# Patient Record
Sex: Male | Born: 1955
Health system: Southern US, Community
[De-identification: ages and names within clinical notes are randomized; demographics above are authoritative.]

## PROBLEM LIST (undated history)

## (undated) ENCOUNTER — Emergency Department (HOSPITAL_BASED_OUTPATIENT_CLINIC_OR_DEPARTMENT_OTHER): Payer: Medicare Other

## (undated) DIAGNOSIS — R55 Syncope and collapse: Secondary | ICD-10-CM

## (undated) DIAGNOSIS — F32A Depression, unspecified: Secondary | ICD-10-CM

## (undated) DIAGNOSIS — N399 Disorder of urinary system, unspecified: Secondary | ICD-10-CM

## (undated) DIAGNOSIS — K219 Gastro-esophageal reflux disease without esophagitis: Secondary | ICD-10-CM

## (undated) DIAGNOSIS — J189 Pneumonia, unspecified organism: Secondary | ICD-10-CM

## (undated) DIAGNOSIS — R51 Headache: Secondary | ICD-10-CM

## (undated) DIAGNOSIS — R519 Headache, unspecified: Secondary | ICD-10-CM

## (undated) DIAGNOSIS — E7800 Pure hypercholesterolemia, unspecified: Secondary | ICD-10-CM

## (undated) DIAGNOSIS — C801 Malignant (primary) neoplasm, unspecified: Secondary | ICD-10-CM

## (undated) DIAGNOSIS — IMO0002 Reserved for concepts with insufficient information to code with codable children: Secondary | ICD-10-CM

## (undated) DIAGNOSIS — I219 Acute myocardial infarction, unspecified: Secondary | ICD-10-CM

## (undated) DIAGNOSIS — N529 Male erectile dysfunction, unspecified: Secondary | ICD-10-CM

## (undated) DIAGNOSIS — E559 Vitamin D deficiency, unspecified: Secondary | ICD-10-CM

## (undated) DIAGNOSIS — I10 Essential (primary) hypertension: Secondary | ICD-10-CM

## (undated) DIAGNOSIS — IMO0001 Reserved for inherently not codable concepts without codable children: Secondary | ICD-10-CM

## (undated) DIAGNOSIS — I2510 Atherosclerotic heart disease of native coronary artery without angina pectoris: Secondary | ICD-10-CM

## (undated) HISTORY — DX: Male erectile dysfunction, unspecified: N52.9

## (undated) HISTORY — DX: Syncope and collapse: R55

## (undated) HISTORY — DX: Depression, unspecified: F32.A

## (undated) HISTORY — DX: Essential (primary) hypertension: I10

## (undated) HISTORY — PX: HERNIA REPAIR: SHX51

## (undated) HISTORY — DX: Malignant (primary) neoplasm, unspecified: C80.1

## (undated) HISTORY — DX: Disorder of urinary system, unspecified: N39.9

## (undated) HISTORY — DX: Pneumonia, unspecified organism: J18.9

## (undated) HISTORY — DX: Vitamin D deficiency, unspecified: E55.9

## (undated) HISTORY — PX: TOTAL HIP ARTHROPLASTY: SHX124

## (undated) HISTORY — DX: Reserved for concepts with insufficient information to code with codable children: IMO0002

## (undated) HISTORY — DX: Gastro-esophageal reflux disease without esophagitis: K21.9

## (undated) HISTORY — PX: APPENDECTOMY: SHX54

---

## 2000-06-22 ENCOUNTER — Encounter: Admission: RE | Admit: 2000-06-22 | Discharge: 2000-07-03 | Payer: Self-pay | Admitting: Family Medicine

## 2005-10-10 ENCOUNTER — Ambulatory Visit: Payer: Self-pay | Admitting: Cardiology

## 2005-11-09 ENCOUNTER — Ambulatory Visit (HOSPITAL_COMMUNITY): Admission: RE | Admit: 2005-11-09 | Discharge: 2005-11-09 | Payer: Self-pay | Admitting: Family Medicine

## 2007-07-06 ENCOUNTER — Ambulatory Visit (HOSPITAL_BASED_OUTPATIENT_CLINIC_OR_DEPARTMENT_OTHER): Admission: RE | Admit: 2007-07-06 | Discharge: 2007-07-06 | Payer: Self-pay | Admitting: *Deleted

## 2009-05-11 ENCOUNTER — Ambulatory Visit: Payer: Self-pay | Admitting: Gastroenterology

## 2009-05-19 ENCOUNTER — Ambulatory Visit: Payer: Self-pay | Admitting: Gastroenterology

## 2010-12-14 DIAGNOSIS — R55 Syncope and collapse: Secondary | ICD-10-CM | POA: Insufficient documentation

## 2010-12-14 DIAGNOSIS — R079 Chest pain, unspecified: Secondary | ICD-10-CM | POA: Insufficient documentation

## 2010-12-15 ENCOUNTER — Institutional Professional Consult (permissible substitution): Payer: Self-pay | Admitting: Cardiology

## 2010-12-15 DIAGNOSIS — R0989 Other specified symptoms and signs involving the circulatory and respiratory systems: Secondary | ICD-10-CM

## 2011-01-13 ENCOUNTER — Encounter: Payer: Self-pay | Admitting: Nurse Practitioner

## 2011-01-13 DIAGNOSIS — E559 Vitamin D deficiency, unspecified: Secondary | ICD-10-CM | POA: Insufficient documentation

## 2011-01-13 DIAGNOSIS — E7800 Pure hypercholesterolemia, unspecified: Secondary | ICD-10-CM | POA: Insufficient documentation

## 2011-01-13 DIAGNOSIS — N529 Male erectile dysfunction, unspecified: Secondary | ICD-10-CM

## 2011-01-13 DIAGNOSIS — F17200 Nicotine dependence, unspecified, uncomplicated: Secondary | ICD-10-CM

## 2011-01-13 DIAGNOSIS — E785 Hyperlipidemia, unspecified: Secondary | ICD-10-CM | POA: Insufficient documentation

## 2011-01-13 DIAGNOSIS — Z87891 Personal history of nicotine dependence: Secondary | ICD-10-CM | POA: Insufficient documentation

## 2011-01-13 DIAGNOSIS — I10 Essential (primary) hypertension: Secondary | ICD-10-CM

## 2011-03-08 NOTE — Op Note (Signed)
NAMECLAUDIS, Paul Murillo                ACCOUNT NO.:  [id number]   MEDICAL RECORD NO.:  [id number]          PATIENT TYPE:  AMB   LOCATION:  [facility]                         FACILITY:  [facility]   PHYSICIAN:  Alfonse Ras, MD   DATE OF BIRTH:  Feb 22, 1956   DATE OF PROCEDURE:  07/06/2007  DATE OF DISCHARGE:                               OPERATIVE REPORT   PREOPERATIVE DIAGNOSIS:  Left inguinal hernia.   POSTOPERATIVE DIAGNOSIS:  Left inguinal hernia, indirect hernia.   PROCEDURES:  Left inguinal hernia repair with mesh, Atrium mesh.   ANESTHESIA:  General.   DESCRIPTION OF PROCEDURE:  The patient was taken to the operating room  and placed in supine position.  After adequate general anesthesia was  induced using endotracheal tube, the bilateral groins were prepped and  draped in the normal sterile fashion.  Using an oblique incision over  the inguinal canal, I dissected down through the subcutaneous fat onto  the external oblique fascia.  This was opened along its fibers down to  the external ring.  The ilioinguinal nerve was identified and retracted  superiorly.  Blunt dissection identified the transversalis fascia as  well as the Cooper ligament and shelving edge of the inguinal ligament.  A large amount of preperitoneal fat along the cord was identified and  resected.  An indirect hernia sac was identified and opened.  There were  no internal contents.  It was ligated high up at the internal ring with  a pursestring 0 Surgilon suture.  The floor of Hesselbach triangle was  somewhat weak, but was reinforced with interrupted #1 Surgilon from the  transversalis fascia to [employer] ligament into the inguinal ligament.  A  piece of onlay Atrium 1 x 4 mesh was then placed over the repair, split,  and brought out lateral to the internal ring.  This was tacked in place  with a running 2-0 Prolene suture from the pubic tubercle along the  inguinal ligament lateral to the internal ring and superiorly  to the  transversalis fascia.  Ilioinguinal nerve was replaced in its normal  anatomic position.  Tissues were injected with 0.5 Marcaine.  The  external oblique fascia was closed with a running 3-0 Vicryl suture.  Skin incision was closed with staples.  Sterile dressing was applied.  The patient tolerated the procedure well and went to PACU in good  condition.      Alfonse Ras, MD  Electronically Signed     KRE/MEDQ  D:  07/06/2007  T:  07/07/2007  Job:  [id number]

## 2011-04-04 ENCOUNTER — Encounter: Payer: Self-pay | Admitting: Cardiology

## 2011-08-05 LAB — POCT I-STAT 4, (NA,K, GLUC, HGB,HCT)
Glucose, Bld: 91
HCT: 47
Hemoglobin: 16
Operator id: 114531
Potassium: 4.4
Sodium: 149 — ABNORMAL HIGH

## 2012-10-24 ENCOUNTER — Emergency Department (HOSPITAL_BASED_OUTPATIENT_CLINIC_OR_DEPARTMENT_OTHER): Payer: 59

## 2012-10-24 ENCOUNTER — Encounter (HOSPITAL_BASED_OUTPATIENT_CLINIC_OR_DEPARTMENT_OTHER): Payer: Self-pay | Admitting: *Deleted

## 2012-10-24 ENCOUNTER — Emergency Department (HOSPITAL_BASED_OUTPATIENT_CLINIC_OR_DEPARTMENT_OTHER)
Admission: EM | Admit: 2012-10-24 | Discharge: 2012-10-24 | Disposition: A | Discharge status: Home / Self Care | Payer: 59 | Attending: Emergency Medicine | Admitting: Emergency Medicine

## 2012-10-24 DIAGNOSIS — S39012A Strain of muscle, fascia and tendon of lower back, initial encounter: Secondary | ICD-10-CM

## 2012-10-24 DIAGNOSIS — Z79899 Other long term (current) drug therapy: Secondary | ICD-10-CM | POA: Insufficient documentation

## 2012-10-24 DIAGNOSIS — Z7982 Long term (current) use of aspirin: Secondary | ICD-10-CM | POA: Insufficient documentation

## 2012-10-24 DIAGNOSIS — Z87891 Personal history of nicotine dependence: Secondary | ICD-10-CM | POA: Insufficient documentation

## 2012-10-24 DIAGNOSIS — I252 Old myocardial infarction: Secondary | ICD-10-CM | POA: Insufficient documentation

## 2012-10-24 DIAGNOSIS — I2510 Atherosclerotic heart disease of native coronary artery without angina pectoris: Secondary | ICD-10-CM | POA: Insufficient documentation

## 2012-10-24 DIAGNOSIS — S335XXA Sprain of ligaments of lumbar spine, initial encounter: Secondary | ICD-10-CM | POA: Insufficient documentation

## 2012-10-24 DIAGNOSIS — E7800 Pure hypercholesterolemia, unspecified: Secondary | ICD-10-CM | POA: Insufficient documentation

## 2012-10-24 DIAGNOSIS — Y92410 Unspecified street and highway as the place of occurrence of the external cause: Secondary | ICD-10-CM | POA: Insufficient documentation

## 2012-10-24 DIAGNOSIS — Y9389 Activity, other specified: Secondary | ICD-10-CM | POA: Insufficient documentation

## 2012-10-24 DIAGNOSIS — Z9889 Other specified postprocedural states: Secondary | ICD-10-CM | POA: Insufficient documentation

## 2012-10-24 DIAGNOSIS — K219 Gastro-esophageal reflux disease without esophagitis: Secondary | ICD-10-CM | POA: Insufficient documentation

## 2012-10-24 HISTORY — DX: Pure hypercholesterolemia, unspecified: E78.00

## 2012-10-24 HISTORY — DX: Reserved for inherently not codable concepts without codable children: IMO0001

## 2012-10-24 HISTORY — DX: Acute myocardial infarction, unspecified: I21.9

## 2012-10-24 HISTORY — DX: Gastro-esophageal reflux disease without esophagitis: K21.9

## 2012-10-24 HISTORY — DX: Atherosclerotic heart disease of native coronary artery without angina pectoris: I25.10

## 2012-10-24 MED ORDER — HYDROCODONE-ACETAMINOPHEN 5-325 MG PO TABS
ORAL_TABLET | ORAL | Status: AC
  Administered 2012-10-24: 1 via ORAL
  Filled 2012-10-24: qty 1

## 2012-10-24 MED ORDER — HYDROCODONE-ACETAMINOPHEN 5-325 MG PO TABS
1.0000 | ORAL_TABLET | Freq: Once | ORAL | Status: AC
  Administered 2012-10-24: 1 via ORAL

## 2012-10-24 MED ORDER — HYDROCODONE-ACETAMINOPHEN 5-325 MG PO TABS: 2.0000 | ORAL_TABLET | ORAL | Status: DC | PRN

## 2012-10-24 NOTE — ED Provider Notes (Signed)
Medical screening examination/treatment/procedure(s) were performed by non-physician practitioner and as supervising physician I was immediately available for consultation/collaboration.   Khristian Y. Krrish Freund, MD 10/24/12 2338 
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
## 2013-05-09 ENCOUNTER — Other Ambulatory Visit: Payer: Self-pay | Admitting: Nurse Practitioner

## 2013-07-25 ENCOUNTER — Other Ambulatory Visit: Payer: Self-pay | Admitting: Family Medicine

## 2013-07-29 NOTE — Telephone Encounter (Signed)
You may refill this prescription for one time only, patient will need to make an appointment to be seen

## 2013-07-29 NOTE — Telephone Encounter (Signed)
Last seen 12/18/12  ACM

## 2013-09-15 ENCOUNTER — Other Ambulatory Visit: Payer: Self-pay | Admitting: Family Medicine

## 2013-10-11 ENCOUNTER — Other Ambulatory Visit: Payer: Self-pay | Admitting: Family Medicine

## 2013-11-19 ENCOUNTER — Ambulatory Visit (INDEPENDENT_AMBULATORY_CARE_PROVIDER_SITE_OTHER): Payer: 59 | Admitting: Family Medicine

## 2013-11-19 ENCOUNTER — Encounter: Payer: Self-pay | Admitting: Family Medicine

## 2013-11-19 VITALS — BP 133/88 | HR 73 | Temp 97.9°F | Ht 73.0 in | Wt 194.0 lb

## 2013-11-19 DIAGNOSIS — R05 Cough: Secondary | ICD-10-CM

## 2013-11-19 DIAGNOSIS — R059 Cough, unspecified: Secondary | ICD-10-CM

## 2013-11-19 DIAGNOSIS — J209 Acute bronchitis, unspecified: Secondary | ICD-10-CM

## 2013-11-19 LAB — POCT INFLUENZA A/B
Influenza A, POC: NEGATIVE
Influenza B, POC: NEGATIVE

## 2013-11-19 MED ORDER — LOSARTAN POTASSIUM-HCTZ 100-12.5 MG PO TABS: 1.0000 | ORAL_TABLET | Freq: Every day | ORAL | Status: DC

## 2013-11-19 MED ORDER — AZITHROMYCIN 250 MG PO TABS: ORAL_TABLET | ORAL | Status: DC

## 2013-11-19 MED ORDER — BENZONATATE 100 MG PO CAPS: 100.0000 mg | ORAL_CAPSULE | Freq: Three times a day (TID) | ORAL | Status: DC | PRN

## 2013-11-19 NOTE — Patient Instructions (Signed)

## 2013-11-19 NOTE — Progress Notes (Signed)
   Subjective:    Patient ID: Haynes Giannotti, male    DOB: 04-Dec-1955, 58 y.o.   MRN: [id number]  HPI  This 58 y.o. male presents for evaluation of cough and congestion and URI sx's.  Review of Systems C/o Uri sx's No chest pain, SOB, HA, dizziness, vision change, N/V, diarrhea, constipation, dysuria, urinary urgency or frequency, myalgias, arthralgias or rash.     Objective:   Physical Exam Vital signs noted  Well developed well nourished male.  HEENT - Head atraumatic Normocephalic                Eyes - PERRLA, Conjuctiva - clear Sclera- Clear EOMI                Ears - EAC's Wnl TM's Wnl Gross Hearing WNL                Nose - Nares patent                 Throat - oropharanx wnl Respiratory - Lungs CTA bilateral Cardiac - RRR S1 and S2 without murmur        Assessment & Plan:  Cough - Plan: POCT Influenza A/B, azithromycin (ZITHROMAX) 250 MG tablet, benzonatate (TESSALON PERLES) 100 MG capsule  Acute bronchitis - Plan: azithromycin (ZITHROMAX) 250 MG tablet, benzonatate (TESSALON PERLES) 100 MG . Push po fluids, rest, tylenol and motrin otc prn as directed for fever, arthralgias, and myalgias.  Follow up prn if sx's continue or persist.  Lysbeth Penner FNP

## 2013-12-20 ENCOUNTER — Encounter: Payer: 59 | Admitting: Family Medicine

## 2014-01-01 ENCOUNTER — Ambulatory Visit (INDEPENDENT_AMBULATORY_CARE_PROVIDER_SITE_OTHER): Payer: 59 | Admitting: Family Medicine

## 2014-01-01 ENCOUNTER — Ambulatory Visit (INDEPENDENT_AMBULATORY_CARE_PROVIDER_SITE_OTHER): Payer: 59

## 2014-01-01 ENCOUNTER — Encounter: Payer: Self-pay | Admitting: Family Medicine

## 2014-01-01 ENCOUNTER — Encounter (INDEPENDENT_AMBULATORY_CARE_PROVIDER_SITE_OTHER): Payer: Self-pay

## 2014-01-01 VITALS — BP 158/99 | HR 88 | Temp 98.8°F | Ht 73.0 in | Wt 199.0 lb

## 2014-01-01 DIAGNOSIS — R5383 Other fatigue: Secondary | ICD-10-CM

## 2014-01-01 DIAGNOSIS — N529 Male erectile dysfunction, unspecified: Secondary | ICD-10-CM

## 2014-01-01 DIAGNOSIS — R079 Chest pain, unspecified: Secondary | ICD-10-CM

## 2014-01-01 DIAGNOSIS — R32 Unspecified urinary incontinence: Secondary | ICD-10-CM

## 2014-01-01 DIAGNOSIS — E785 Hyperlipidemia, unspecified: Secondary | ICD-10-CM

## 2014-01-01 DIAGNOSIS — R61 Generalized hyperhidrosis: Secondary | ICD-10-CM

## 2014-01-01 DIAGNOSIS — K625 Hemorrhage of anus and rectum: Secondary | ICD-10-CM

## 2014-01-01 DIAGNOSIS — R5381 Other malaise: Secondary | ICD-10-CM

## 2014-01-01 DIAGNOSIS — Z0000 Encounter for general adult medical examination without abnormal findings: Secondary | ICD-10-CM

## 2014-01-01 DIAGNOSIS — I2510 Atherosclerotic heart disease of native coronary artery without angina pectoris: Secondary | ICD-10-CM

## 2014-01-01 LAB — POCT CBC
Granulocyte percent: 69.3 %G (ref 37–80)
HCT, POC: 41.3 % — AB (ref 43.5–53.7)
Hemoglobin: 13.5 g/dL — AB (ref 14.1–18.1)
Lymph, poc: 2.2 (ref 0.6–3.4)
MCH, POC: 31.2 pg (ref 27–31.2)
MCHC: 32.8 g/dL (ref 31.8–35.4)
MCV: 95.3 fL (ref 80–97)
MPV: 7.3 fL (ref 0–99.8)
POC Granulocyte: 6 (ref 2–6.9)
POC LYMPH PERCENT: 25 %L (ref 10–50)
Platelet Count, POC: 206 10*3/uL (ref 142–424)
RBC: 4.3 M/uL — AB (ref 4.69–6.13)
RDW, POC: 14.4 %
WBC: 8.7 10*3/uL (ref 4.6–10.2)

## 2014-01-01 LAB — POCT URINALYSIS DIPSTICK
Bilirubin, UA: NEGATIVE
Blood, UA: NEGATIVE
Glucose, UA: NEGATIVE
Ketones, UA: NEGATIVE
Leukocytes, UA: NEGATIVE
Nitrite, UA: NEGATIVE
Protein, UA: NEGATIVE
Spec Grav, UA: 1.01
Urobilinogen, UA: NEGATIVE
pH, UA: 6

## 2014-01-01 LAB — POCT UA - MICROSCOPIC ONLY
Bacteria, U Microscopic: NEGATIVE
Casts, Ur, LPF, POC: NEGATIVE
Crystals, Ur, HPF, POC: NEGATIVE
Mucus, UA: NEGATIVE
RBC, urine, microscopic: NEGATIVE
WBC, Ur, HPF, POC: NEGATIVE
Yeast, UA: NEGATIVE

## 2014-01-01 MED ORDER — TAMSULOSIN HCL 0.4 MG PO CAPS: 0.4000 mg | ORAL_CAPSULE | Freq: Every day | ORAL | Status: DC

## 2014-01-01 NOTE — Progress Notes (Signed)
Subjective:    Patient ID: Paul Murillo, male    DOB: [birth year], 58 y.o.   MRN: [id number]  HPI This 58 y.o. male presents for evaluation of CPE.  He has hx of hypertension and ED.  He  Has hx of tobacco abuse and vitamin D deficiency.  He c/o mild chest discomfort in his right chest For 2 weeks and does increase a little with activity.  He is tired all the time.  He has hx of coronary artery disease and hx of 3 coronary artery stents.  He has night sweats.  He wants to sleep all the time. He denies fever or mucopurulent productive cough.  He has ED and was taking levitra. He has some Urinary incontinence.  He gets urinary urgency and then voids a little and then urinates little afterwards. He notices some rectal bleeding.   He has BM's that are with mucous and have blood in them and These stools or rectal bleeding have been happening every 2 weeks.  He feels like he has diarrhea when he has these BM's and all that comes out is blood.   Review of Systems C/o fatigue, ED, chest pain, fatigue, night sweats, urinary urgency and incontinence.   No SOB, HA, dizziness, vision change, N/V, diarrhea, constipation,  myalgias, arthralgias or rash.  Objective:   Physical Exam  Vital signs noted  Well developed well nourished male.  HEENT - Head atraumatic Normocephalic                Eyes - PERRLA, Conjuctiva - clear Sclera- Clear EOMI                Ears - EAC's Wnl TM's Wnl Gross Hearing WNL                Nose - Nares patent                 Throat - oropharanx wnl Respiratory - Lungs CTA bilateral Cardiac - RRR S1 and S2 without murmur GI - Abdomen soft Nontender and bowel sounds active x 4 Extremities - No edema. Neuro - Grossly intact.  EKG - No acute ST-T changes  Results for orders placed in visit on 01/01/14  POCT CBC      Result Value Ref Range   WBC 8.7  4.6 - 10.2 K/uL   Lymph, poc 2.2  0.6 - 3.4   POC LYMPH PERCENT 25.0  10 - 50 %L   POC Granulocyte 6.0  2 - 6.9   Granulocyte percent 69.3  37 - 80 %G   RBC 4.3 (*) 4.69 - 6.13 M/uL   Hemoglobin 13.5 (*) 14.1 - 18.1 g/dL   HCT, POC 41.3 (*) 43.5 - 53.7 %   MCV 95.3  80 - 97 fL   MCH, POC 31.2  27 - 31.2 pg   MCHC 32.8  31.8 - 35.4 g/dL   RDW, POC 14.4     Platelet Count, POC 206.0  142 - 424 K/uL   MPV 7.3  0 - 99.8 fL  POCT UA - MICROSCOPIC ONLY      Result Value Ref Range   WBC, Ur, HPF, POC neg     RBC, urine, microscopic neg     Bacteria, U Microscopic neg     Mucus, UA neg     Epithelial cells, urine per micros occ     Crystals, Ur, HPF, POC neg     Casts, Ur, LPF, POC neg  Yeast, UA neg    POCT URINALYSIS DIPSTICK      Result Value Ref Range   Color, UA yellow     Clarity, UA clear     Glucose, UA neg     Bilirubin, UA neg     Ketones, UA neg     Spec Grav, UA 1.010     Blood, UA neg     pH, UA 6.0     Protein, UA neg     Urobilinogen, UA negative     Nitrite, UA neg     Leukocytes, UA Negative        Assessment & Plan:  Routine general medical examination at a health care facility - Plan: POCT CBC, POCT UA - Microscopic Only, POCT urinalysis dipstick, Lipid panel, Vit D  25 hydroxy (rtn osteoporosis monitoring), Vitamin B12, Thyroid Panel With TSH, PSA, total and free, Sedimentation rate, CANCELED: POCT SEDIMENTATION RATE  CAD (coronary artery disease) - Plan: EKG 12-Lead, Ambulatory referral to Cardiology  Chest pain - Plan: DG Chest 2 View, EKG 12-Lead, Ambulatory referral to Cardiology  Other malaise and fatigue - Plan: Vitamin B12, Thyroid Panel With TSH, Sedimentation rate, Testosterone,Free and Total, CANCELED: POCT SEDIMENTATION RATE  Urinary incontinence - Plan: POCT UA - Microscopic Only, POCT urinalysis dipstick, Sedimentation rate, tamsulosin (FLOMAX) 0.4 MG CAPS capsule  Night sweats - Plan: POCT CBC, DG Chest 2 View, Sedimentation rate  Other and unspecified hyperlipidemia - Plan: Lipid panel, Sedimentation rate  Rectal bleeding - Plan: POCT CBC,  Ambulatory referral to Gastroenterology  ED (erectile dysfunction) - Plan: Testosterone,Free and Total Hold on ED meds.  He takes NTG on occasion for chest pain and doesn't need To be taking ED meds at this time. Explained this to patient.  Follow up in 2 weeks to follow up  [place]

## 2014-01-02 ENCOUNTER — Ambulatory Visit (INDEPENDENT_AMBULATORY_CARE_PROVIDER_SITE_OTHER): Payer: 59 | Admitting: Cardiology

## 2014-01-02 ENCOUNTER — Encounter: Payer: Self-pay | Admitting: Cardiology

## 2014-01-02 VITALS — BP 144/90 | HR 72 | Ht 73.0 in | Wt 197.0 lb

## 2014-01-02 DIAGNOSIS — R079 Chest pain, unspecified: Secondary | ICD-10-CM

## 2014-01-02 DIAGNOSIS — I2510 Atherosclerotic heart disease of native coronary artery without angina pectoris: Secondary | ICD-10-CM

## 2014-01-02 LAB — SEDIMENTATION RATE: Sed Rate: 28 mm/hr (ref 0–30)

## 2014-01-02 NOTE — Progress Notes (Signed)
Patient ID: Paul Murillo, male   DOB: [birth year], 58 y.o.   MRN: [id number]     Patient Name: Paul Murillo Date of Encounter: 01/02/2014  Primary Care Provider:  Redge Gainer, MD Primary Cardiologist:  Dorothy Spark  Problem List   Past Medical History  Diagnosis Date  . Coronary artery disease   . MI (myocardial infarction)   . Hypercholesteremia   . Reflux   . Hypertension   . Syncope   . Genitourinary disorder   . ED (erectile dysfunction)   . Vitamin D deficiency    Past Surgical History  Procedure Laterality Date  . Total hip arthroplasty    . Appendectomy    . Hernia repair      Allergies  No Known Allergies  HPI  A pleasant 58 year old gentleman with prior medical history of hypertension, hyperlipidemia and known coronary artery disease with myocardial infarction approximately 2 years ago. The patient was hospitalized at that time at [facility] and based on his report he received 3 stents, but he has no further information. He has been very compliant to his medication and to this date he is on dual antiplatelet therapy with aspirin and ticagrelor. He states that he has been asymptomatic since then until about 2 weeks ago when he started to experience profound fatigue and right-sided chest pain that can be exertional or nonexertional. He also admits to occasional palpitations that last about 10 minutes and can be associated with mild dizziness but no shortness of breath. He otherwise denies shortness of breath, orthopnea, paroxysmal nocturnal dyspnea or lower extremity edema. Since his stents placement he has been followed by his family physician we will also monitors his lipids he is currently on 40 mg of atorvastatin.  Home Medications  Prior to Admission medications   Medication Sig Start Date End Date Taking? Authorizing Provider  aspirin 81 MG EC tablet Take 81 mg by mouth daily.     Yes Historical Provider, MD  atorvastatin (LIPITOR) 40 MG  tablet Take 80 mg by mouth daily.    Yes Historical Provider, MD  losartan-hydrochlorothiazide (HYZAAR) 100-12.5 MG per tablet Take 1 tablet by mouth daily. 11/19/13  Yes Lysbeth Penner, FNP  NITROGLYCERIN SL Place under the tongue.   Yes Historical Provider, MD  pantoprazole (PROTONIX) 40 MG tablet Take 40 mg by mouth daily.   Yes Historical Provider, MD  prasugrel (EFFIENT) 10 MG TABS Take by mouth.   Yes Historical Provider, MD  vardenafil (LEVITRA) 20 MG tablet Take 20 mg by mouth daily as needed.     Yes Historical Provider, MD  tamsulosin (FLOMAX) 0.4 MG CAPS capsule Take 1 capsule (0.4 mg total) by mouth daily. 01/01/14   Lysbeth Penner, FNP    Family History  Family History  Problem Relation Age of Onset  . Heart disease Father   . Hypertension Father   . Diabetes Brother     Social History  History   Social History  . Marital Status: Married    Spouse Name: N/A    Number of Children: N/A  . Years of Education: N/A   Occupational History  . Not on file.   Social History Main Topics  . Smoking status: Former Research scientist (life sciences)  . Smokeless tobacco: Not on file  . Alcohol Use: Yes  . Drug Use: No  . Sexual Activity: Not on file   Other Topics Concern  . Not on file   Social History Narrative  . No narrative  on file     Review of Systems, as per HPI, otherwise negative General:  No chills, fever, night sweats or weight changes.  Cardiovascular:  No chest pain, dyspnea on exertion, edema, orthopnea, palpitations, paroxysmal nocturnal dyspnea. Dermatological: No rash, lesions/masses Respiratory: No cough, dyspnea Urologic: No hematuria, dysuria Abdominal:   No nausea, vomiting, diarrhea, bright red blood per rectum, melena, or hematemesis Neurologic:  No visual changes, wkns, changes in mental status. All other systems reviewed and are otherwise negative except as noted above.  Physical Exam  Blood pressure 144/90, pulse 72, height 6\' 1"  (1.854 m), weight 197 lb  (89.359 kg).  General: Pleasant, NAD Psych: Normal affect. Neuro: Alert and oriented X 3. Moves all extremities spontaneously. HEENT: Normal  Neck: Supple without bruits or JVD. Lungs:  Resp regular and unlabored, CTA. Heart: RRR no s3, s4, or murmurs. Abdomen: Soft, non-tender, non-distended, BS + x 4.  Extremities: No clubbing, cyanosis or edema. DP/PT/Radials 2+ and equal bilaterally.  Labs:  No results found for this basename: CKTOTAL, CKMB, TROPONINI,  in the last 72 hours Lab Results  Component Value Date   WBC 8.7 01/01/2014   HGB 13.5* 01/01/2014   HCT 41.3* 01/01/2014   MCV 95.3 01/01/2014   No results found for this basename: NA, K, CL, CO2, BUN, CREATININE, CALCIUM, LABALBU, PROT, BILITOT, ALKPHOS, ALT, AST, GLUCOSE,  in the last 168 hours No results found for this basename: CHOL, HDL, LDLCALC, TRIG   No results found for this basename: DDIMER   No components found with this basename: POCBNP,   Accessory Clinical Findings  Echocardiogram - none  ECG - sinus rhythm, nonspecific T wave abnormalities  CXR 01/01/14 COMPARISON DG CHEST 2V dated 12/04/2012  FINDINGS The heart size and mediastinal contours are within normal limits. Both lungs are clear. The visualized skeletal structures are unremarkable.  IMPRESSION No active cardiopulmonary disease.   Assessment & Plan  58 year old gentleman with known CAD, hypertension hyperlipidemia  1. fatigue and chest pain with some typical and some atypical features - considering his prior intervention, risk factors and not completely normal EKG we will proceed with exercise nuclear stress test.  2. Hypertension - borderline elevated systolic and diastolic pressures today. Since this is the first visit we won't change his medications. We will assess he's blood pressure during stress test at baseline and during exertion and if additional medication is needed inclined to start Bystolic as he has COPD.  3. Hyperlipidemia -  followed by his family physician checked yesterday, we'll try to obtain results  4. Erectal dysfunction - on Vardenafil  5. COPD - currently 3 cigarettes/day, working on quitting  Follow up in 4 weeks.   Dorothy Spark, MD, [facility] 01/02/2014, 9:01 AM

## 2014-01-02 NOTE — Patient Instructions (Signed)
Your physician has requested that you have en exercise stress myoview. For further information please visit [email or web address]. Please follow instruction sheet, as given.  Your physician recommends that you schedule a follow-up appointment after stress test with Dr. Meda Coffee.

## 2014-01-03 ENCOUNTER — Encounter: Payer: Self-pay | Admitting: Gastroenterology

## 2014-01-03 LAB — LIPID PANEL
Chol/HDL Ratio: 3.7 ratio units (ref 0.0–5.0)
Cholesterol, Total: 217 mg/dL — ABNORMAL HIGH (ref 100–199)
HDL: 58 mg/dL (ref >39)
LDL Calculated: 139 mg/dL — ABNORMAL HIGH (ref 0–99)
Triglycerides: 98 mg/dL (ref 0–149)
VLDL Cholesterol Cal: 20 mg/dL (ref 5–40)

## 2014-01-03 LAB — THYROID PANEL WITH TSH
Free Thyroxine Index: 2 (ref 1.2–4.9)
T3 Uptake Ratio: 27 % (ref 24–39)
T4, Total: 7.4 ug/dL (ref 4.5–12.0)
TSH: 3.26 u[IU]/mL (ref 0.450–4.500)

## 2014-01-03 LAB — PSA, TOTAL AND FREE
PSA, Free Pct: 17.2 %
PSA, Free: 0.74 ng/mL
PSA: 4.3 ng/mL — ABNORMAL HIGH (ref 0.0–4.0)

## 2014-01-03 LAB — VITAMIN D 25 HYDROXY (VIT D DEFICIENCY, FRACTURES): Vit D, 25-Hydroxy: 8.2 ng/mL — ABNORMAL LOW (ref 30.0–100.0)

## 2014-01-03 LAB — TESTOSTERONE,FREE AND TOTAL
Testosterone, Free: 9.1 pg/mL (ref 7.2–24.0)
Testosterone: 381 ng/dL (ref 348–1197)

## 2014-01-03 LAB — VITAMIN B12: Vitamin B-12: 1741 pg/mL — ABNORMAL HIGH (ref 211–946)

## 2014-01-06 ENCOUNTER — Other Ambulatory Visit: Payer: Self-pay | Admitting: Family Medicine

## 2014-01-06 DIAGNOSIS — E559 Vitamin D deficiency, unspecified: Secondary | ICD-10-CM

## 2014-01-06 DIAGNOSIS — R972 Elevated prostate specific antigen [PSA]: Secondary | ICD-10-CM

## 2014-01-06 MED ORDER — VITAMIN D (ERGOCALCIFEROL) 1.25 MG (50000 UNIT) PO CAPS: 50000.0000 [IU] | ORAL_CAPSULE | ORAL | Status: DC

## 2014-01-17 ENCOUNTER — Ambulatory Visit: Payer: 59 | Admitting: Family Medicine

## 2014-01-21 ENCOUNTER — Ambulatory Visit (HOSPITAL_COMMUNITY): Payer: 59 | Attending: Cardiology | Admitting: Radiology

## 2014-01-21 VITALS — BP 136/93 | HR 53 | Ht 72.0 in | Wt 195.0 lb

## 2014-01-21 DIAGNOSIS — R0789 Other chest pain: Secondary | ICD-10-CM | POA: Insufficient documentation

## 2014-01-21 DIAGNOSIS — R079 Chest pain, unspecified: Secondary | ICD-10-CM

## 2014-01-21 DIAGNOSIS — R002 Palpitations: Secondary | ICD-10-CM | POA: Insufficient documentation

## 2014-01-21 DIAGNOSIS — R42 Dizziness and giddiness: Secondary | ICD-10-CM | POA: Insufficient documentation

## 2014-01-21 DIAGNOSIS — Z87891 Personal history of nicotine dependence: Secondary | ICD-10-CM | POA: Insufficient documentation

## 2014-01-21 DIAGNOSIS — Z9861 Coronary angioplasty status: Secondary | ICD-10-CM | POA: Insufficient documentation

## 2014-01-21 DIAGNOSIS — Z8249 Family history of ischemic heart disease and other diseases of the circulatory system: Secondary | ICD-10-CM | POA: Insufficient documentation

## 2014-01-21 DIAGNOSIS — R5383 Other fatigue: Secondary | ICD-10-CM

## 2014-01-21 DIAGNOSIS — R5381 Other malaise: Secondary | ICD-10-CM | POA: Insufficient documentation

## 2014-01-21 DIAGNOSIS — I10 Essential (primary) hypertension: Secondary | ICD-10-CM | POA: Insufficient documentation

## 2014-01-21 DIAGNOSIS — J4489 Other specified chronic obstructive pulmonary disease: Secondary | ICD-10-CM | POA: Insufficient documentation

## 2014-01-21 DIAGNOSIS — J449 Chronic obstructive pulmonary disease, unspecified: Secondary | ICD-10-CM | POA: Insufficient documentation

## 2014-01-21 DIAGNOSIS — I2510 Atherosclerotic heart disease of native coronary artery without angina pectoris: Secondary | ICD-10-CM | POA: Insufficient documentation

## 2014-01-21 MED ORDER — TECHNETIUM TC 99M SESTAMIBI GENERIC - CARDIOLITE
30.0000 | Freq: Once | INTRAVENOUS | Status: AC | PRN
  Administered 2014-01-21: 30 via INTRAVENOUS

## 2014-01-21 MED ORDER — TECHNETIUM TC 99M SESTAMIBI GENERIC - CARDIOLITE
10.0000 | Freq: Once | INTRAVENOUS | Status: AC | PRN
  Administered 2014-01-21: 10 via INTRAVENOUS

## 2014-01-21 NOTE — Progress Notes (Signed)
[address] [place], [place] [address] [phone]    Cardiology Nuclear Med Study  Paul Murillo is a 58 y.o. male     MRN : [id number]     DOB: 13-May-1956  Procedure Date: 01/21/2014  Nuclear Med Background Indication for Stress Test:  Evaluation for Ischemia and Abnormal EKG History:  CAD; Multiple stents; Asthma; COPD Cardiac Risk Factors: Family History - CAD, History of Smoking, Hypertension and Lipids  Symptoms:  Chest Pain (last date of chest discomfort was yesterday), Dizziness, Fatigue and Palpitations   Nuclear Pre-Procedure Caffeine/Decaff Intake:  None NPO After: 2:00pm   Lungs:  clear O2 Sat: 98% on room air. IV 0.9% NS with Angio Cath:  20g  IV Site: R Hand  IV Started by:  Matilde Haymaker, RN  Chest Size (in):  44 Cup Size: n/a  Height: 6' (1.829 m)  Weight:  195 lb (88.451 kg)  BMI:  Body mass index is 26.44 kg/(m^2). Tech Comments:  n/a    Nuclear Med Study 1 or 2 day study: 1 day  Stress Test Type:  Stress  Reading MD: K. Mal[place] Lonni Dirden, MD  Order Authorizing Provider:  Liane Comber, MD  Resting Radionuclide: Technetium 79m Sestamibi  Resting Radionuclide Dose: 11.0 mCi   Stress Radionuclide:  Technetium 42m Sestamibi  Stress Radionuclide Dose: 33.0 mCi           Stress Protocol Rest HR: 53 Stress HR: 148  Rest BP: 136/93 Stress BP: 185/75  Exercise Time (min): 7:32 METS: 9.3           Dose of Adenosine (mg):  n/a Dose of Lexiscan: n/a mg  Dose of Atropine (mg): n/a Dose of Dobutamine: n/a mcg/kg/min (at max HR)  Stress Test Technologist: Glade Lloyd, BS-ES  Nuclear Technologist:  Charlton Amor, CNMT     Rest Procedure:  Myocardial perfusion imaging was performed at rest 45 minutes following the intravenous administration of Technetium 35m Sestamibi. Rest ECG: NSR - Normal EKG  Stress Procedure:  The patient exercised on the treadmill utilizing the Bruce Protocol for 7:32 minutes. The patient stopped  due to leg fatigue and denied any chest pain.  Technetium 54m Sestamibi was injected at peak exercise and myocardial perfusion imaging was performed after a brief delay. Stress ECG: No significant ST segment change suggestive of ischemia.  QPS Raw Data Images:  Normal; no motion artifact; normal heart/lung ratio. Stress Images:  There is decreased uptake in the anterior wall. Rest Images:  There is decreased uptake in the apex. Subtraction (SDS):  These findings are consistent with ischemia. Transient Ischemic Dilatation (Normal <1.22):  0.98 Lung/Heart Ratio (Normal <0.45):  0.41  Quantitative Gated Spect Images QGS EDV:  103 ml QGS ESV:  46 ml  Impression Exercise Capacity:  Fair exercise capacity. BP Response:  Normal blood pressure response. Clinical Symptoms:  Leg fatigue ECG Impression:  No significant ST segment change suggestive of ischemia. Comparison with Prior Nuclear Study: No previous nuclear study performed  Overall Impression:  Intermediate risk stress nuclear study with moderate-sized (Extent 9%, SDS 5), reversible mid to distal anteroapical defect.   LV Ejection Fraction: 56%.  LV Wall Motion:  Normal Wall Motion  Pixie Casino, MD, [facility] Board Certified in Nuclear Cardiology Attending Cardiologist [place]

## 2014-01-23 ENCOUNTER — Other Ambulatory Visit: Payer: Self-pay

## 2014-01-23 ENCOUNTER — Telehealth: Payer: Self-pay | Admitting: Cardiology

## 2014-01-23 DIAGNOSIS — Z01812 Encounter for preprocedural laboratory examination: Secondary | ICD-10-CM

## 2014-01-23 DIAGNOSIS — R9439 Abnormal result of other cardiovascular function study: Secondary | ICD-10-CM

## 2014-01-23 NOTE — Addendum Note (Signed)
Addended by: Dorothy Spark on: 01/23/2014 03:30 PM   Modules accepted: Orders

## 2014-01-23 NOTE — Telephone Encounter (Signed)
New message ° ° ° ° °Returned a nurses call °

## 2014-01-27 ENCOUNTER — Encounter (HOSPITAL_COMMUNITY): Payer: Self-pay | Admitting: Pharmacy Technician

## 2014-01-27 ENCOUNTER — Other Ambulatory Visit (INDEPENDENT_AMBULATORY_CARE_PROVIDER_SITE_OTHER): Payer: 59

## 2014-01-27 DIAGNOSIS — Z01812 Encounter for preprocedural laboratory examination: Secondary | ICD-10-CM

## 2014-01-27 DIAGNOSIS — R9439 Abnormal result of other cardiovascular function study: Secondary | ICD-10-CM

## 2014-01-27 LAB — BASIC METABOLIC PANEL
BUN: 14 mg/dL (ref 6–23)
CO2: 27 mEq/L (ref 19–32)
Calcium: 9.5 mg/dL (ref 8.4–10.5)
Chloride: 104 mEq/L (ref 96–112)
Creatinine, Ser: 1.3 mg/dL (ref 0.4–1.5)
GFR: 74.46 mL/min (ref >60.00)
Glucose, Bld: 115 mg/dL — ABNORMAL HIGH (ref 70–99)
Potassium: 4.3 mEq/L (ref 3.5–5.1)
Sodium: 138 mEq/L (ref 135–145)

## 2014-01-27 LAB — CBC WITH DIFFERENTIAL/PLATELET
Basophils Absolute: 0 10*3/uL (ref 0.0–0.1)
Basophils Relative: 0.5 % (ref 0.0–3.0)
Eosinophils Absolute: 0.5 10*3/uL (ref 0.0–0.7)
Eosinophils Relative: 5.9 % — ABNORMAL HIGH (ref 0.0–5.0)
HCT: 40.6 % (ref 39.0–52.0)
Hemoglobin: 13.5 g/dL (ref 13.0–17.0)
Lymphocytes Relative: 35.7 % (ref 12.0–46.0)
Lymphs Abs: 2.8 10*3/uL (ref 0.7–4.0)
MCHC: 33.2 g/dL (ref 30.0–36.0)
MCV: 95.7 fl (ref 78.0–100.0)
Monocytes Absolute: 0.6 10*3/uL (ref 0.1–1.0)
Monocytes Relative: 7.4 % (ref 3.0–12.0)
Neutro Abs: 4 10*3/uL (ref 1.4–7.7)
Neutrophils Relative %: 50.5 % (ref 43.0–77.0)
Platelets: 216 10*3/uL (ref 150.0–400.0)
RBC: 4.24 Mil/uL (ref 4.22–5.81)
RDW: 14.8 % — ABNORMAL HIGH (ref 11.5–14.6)
WBC: 8 10*3/uL (ref 4.5–10.5)

## 2014-01-27 LAB — PROTIME-INR
INR: 1 ratio (ref 0.8–1.0)
Prothrombin Time: 10.1 s — ABNORMAL LOW (ref 10.2–12.4)

## 2014-01-29 ENCOUNTER — Ambulatory Visit (HOSPITAL_COMMUNITY)
Admission: RE | Admit: 2014-01-29 | Discharge: 2014-01-29 | Disposition: A | Discharge status: Home / Self Care | Payer: 59 | Source: Ambulatory Visit | Attending: Cardiology | Admitting: Cardiology

## 2014-01-29 ENCOUNTER — Encounter (HOSPITAL_COMMUNITY)
Admission: RE | Disposition: A | Discharge status: Home / Self Care | Payer: Self-pay | Source: Ambulatory Visit | Attending: Cardiology

## 2014-01-29 DIAGNOSIS — E559 Vitamin D deficiency, unspecified: Secondary | ICD-10-CM | POA: Insufficient documentation

## 2014-01-29 DIAGNOSIS — Z96649 Presence of unspecified artificial hip joint: Secondary | ICD-10-CM | POA: Insufficient documentation

## 2014-01-29 DIAGNOSIS — F17200 Nicotine dependence, unspecified, uncomplicated: Secondary | ICD-10-CM | POA: Insufficient documentation

## 2014-01-29 DIAGNOSIS — E785 Hyperlipidemia, unspecified: Secondary | ICD-10-CM | POA: Insufficient documentation

## 2014-01-29 DIAGNOSIS — K219 Gastro-esophageal reflux disease without esophagitis: Secondary | ICD-10-CM | POA: Insufficient documentation

## 2014-01-29 DIAGNOSIS — N529 Male erectile dysfunction, unspecified: Secondary | ICD-10-CM | POA: Insufficient documentation

## 2014-01-29 DIAGNOSIS — I10 Essential (primary) hypertension: Secondary | ICD-10-CM | POA: Insufficient documentation

## 2014-01-29 DIAGNOSIS — J449 Chronic obstructive pulmonary disease, unspecified: Secondary | ICD-10-CM | POA: Insufficient documentation

## 2014-01-29 DIAGNOSIS — E7800 Pure hypercholesterolemia, unspecified: Secondary | ICD-10-CM | POA: Insufficient documentation

## 2014-01-29 DIAGNOSIS — J4489 Other specified chronic obstructive pulmonary disease: Secondary | ICD-10-CM | POA: Insufficient documentation

## 2014-01-29 DIAGNOSIS — I252 Old myocardial infarction: Secondary | ICD-10-CM | POA: Insufficient documentation

## 2014-01-29 DIAGNOSIS — Z7982 Long term (current) use of aspirin: Secondary | ICD-10-CM | POA: Insufficient documentation

## 2014-01-29 DIAGNOSIS — Z9861 Coronary angioplasty status: Secondary | ICD-10-CM | POA: Insufficient documentation

## 2014-01-29 DIAGNOSIS — I2510 Atherosclerotic heart disease of native coronary artery without angina pectoris: Secondary | ICD-10-CM

## 2014-01-29 DIAGNOSIS — R079 Chest pain, unspecified: Secondary | ICD-10-CM

## 2014-01-29 HISTORY — PX: LEFT HEART CATHETERIZATION WITH CORONARY ANGIOGRAM: SHX5451

## 2014-01-29 SURGERY — LEFT HEART CATHETERIZATION WITH CORONARY ANGIOGRAM
Anesthesia: LOCAL

## 2014-01-29 MED ORDER — SODIUM CHLORIDE 0.9 % IV SOLN
INTRAVENOUS | Status: DC
  Administered 2014-01-29: 250 mL via INTRAVENOUS

## 2014-01-29 MED ORDER — HEPARIN (PORCINE) IN NACL 2-0.9 UNIT/ML-% IJ SOLN
INTRAMUSCULAR | Status: AC
  Filled 2014-01-29: qty 1000

## 2014-01-29 MED ORDER — FENTANYL CITRATE 0.05 MG/ML IJ SOLN
INTRAMUSCULAR | Status: AC
  Filled 2014-01-29: qty 2

## 2014-01-29 MED ORDER — SODIUM CHLORIDE 0.9 % IV SOLN: 250.0000 mL | INTRAVENOUS | Status: DC | PRN

## 2014-01-29 MED ORDER — SODIUM CHLORIDE 0.9 % IJ SOLN: 3.0000 mL | Freq: Two times a day (BID) | INTRAMUSCULAR | Status: DC

## 2014-01-29 MED ORDER — SODIUM CHLORIDE 0.9 % IJ SOLN
3.0000 mL | INTRAMUSCULAR | Status: DC | PRN
Start: 2014-01-29 — End: 2014-01-29

## 2014-01-29 MED ORDER — NITROGLYCERIN 0.2 MG/ML ON CALL CATH LAB
INTRAVENOUS | Status: AC
  Filled 2014-01-29: qty 1

## 2014-01-29 MED ORDER — MIDAZOLAM HCL 2 MG/2ML IJ SOLN
INTRAMUSCULAR | Status: AC
  Filled 2014-01-29: qty 2

## 2014-01-29 MED ORDER — ASPIRIN 81 MG PO CHEW
81.0000 mg | CHEWABLE_TABLET | ORAL | Status: AC
  Administered 2014-01-29: 81 mg via ORAL
  Filled 2014-01-29: qty 1

## 2014-01-29 MED ORDER — VERAPAMIL HCL 2.5 MG/ML IV SOLN
INTRAVENOUS | Status: AC
  Filled 2014-01-29: qty 2

## 2014-01-29 MED ORDER — LIDOCAINE HCL (PF) 1 % IJ SOLN
INTRAMUSCULAR | Status: AC
  Filled 2014-01-29: qty 30

## 2014-01-29 MED ORDER — SODIUM CHLORIDE 0.9 % IV SOLN: 1.0000 mL/kg/h | INTRAVENOUS | Status: DC

## 2014-01-29 NOTE — H&P (View-Only) (Signed)
Patient ID: Obbie Lewallen, male   DOB: [birth year], 58 y.o.   MRN: [id number]     Patient Name: Paul Murillo Date of Encounter: 01/02/2014  Primary Care Provider:  Redge Gainer, MD Primary Cardiologist:  Dorothy Spark  Problem List   Past Medical History  Diagnosis Date  . Coronary artery disease   . MI (myocardial infarction)   . Hypercholesteremia   . Reflux   . Hypertension   . Syncope   . Genitourinary disorder   . ED (erectile dysfunction)   . Vitamin D deficiency    Past Surgical History  Procedure Laterality Date  . Total hip arthroplasty    . Appendectomy    . Hernia repair      Allergies  No Known Allergies  HPI  A pleasant 58 year old gentleman with prior medical history of hypertension, hyperlipidemia and known coronary artery disease with myocardial infarction approximately 2 years ago. The patient was hospitalized at that time at [facility] and based on his report he received 3 stents, but he has no further information. He has been very compliant to his medication and to this date he is on dual antiplatelet therapy with aspirin and ticagrelor. He states that he has been asymptomatic since then until about 2 weeks ago when he started to experience profound fatigue and right-sided chest pain that can be exertional or nonexertional. He also admits to occasional palpitations that last about 10 minutes and can be associated with mild dizziness but no shortness of breath. He otherwise denies shortness of breath, orthopnea, paroxysmal nocturnal dyspnea or lower extremity edema. Since his stents placement he has been followed by his family physician we will also monitors his lipids he is currently on 40 mg of atorvastatin.  Home Medications  Prior to Admission medications   Medication Sig Start Date End Date Taking? Authorizing Provider  aspirin 81 MG EC tablet Take 81 mg by mouth daily.     Yes Historical Provider, MD  atorvastatin (LIPITOR) 40 MG  tablet Take 80 mg by mouth daily.    Yes Historical Provider, MD  losartan-hydrochlorothiazide (HYZAAR) 100-12.5 MG per tablet Take 1 tablet by mouth daily. 11/19/13  Yes Lysbeth Penner, FNP  NITROGLYCERIN SL Place under the tongue.   Yes Historical Provider, MD  pantoprazole (PROTONIX) 40 MG tablet Take 40 mg by mouth daily.   Yes Historical Provider, MD  prasugrel (EFFIENT) 10 MG TABS Take by mouth.   Yes Historical Provider, MD  vardenafil (LEVITRA) 20 MG tablet Take 20 mg by mouth daily as needed.     Yes Historical Provider, MD  tamsulosin (FLOMAX) 0.4 MG CAPS capsule Take 1 capsule (0.4 mg total) by mouth daily. 01/01/14   Lysbeth Penner, FNP    Family History  Family History  Problem Relation Age of Onset  . Heart disease Father   . Hypertension Father   . Diabetes Brother     Social History  History   Social History  . Marital Status: Married    Spouse Name: N/A    Number of Children: N/A  . Years of Education: N/A   Occupational History  . Not on file.   Social History Main Topics  . Smoking status: Former Research scientist (life sciences)  . Smokeless tobacco: Not on file  . Alcohol Use: Yes  . Drug Use: No  . Sexual Activity: Not on file   Other Topics Concern  . Not on file   Social History Narrative  . No narrative  on file     Review of Systems, as per HPI, otherwise negative General:  No chills, fever, night sweats or weight changes.  Cardiovascular:  No chest pain, dyspnea on exertion, edema, orthopnea, palpitations, paroxysmal nocturnal dyspnea. Dermatological: No rash, lesions/masses Respiratory: No cough, dyspnea Urologic: No hematuria, dysuria Abdominal:   No nausea, vomiting, diarrhea, bright red blood per rectum, melena, or hematemesis Neurologic:  No visual changes, wkns, changes in mental status. All other systems reviewed and are otherwise negative except as noted above.  Physical Exam  Blood pressure 144/90, pulse 72, height 6\' 1"  (1.854 m), weight 197 lb  (89.359 kg).  General: Pleasant, NAD Psych: Normal affect. Neuro: Alert and oriented X 3. Moves all extremities spontaneously. HEENT: Normal  Neck: Supple without bruits or JVD. Lungs:  Resp regular and unlabored, CTA. Heart: RRR no s3, s4, or murmurs. Abdomen: Soft, non-tender, non-distended, BS + x 4.  Extremities: No clubbing, cyanosis or edema. DP/PT/Radials 2+ and equal bilaterally.  Labs:  No results found for this basename: CKTOTAL, CKMB, TROPONINI,  in the last 72 hours Lab Results  Component Value Date   WBC 8.7 01/01/2014   HGB 13.5* 01/01/2014   HCT 41.3* 01/01/2014   MCV 95.3 01/01/2014   No results found for this basename: NA, K, CL, CO2, BUN, CREATININE, CALCIUM, LABALBU, PROT, BILITOT, ALKPHOS, ALT, AST, GLUCOSE,  in the last 168 hours No results found for this basename: CHOL, HDL, LDLCALC, TRIG   No results found for this basename: DDIMER   No components found with this basename: POCBNP,   Accessory Clinical Findings  Echocardiogram - none  ECG - sinus rhythm, nonspecific T wave abnormalities  CXR 01/01/14 COMPARISON DG CHEST 2V dated 12/04/2012  FINDINGS The heart size and mediastinal contours are within normal limits. Both lungs are clear. The visualized skeletal structures are unremarkable.  IMPRESSION No active cardiopulmonary disease.   Assessment & Plan  58 year old gentleman with known CAD, hypertension hyperlipidemia  1. fatigue and chest pain with some typical and some atypical features - considering his prior intervention, risk factors and not completely normal EKG we will proceed with exercise nuclear stress test.  2. Hypertension - borderline elevated systolic and diastolic pressures today. Since this is the first visit we won't change his medications. We will assess he's blood pressure during stress test at baseline and during exertion and if additional medication is needed inclined to start Bystolic as he has COPD.  3. Hyperlipidemia -  followed by his family physician checked yesterday, we'll try to obtain results  4. Erectal dysfunction - on Vardenafil  5. COPD - currently 3 cigarettes/day, working on quitting  Follow up in 4 weeks.   Dorothy Spark, MD, [facility] 01/02/2014, 9:01 AM

## 2014-01-29 NOTE — Interval H&P Note (Signed)
History and Physical Interval Note:  01/29/2014 8:43 AM  Paul Murillo  has presented today for surgery, with the diagnosis of adnormal stress test  The various methods of treatment have been discussed with the patient and family. After consideration of risks, benefits and other options for treatment, the patient has consented to  Procedure(s): LEFT HEART CATHETERIZATION WITH CORONARY ANGIOGRAM (N/A) as a surgical intervention .  The patient's history has been reviewed, patient examined, no change in status, stable for surgery.  I have reviewed the patient's chart and labs.  Questions were answered to the patient's satisfaction.   Cath Lab Visit (complete for each Cath Lab visit)  Clinical Evaluation Leading to the Procedure:   ACS: no  Non-ACS:    Anginal Classification: CCS IV  Anti-ischemic medical therapy: No Therapy  Non-Invasive Test Results: Intermediate-risk stress test findings: cardiac mortality 1-3%/year  Prior CABG: No previous CABG        [name] [facility] 01/29/2014 8:44 AM

## 2014-01-29 NOTE — Discharge Instructions (Signed)

## 2014-01-29 NOTE — Progress Notes (Signed)
D/C home wit wife; right radial site unremarkable; no c/o

## 2014-01-29 NOTE — CV Procedure (Signed)
    Cardiac Catheterization Procedure Note  Name: Paul Murillo MRN: [id number] DOB: 1956/05/17  Procedure: Left Heart Cath, Selective Coronary Angiography, LV angiography  Indication: 58 yo BM with history of prior MI with DES of the proximal to mid LAD presents now with chest pain. Myoview study is intermediate risk with anteroapical ischemia.   Procedural Details: The right wrist was prepped, draped, and anesthetized with 1% lidocaine. Using the modified Seldinger technique, a 5 French sheath was introduced into the right radial artery. 3 mg of verapamil was administered through the sheath, weight-based unfractionated heparin was administered intravenously. Standard Judkins catheters were used for selective coronary angiography and left ventriculography. Catheter exchanges were performed over an exchange length guidewire. There were no immediate procedural complications. A TR band was used for radial hemostasis at the completion of the procedure.  The patient was transferred to the post catheterization recovery area for further monitoring.  Procedural Findings: Hemodynamics: AO 101/61 mean 78 mm Hg LV 102/5 mm Hg  Coronary angiography: Coronary dominance: right  Left mainstem: Normal  Left anterior descending (LAD): The proximal to mid LAD is stented. The stents are widely patent. No other disease in the LAD  Left circumflex (LCx): There is 30% stenosis in the proximal vessel and 40% eccentric disease in the mid vessel.  Right coronary artery (RCA): 40% distal RCA. The PDA is small with 50-60% mid vessel disease.  Left ventriculography: Left ventricular systolic function is abnormal, there is focal apical akinesis. LVEF is estimated at 50%, there is no significant mitral regurgitation   Final Conclusions:   1. Nonobstructive CAD, the LAD stents are widely patent.   Recommendations: medical management.  [name] [name] 01/29/2014, 9:27 AM

## 2014-02-07 ENCOUNTER — Encounter: Payer: Self-pay | Admitting: Cardiology

## 2014-02-07 ENCOUNTER — Ambulatory Visit (INDEPENDENT_AMBULATORY_CARE_PROVIDER_SITE_OTHER): Payer: 59 | Admitting: Cardiology

## 2014-02-07 VITALS — BP 128/76 | HR 84 | Ht 73.0 in | Wt 198.4 lb

## 2014-02-07 DIAGNOSIS — I2510 Atherosclerotic heart disease of native coronary artery without angina pectoris: Secondary | ICD-10-CM | POA: Insufficient documentation

## 2014-02-07 DIAGNOSIS — Z9861 Coronary angioplasty status: Secondary | ICD-10-CM

## 2014-02-07 MED ORDER — RANOLAZINE ER 500 MG PO TB12: 500.0000 mg | ORAL_TABLET | Freq: Two times a day (BID) | ORAL | Status: DC

## 2014-02-07 NOTE — Progress Notes (Signed)
Patient ID: Paul Murillo, male   DOB: 11-Nov-1955, 58 y.o.   MRN: [id number]     Patient Name: Paul Murillo Date of Encounter: 02/07/2014  Primary Care Provider:  Redge Gainer, MD Primary Cardiologist:  Dorothy Spark  Problem List   Past Medical History  Diagnosis Date  . Coronary artery disease   . MI (myocardial infarction)   . Hypercholesteremia   . Reflux   . Hypertension   . Syncope   . Genitourinary disorder   . ED (erectile dysfunction)   . Vitamin D deficiency    Past Surgical History  Procedure Laterality Date  . Total hip arthroplasty    . Appendectomy    . Hernia repair      Allergies  No Known Allergies  HPI  A pleasant 58 year old gentleman with prior medical history of hypertension, hyperlipidemia and known coronary artery disease with myocardial infarction approximately 2 years ago. The patient was hospitalized at that time at [facility] and based on his report he received 3 stents, but he has no further information. He has been very compliant to his medication and to this date he is on dual antiplatelet therapy with aspirin and ticagrelor. He states that he has been asymptomatic since then until about 2 weeks ago when he started to experience profound fatigue and right-sided chest pain that can be exertional or nonexertional. He also admits to occasional palpitations that last about 10 minutes and can be associated with mild dizziness but no shortness of breath. He otherwise denies shortness of breath, orthopnea, paroxysmal nocturnal dyspnea or lower extremity edema. Since his stents placement he has been followed by his family physician we will also monitors his lipids he is currently on 40 mg of atorvastatin.  The patient had a positive stress test in mid and distal inferolateral territory, cardiac cath showed non-obstructive CAD. He continues having CP, however hasn't used NTG as they "were not so bad".  Home Medications  Prior to  Admission medications   Medication Sig Start Date End Date Taking? Authorizing Provider  aspirin 81 MG EC tablet Take 81 mg by mouth daily.     Yes Historical Provider, MD  atorvastatin (LIPITOR) 40 MG tablet Take 80 mg by mouth daily.    Yes Historical Provider, MD  losartan-hydrochlorothiazide (HYZAAR) 100-12.5 MG per tablet Take 1 tablet by mouth daily. 11/19/13  Yes Lysbeth Penner, FNP  NITROGLYCERIN SL Place under the tongue.   Yes Historical Provider, MD  pantoprazole (PROTONIX) 40 MG tablet Take 40 mg by mouth daily.   Yes Historical Provider, MD  prasugrel (EFFIENT) 10 MG TABS Take by mouth.   Yes Historical Provider, MD  vardenafil (LEVITRA) 20 MG tablet Take 20 mg by mouth daily as needed.     Yes Historical Provider, MD  tamsulosin (FLOMAX) 0.4 MG CAPS capsule Take 1 capsule (0.4 mg total) by mouth daily. 01/01/14   Lysbeth Penner, FNP    Family History  Family History  Problem Relation Age of Onset  . Heart disease Father   . Hypertension Father   . Diabetes Brother     Social History  History   Social History  . Marital Status: Married    Spouse Name: N/A    Number of Children: N/A  . Years of Education: N/A   Occupational History  . Not on file.   Social History Main Topics  . Smoking status: Former Research scientist (life sciences)  . Smokeless tobacco: Not on file  . Alcohol  Use: Yes  . Drug Use: No  . Sexual Activity: Not on file   Other Topics Concern  . Not on file   Social History Narrative  . No narrative on file     Review of Systems, as per HPI, otherwise negative General:  No chills, fever, night sweats or weight changes.  Cardiovascular:  No chest pain, dyspnea on exertion, edema, orthopnea, palpitations, paroxysmal nocturnal dyspnea. Dermatological: No rash, lesions/masses Respiratory: No cough, dyspnea Urologic: No hematuria, dysuria Abdominal:   No nausea, vomiting, diarrhea, bright red blood per rectum, melena, or hematemesis Neurologic:  No visual changes,  wkns, changes in mental status. All other systems reviewed and are otherwise negative except as noted above.  Physical Exam  Blood pressure 128/76, pulse 84, height 6\' 1"  (1.854 m), weight 198 lb 6.4 oz (89.994 kg).  General: Pleasant, NAD Psych: Normal affect. Neuro: Alert and oriented X 3. Moves all extremities spontaneously. HEENT: Normal  Neck: Supple without bruits or JVD. Lungs:  Resp regular and unlabored, CTA. Heart: RRR no s3, s4, or murmurs. Abdomen: Soft, non-tender, non-distended, BS + x 4.  Extremities: No clubbing, cyanosis or edema. DP/PT/Radials 2+ and equal bilaterally.  Labs:  Lab Results  Component Value Date   WBC 8.0 01/27/2014   HGB 13.5 01/27/2014   HCT 40.6 01/27/2014   MCV 95.7 01/27/2014   PLT 216.0 01/27/2014   Accessory Clinical Findings  Exercise nuclear stress test - 01/22/2014 Impression  Exercise Capacity: Fair exercise capacity.  BP Response: Normal blood pressure response.  Clinical Symptoms: Leg fatigue  ECG Impression: No significant ST segment change suggestive of ischemia.  Comparison with Prior Nuclear Study: No previous nuclear study performed  Overall Impression: Intermediate risk stress nuclear study with moderate-sized (Extent 9%, SDS 5), reversible mid to distal anteroapical defect.  LV Ejection Fraction: 56%. LV Wall Motion: Normal Wall Motion  Left cardiac cath 01/29/2014 Left mainstem: Normal  Left anterior descending (LAD): The proximal to mid LAD is stented. The stents are widely patent. No other disease in the LAD  Left circumflex (LCx): There is 30% stenosis in the proximal vessel and 40% eccentric disease in the mid vessel.  Right coronary artery (RCA): 40% distal RCA. The PDA is small with 50-60% mid vessel disease.  Left ventriculography: Left ventricular systolic function is abnormal, there is focal apical akinesis. LVEF is estimated at 50%, there is no significant mitral regurgitation  Final Conclusions:  1. Nonobstructive CAD,  the LAD stents are widely patent.  Recommendations: medical management.  ECG - sinus rhythm, nonspecific T wave abnormalities  CXR 01/01/14 DG CHEST The heart size and mediastinal contours are within normal limits. Both lungs are clear. The visualized skeletal structures are unremarkable. IMPRESSION No active cardiopulmonary disease.   Assessment & Plan  58 year old gentleman with known CAD, hypertension hyperlipidemia  1. Fatigue and chest pain - abnormal nuclear stress test, but non-obstructive CAD on cath on 01/29/14. Continues having CP< we will add Ranolazine 500 mg po BID. Follow up in 6 months.   2. Hypertension - controlled.  3. Hyperlipidemia - followed by his family physician checked yesterday, we'll try to obtain results  4. Erectal dysfunction - on Vardenafil  5. COPD - currently 3 cigarettes/day, working on quitting  Follow up in 6 months.   Dorothy Spark, MD, [facility] 02/07/2014, 4:17 PM

## 2014-02-07 NOTE — Patient Instructions (Signed)
Your physician has recommended you make the following change in your medication: start taking Ranexa 500 mg twice daily  Your physician wants you to follow-up in: 6 months. You will receive a reminder letter in the mail two months in advance. If you don't receive a letter, please call our office to schedule the follow-up appointment.

## 2014-02-14 ENCOUNTER — Ambulatory Visit: Payer: 59 | Admitting: Gastroenterology

## 2014-04-09 ENCOUNTER — Emergency Department (HOSPITAL_COMMUNITY)
Admission: EM | Admit: 2014-04-09 | Discharge: 2014-04-09 | Disposition: A | Discharge status: Home / Self Care | Payer: 59 | Attending: Emergency Medicine | Admitting: Emergency Medicine

## 2014-04-09 ENCOUNTER — Telehealth: Payer: Self-pay | Admitting: Cardiology

## 2014-04-09 ENCOUNTER — Encounter (HOSPITAL_COMMUNITY): Payer: Self-pay | Admitting: Emergency Medicine

## 2014-04-09 DIAGNOSIS — Z87891 Personal history of nicotine dependence: Secondary | ICD-10-CM | POA: Insufficient documentation

## 2014-04-09 DIAGNOSIS — Z87448 Personal history of other diseases of urinary system: Secondary | ICD-10-CM | POA: Insufficient documentation

## 2014-04-09 DIAGNOSIS — I2510 Atherosclerotic heart disease of native coronary artery without angina pectoris: Secondary | ICD-10-CM | POA: Insufficient documentation

## 2014-04-09 DIAGNOSIS — Z79899 Other long term (current) drug therapy: Secondary | ICD-10-CM | POA: Insufficient documentation

## 2014-04-09 DIAGNOSIS — I10 Essential (primary) hypertension: Secondary | ICD-10-CM | POA: Insufficient documentation

## 2014-04-09 DIAGNOSIS — Z7982 Long term (current) use of aspirin: Secondary | ICD-10-CM | POA: Insufficient documentation

## 2014-04-09 DIAGNOSIS — I252 Old myocardial infarction: Secondary | ICD-10-CM | POA: Insufficient documentation

## 2014-04-09 DIAGNOSIS — R079 Chest pain, unspecified: Secondary | ICD-10-CM | POA: Insufficient documentation

## 2014-04-09 DIAGNOSIS — R55 Syncope and collapse: Secondary | ICD-10-CM

## 2014-04-09 DIAGNOSIS — K219 Gastro-esophageal reflux disease without esophagitis: Secondary | ICD-10-CM | POA: Insufficient documentation

## 2014-04-09 DIAGNOSIS — E7800 Pure hypercholesterolemia, unspecified: Secondary | ICD-10-CM | POA: Insufficient documentation

## 2014-04-09 LAB — CBC WITH DIFFERENTIAL/PLATELET
Basophils Absolute: 0 10*3/uL (ref 0.0–0.1)
Basophils Relative: 0 % (ref 0–1)
Eosinophils Absolute: 0.3 10*3/uL (ref 0.0–0.7)
Eosinophils Relative: 3 % (ref 0–5)
HCT: 39.1 % (ref 39.0–52.0)
Hemoglobin: 13.6 g/dL (ref 13.0–17.0)
Lymphocytes Relative: 24 % (ref 12–46)
Lymphs Abs: 2.1 10*3/uL (ref 0.7–4.0)
MCH: 32 pg (ref 26.0–34.0)
MCHC: 34.8 g/dL (ref 30.0–36.0)
MCV: 92 fL (ref 78.0–100.0)
Monocytes Absolute: 0.5 10*3/uL (ref 0.1–1.0)
Monocytes Relative: 6 % (ref 3–12)
Neutro Abs: 5.6 10*3/uL (ref 1.7–7.7)
Neutrophils Relative %: 67 % (ref 43–77)
Platelets: 224 10*3/uL (ref 150–400)
RBC: 4.25 MIL/uL (ref 4.22–5.81)
RDW: 13.6 % (ref 11.5–15.5)
WBC: 8.5 10*3/uL (ref 4.0–10.5)

## 2014-04-09 LAB — URINALYSIS, ROUTINE W REFLEX MICROSCOPIC
BILIRUBIN URINE: NEGATIVE
GLUCOSE, UA: NEGATIVE mg/dL
Hgb urine dipstick: NEGATIVE
KETONES UR: NEGATIVE mg/dL
LEUKOCYTES UA: NEGATIVE
Nitrite: NEGATIVE
PH: 5.5 (ref 5.0–8.0)
Protein, ur: NEGATIVE mg/dL
SPECIFIC GRAVITY, URINE: 1.009 (ref 1.005–1.030)
Urobilinogen, UA: 0.2 mg/dL (ref 0.0–1.0)

## 2014-04-09 LAB — BASIC METABOLIC PANEL
BUN: 12 mg/dL (ref 6–23)
CO2: 26 mEq/L (ref 19–32)
Calcium: 10.1 mg/dL (ref 8.4–10.5)
Chloride: 98 mEq/L (ref 96–112)
Creatinine, Ser: 1.23 mg/dL (ref 0.50–1.35)
GFR calc Af Amer: 73 mL/min — ABNORMAL LOW (ref >90)
GFR calc non Af Amer: 63 mL/min — ABNORMAL LOW (ref >90)
Glucose, Bld: 106 mg/dL — ABNORMAL HIGH (ref 70–99)
Potassium: 3.8 mEq/L (ref 3.7–5.3)
Sodium: 139 mEq/L (ref 137–147)

## 2014-04-09 LAB — TROPONIN I: Troponin I: <0.3 ng/mL (ref <0.30)

## 2014-04-09 NOTE — Telephone Encounter (Signed)
Please call him and ask him how is he doing. I would advice to stay hydrated and increase oral intake. If the symptoms persist he should call us back. Thank you, K

## 2014-04-09 NOTE — Telephone Encounter (Signed)
New message      Pt passed out this am.  Called the EMS--everything check out OK---He did not go to the hospital---job will not let him work until he calls the doctor.  Please call

## 2014-04-09 NOTE — Discharge Instructions (Signed)
Syncope °Syncope is a medical term for fainting or passing out. This means you lose consciousness and drop to the ground. People are generally unconscious for less than 5 minutes. You may have some muscle twitches for up to 15 seconds before waking up and returning to normal. Syncope occurs more often in older adults, but it can happen to anyone. While most causes of syncope are not dangerous, syncope can be a sign of a serious medical problem. It is important to seek medical care.  °CAUSES  °Syncope is caused by a sudden drop in blood flow to the brain. The specific cause is often not determined. Factors that can bring on syncope include: °· Taking medicines that lower blood pressure. °· Sudden changes in posture, such as standing up quickly. °· Taking more medicine than prescribed. °· Standing in one place for too long. °· Seizure disorders. °· Dehydration and excessive exposure to heat. °· Low blood sugar (hypoglycemia). °· Straining to have a bowel movement. °· Heart disease, irregular heartbeat, or other circulatory problems. °· Fear, emotional distress, seeing blood, or severe pain. °SYMPTOMS  °Right before fainting, you may: °· Feel dizzy or light-headed. °· Feel nauseous. °· See all white or all black in your field of vision. °· Have cold, clammy skin. °DIAGNOSIS  °Your health care provider will ask about your symptoms, perform a physical exam, and perform an electrocardiogram (ECG) to record the electrical activity of your heart. Your health care provider may also perform other heart or blood tests to determine the cause of your syncope which may include: °· Transthoracic echocardiogram (TTE). During echocardiography, sound waves are used to evaluate how blood flows through your heart. °· Transesophageal echocardiogram (TEE). °· Cardiac monitoring. This allows your health care provider to monitor your heart rate and rhythm in real time. °· Holter monitor. This is a portable device that records your  heartbeat and can help diagnose heart arrhythmias. It allows your health care provider to track your heart activity for several days, if needed. °· Stress tests by exercise or by giving medicine that makes the heart beat faster. °TREATMENT  °In most cases, no treatment is needed. Depending on the cause of your syncope, your health care provider may recommend changing or stopping some of your medicines. °HOME CARE INSTRUCTIONS °· Have someone stay with you until you feel stable. °· Do not drive, use machinery, or play sports until your health care provider says it is okay. °· Keep all follow-up appointments as directed by your health care provider. °· Lie down right away if you start feeling like you might faint. Breathe deeply and steadily. Wait until all the symptoms have passed. °· Drink enough fluids to keep your urine clear or pale yellow. °· If you are taking blood pressure or heart medicine, get up slowly and take several minutes to sit and then stand. This can reduce dizziness. °SEEK IMMEDIATE MEDICAL CARE IF:  °· You have a severe headache. °· You have unusual pain in the chest, abdomen, or back. °· You are bleeding from your mouth or rectum, or you have black or tarry stool. °· You have an irregular or very fast heartbeat. °· You have pain with breathing. °· You have repeated fainting or seizure-like jerking during an episode. °· You faint when sitting or lying down. °· You have confusion. °· You have trouble walking. °· You have severe weakness. °· You have vision problems. °If you fainted, call your local emergency services (911 in [place] Do not drive   yourself to the hospital.  °MAKE SURE YOU: °· Understand these instructions. °· Will watch your condition. °· Will get help right away if you are not doing well or get worse. °Document Released: 10/10/2005 Document Revised: 10/15/2013 Document Reviewed: 12/09/2011 °[employer]® Patient Information ©2015 [employer]. This information is not intended to replace  advice given to you by your health care provider. Make sure you discuss any questions you have with your health care provider. ° °

## 2014-04-09 NOTE — ED Notes (Signed)
Pt states he passed out at work this morning. States he became dizzy and weak, then woke up on the floor surrounded by co-workers. States he passed out when he had MI several years ago, denies chest pain upon arrival. Respirations unlabored. Skin warm and dry. He is alert and oriented x4.

## 2014-04-09 NOTE — Telephone Encounter (Signed)
Called pt to check on him and he stated he is feeling much better.  Pt did go to ER today and diagnosed syncopal episodes.  Went over discharge instructions with pt and told him per Dr Meda Coffee he needs to stay plenty hydrated and increase his oral intake.  Pt verbalized understanding and pleased with the check-up.

## 2014-04-09 NOTE — Telephone Encounter (Signed)
Pt states that when he was at work this morning he started walking down the hallway to go to a meeting and started feeling very dizzy and having tunnel vision, then he passed out.  Pt states he woke up to coworkers surrounding him.  EMS was called and responded to pt.  According to the pt EMS said his BP -158/80 HR-unknown.  Asked pt if EMS did a 12 lead EKG and he stated yes and they said he wasn't actively having a heart attack, but advised him to go to the ER for syncopal episodes.  Pt states his BS was 157.  Pt states he did not hit his head, but does have a knot on is elbow.  Pt thinks he passed out because of not eating and getting too hot. Pt does not work outdoors.  Pt did refuse to go to the ER until Dr Meda Coffee advised him too. Informed pt that Dr Meda Coffee is out of the office today.  Given pts history, and the Paramedics advise, I told this pt to go to [facility] ER for further work-up from syncopal episodes.  Told pt that I will notify our card master Trish, to make her aware of pt coming to the ER.  Advised pt to go EMS if he doesn't have transportation to get there.  Pt verbalized understanding and is calling EMS now to be transported to [facility] ER.  I will send this message to Dr Meda Coffee for further review.

## 2014-04-09 NOTE — ED Notes (Signed)
Pt presents to department via [facility] for evaluation of syncope. Pt reports x1 episode of syncope at work this morning, episode lasted approximately 1 minute. He is conscious alert and oriented x4. 18g LAC. CBG 157. Denies pain upon arrival to ED. History of MI x2 years ago with stent placement.

## 2014-04-11 NOTE — ED Provider Notes (Signed)
CSN: [id number]     Arrival date & time 04/09/14  1043 History   First MD Initiated Contact with Patient 04/09/14 1056     Chief Complaint  Patient presents with  . Loss of Consciousness     (Consider location/radiation/quality/duration/timing/severity/associated sxs/prior Treatment) HPI  58 year old male with syncope. Happened at work shortly before arrival. 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 He apparently did have a brief loss of consciousness. Quick return to baseline. Has increasing chest pain. Currently no complaints. No history of recurrent syncope. Did not eat breakfast this morning. No fever or chills. No n/v. Reports compliance with his medications.   Past Medical History  Diagnosis Date  . Coronary artery disease   . MI (myocardial infarction)   . Hypercholesteremia   . Reflux   . Hypertension   . Syncope   . Genitourinary disorder   . ED (erectile dysfunction)   . Vitamin D deficiency    Past Surgical History  Procedure Laterality Date  . Total hip arthroplasty    . Appendectomy    . Hernia repair     Family History  Problem Relation Age of Onset  . Heart disease Father   . Hypertension Father   . Diabetes Brother    History  Substance Use Topics  . Smoking status: Former Research scientist (life sciences)  . Smokeless tobacco: Not on file  . Alcohol Use: Yes    Review of Systems  All systems reviewed and negative, other than as noted in HPI.   Allergies  Review of patient's allergies indicates no known allergies.  Home Medications   Prior to Admission medications   Medication Sig Start Date End Date Taking? Authorizing Provider  aspirin 81 MG EC tablet Take 81 mg by mouth daily.     Yes Historical Provider, MD  atorvastatin (LIPITOR) 80 MG tablet Take 80 mg by mouth daily.   Yes Historical Provider, MD  losartan-hydrochlorothiazide (HYZAAR) 100-12.5 MG per tablet Take 1 tablet by mouth  daily. 11/19/13  Yes Lysbeth Penner, FNP  nitroGLYCERIN (NITROSTAT) 0.4 MG SL tablet Place 0.4 mg under the tongue every 5 (five) minutes as needed for chest pain.   Yes Historical Provider, MD  pantoprazole (PROTONIX) 40 MG tablet Take 40 mg by mouth daily.   Yes Historical Provider, MD  prasugrel (EFFIENT) 10 MG TABS Take 10 mg by mouth daily.    Yes Historical Provider, MD  ranolazine (RANEXA) 500 MG 12 hr tablet Take 1 tablet (500 mg total) by mouth 2 (two) times daily. 02/07/14  Yes Dorothy Spark, MD  silodosin (RAPAFLO) 8 MG CAPS capsule Take 8 mg by mouth daily with breakfast.   Yes Historical Provider, MD   BP 126/76  Pulse 69  Temp(Src) 97.5 F (36.4 C) (Oral)  Resp 15  SpO2 100% Physical Exam  Nursing note and vitals reviewed. Constitutional: He appears well-developed and well-nourished. No distress.  HENT:  Head: Normocephalic and atraumatic.  Eyes: Conjunctivae are normal. Right eye exhibits no discharge. Left eye exhibits no discharge.  Neck: Neck supple.  Cardiovascular: Normal rate, regular rhythm and normal heart sounds.  Exam reveals no gallop and no friction rub.   No murmur heard. Pulmonary/Chest: Effort normal and breath sounds normal. No respiratory distress.  Abdominal: Soft. He exhibits no distension. There is no tenderness.  Musculoskeletal: He exhibits no edema and no tenderness.  Neurological: He is alert.  Skin: Skin is warm and dry.  Psychiatric: He has a normal mood and affect. His behavior is normal. Thought content normal.    ED Course  Procedures (including critical care time) Labs Review Labs Reviewed  BASIC METABOLIC PANEL - Abnormal; Notable for the following:    Glucose, Bld 106 (*)    GFR calc non Af Amer 63 (*)    GFR calc Af Amer 73 (*)    All other components within normal limits  URINALYSIS, ROUTINE W REFLEX MICROSCOPIC  CBC WITH DIFFERENTIAL  TROPONIN I    Imaging Review No results found.   EKG Interpretation   Date/Time:   Wednesday April 09 2014 10:48:13 EDT Ventricular Rate:  76 PR Interval:  179 QRS Duration: 79 QT Interval:  408 QTC Calculation: 459 R Axis:   23 Text Interpretation:  Sinus rhythm Borderline T abnormalities, inferior  leads Confirmed by Wilson Singer  MD, STEPHEN ([id number]) on 04/09/2014 11:27:14 AM      MDM   Final diagnoses:  Syncope    57yM with syncope. No further complaints. Normotensive. Afebrile. Normal H/H. Low suspicion for emergent process. Return precautions discussed.     Virgel Manifold, MD 04/11/14 [phone]

## 2014-10-02 ENCOUNTER — Encounter (HOSPITAL_COMMUNITY): Payer: Self-pay | Admitting: Cardiology

## 2014-10-21 ENCOUNTER — Ambulatory Visit (INDEPENDENT_AMBULATORY_CARE_PROVIDER_SITE_OTHER): Payer: 59 | Admitting: Family Medicine

## 2014-10-21 ENCOUNTER — Ambulatory Visit (HOSPITAL_COMMUNITY)
Admission: RE | Admit: 2014-10-21 | Discharge: 2014-10-21 | Disposition: A | Discharge status: Home / Self Care | Payer: 59 | Source: Ambulatory Visit | Attending: Family Medicine | Admitting: Family Medicine

## 2014-10-21 ENCOUNTER — Other Ambulatory Visit: Payer: Self-pay

## 2014-10-21 ENCOUNTER — Encounter: Payer: Self-pay | Admitting: Family Medicine

## 2014-10-21 ENCOUNTER — Other Ambulatory Visit: Payer: Self-pay | Admitting: Family Medicine

## 2014-10-21 ENCOUNTER — Encounter (HOSPITAL_COMMUNITY): Payer: Self-pay

## 2014-10-21 VITALS — BP 148/99 | HR 92 | Temp 98.3°F | Ht 73.0 in | Wt 190.0 lb

## 2014-10-21 DIAGNOSIS — R51 Headache: Principal | ICD-10-CM

## 2014-10-21 DIAGNOSIS — R519 Headache, unspecified: Secondary | ICD-10-CM

## 2014-10-21 DIAGNOSIS — Z7901 Long term (current) use of anticoagulants: Secondary | ICD-10-CM | POA: Diagnosis not present

## 2014-10-21 DIAGNOSIS — G44021 Chronic cluster headache, intractable: Secondary | ICD-10-CM

## 2014-10-21 MED ORDER — HYDROCODONE-ACETAMINOPHEN 5-325 MG PO TABS: 1.0000 | ORAL_TABLET | Freq: Four times a day (QID) | ORAL | Status: DC | PRN

## 2014-10-21 MED ORDER — METHYLPREDNISOLONE (PAK) 4 MG PO TABS: ORAL_TABLET | ORAL | Status: DC

## 2014-10-21 NOTE — Addendum Note (Signed)
Addended by: Lysbeth Penner on: 10/21/2014 03:42 PM   Modules accepted: Orders

## 2014-10-21 NOTE — Progress Notes (Signed)
   Subjective:    Patient ID: Paul Murillo, male    DOB: 02-26-1956, 59 y.o.   MRN: [id number]  HPI Patient is here for headache that is new and is the worse headache of his life.  He states he has been having some dizziness and feels weak.  Review of Systems  Constitutional: Negative for fever.  HENT: Negative for ear pain.   Eyes: Negative for discharge.  Respiratory: Negative for cough.   Cardiovascular: Negative for chest pain.  Gastrointestinal: Negative for abdominal distention.  Endocrine: Negative for polyuria.  Genitourinary: Negative for difficulty urinating.  Musculoskeletal: Negative for gait problem and neck pain.  Skin: Negative for color change and rash.  Neurological: Negative for speech difficulty and headaches.  Psychiatric/Behavioral: Negative for agitation.       Objective:    BP 148/99 mmHg  Pulse 92  Temp(Src) 98.3 F (36.8 C) (Oral)  Ht 6\' 1"  (1.854 m)  Wt 190 lb (86.183 kg)  BMI 25.07 kg/m2 Physical Exam  Constitutional: He is oriented to person, place, and time. He appears well-developed and well-nourished.  HENT:  Head: Normocephalic and atraumatic.  Mouth/Throat: Oropharynx is clear and moist.  Eyes: Pupils are equal, round, and reactive to light.  Neck: Normal range of motion. Neck supple.  Cardiovascular: Normal rate and regular rhythm.   No murmur heard. Pulmonary/Chest: Effort normal and breath sounds normal.  Abdominal: Soft. Bowel sounds are normal. There is no tenderness.  Neurological: He is alert and oriented to person, place, and time.  Skin: Skin is warm and dry.  Psychiatric: He has a normal mood and affect.          Assessment & Plan:     ICD-9-CM ICD-10-CM   1. Intractable chronic cluster headache 339.02 G44.021 CT Head W Contrast     methylPREDNIsolone (MEDROL DOSPACK) 4 MG tablet     HYDROcodone-acetaminophen (NORCO) 5-325 MG per tablet     No Follow-up on file.  Lysbeth Penner FNP

## 2014-10-21 NOTE — Addendum Note (Signed)
Addended by: Marin Olp on: 10/21/2014 05:49 PM   Modules accepted: Orders

## 2014-11-03 ENCOUNTER — Ambulatory Visit (INDEPENDENT_AMBULATORY_CARE_PROVIDER_SITE_OTHER): Payer: 59 | Admitting: Family Medicine

## 2014-11-03 ENCOUNTER — Encounter: Payer: Self-pay | Admitting: Family Medicine

## 2014-11-03 VITALS — BP 131/82 | HR 76 | Temp 98.4°F | Ht 73.0 in | Wt 184.2 lb

## 2014-11-03 DIAGNOSIS — G44021 Chronic cluster headache, intractable: Secondary | ICD-10-CM

## 2014-11-03 DIAGNOSIS — J0100 Acute maxillary sinusitis, unspecified: Secondary | ICD-10-CM

## 2014-11-03 MED ORDER — METHYLPREDNISOLONE ACETATE 80 MG/ML IJ SUSP
80.0000 mg | Freq: Once | INTRAMUSCULAR | Status: AC
  Administered 2014-11-03: 80 mg via INTRAMUSCULAR

## 2014-11-03 MED ORDER — PREDNISONE 10 MG PO TABS: ORAL_TABLET | ORAL | Status: DC

## 2014-11-03 MED ORDER — HYDROCODONE-ACETAMINOPHEN 5-325 MG PO TABS: 1.0000 | ORAL_TABLET | Freq: Four times a day (QID) | ORAL | Status: DC | PRN

## 2014-11-03 MED ORDER — LEVOFLOXACIN 500 MG PO TABS: 500.0000 mg | ORAL_TABLET | Freq: Every day | ORAL | Status: DC

## 2014-11-03 NOTE — Progress Notes (Signed)
   Subjective:    Patient ID: Paul Murillo, male    DOB: [birth year], 59 y.o.   MRN: [id number]  HPI Patient is here c/o Headache and URI sx's.  Review of Systems  Constitutional: Negative for fever.  HENT: Negative for ear pain.   Eyes: Negative for discharge.  Respiratory: Negative for cough.   Cardiovascular: Negative for chest pain.  Gastrointestinal: Negative for abdominal distention.  Endocrine: Negative for polyuria.  Genitourinary: Negative for difficulty urinating.  Musculoskeletal: Negative for gait problem and neck pain.  Skin: Negative for color change and rash.  Neurological: Negative for speech difficulty and headaches.  Psychiatric/Behavioral: Negative for agitation.       Objective:    BP 131/82 mmHg  Pulse 76  Temp(Src) 98.4 F (36.9 C) (Oral)  Ht 6\' 1"  (1.854 m)  Wt 184 lb 3.2 oz (83.553 kg)  BMI 24.31 kg/m2   Physical Exam  Constitutional: He is oriented to person, place, and time. He appears well-developed and well-nourished.  HENT:  Head: Normocephalic and atraumatic.  Mouth/Throat: Oropharynx is clear and moist.  Eyes: Pupils are equal, round, and reactive to light.  Neck: Normal range of motion. Neck supple.  Cardiovascular: Normal rate and regular rhythm.   No murmur heard. Pulmonary/Chest: Effort normal and breath sounds normal.  Abdominal: Soft. Bowel sounds are normal. There is no tenderness.  Neurological: He is alert and oriented to person, place, and time.  Skin: Skin is warm and dry.  Psychiatric: He has a normal mood and affect.          Assessment & Plan:     ICD-9-CM ICD-10-CM   1. Intractable chronic cluster headache 339.02 G44.021 predniSONE (DELTASONE) 10 MG tablet     HYDROcodone-acetaminophen (NORCO) 5-325 MG per tablet     levofloxacin (LEVAQUIN) 500 MG tablet     methylPREDNISolone acetate (DEPO-MEDROL) injection 80 mg  2. Subacute maxillary sinusitis 461.0 J01.00 predniSONE (DELTASONE) 10 MG tablet   HYDROcodone-acetaminophen (NORCO) 5-325 MG per tablet     levofloxacin (LEVAQUIN) 500 MG tablet     methylPREDNISolone acetate (DEPO-MEDROL) injection 80 mg     No Follow-up on file.  Lysbeth Penner FNP

## 2014-11-14 ENCOUNTER — Other Ambulatory Visit: Payer: Self-pay | Admitting: Family Medicine

## 2015-07-30 ENCOUNTER — Ambulatory Visit (INDEPENDENT_AMBULATORY_CARE_PROVIDER_SITE_OTHER): Payer: Commercial Managed Care - HMO | Admitting: *Deleted

## 2015-07-30 DIAGNOSIS — Z23 Encounter for immunization: Secondary | ICD-10-CM | POA: Diagnosis not present

## 2015-08-11 ENCOUNTER — Ambulatory Visit (INDEPENDENT_AMBULATORY_CARE_PROVIDER_SITE_OTHER): Payer: Commercial Managed Care - HMO | Admitting: Family

## 2015-08-11 ENCOUNTER — Encounter: Payer: Self-pay | Admitting: Family

## 2015-08-11 VITALS — BP 161/106 | HR 82 | Temp 97.3°F | Ht 73.0 in | Wt 188.8 lb

## 2015-08-11 DIAGNOSIS — R197 Diarrhea, unspecified: Secondary | ICD-10-CM | POA: Diagnosis not present

## 2015-08-11 DIAGNOSIS — R112 Nausea with vomiting, unspecified: Secondary | ICD-10-CM | POA: Diagnosis not present

## 2015-08-11 DIAGNOSIS — A084 Viral intestinal infection, unspecified: Secondary | ICD-10-CM | POA: Diagnosis not present

## 2015-08-11 DIAGNOSIS — I10 Essential (primary) hypertension: Secondary | ICD-10-CM | POA: Diagnosis not present

## 2015-08-11 LAB — POCT INFLUENZA A/B
Influenza A, POC: NEGATIVE
Influenza B, POC: NEGATIVE

## 2015-08-11 MED ORDER — LOSARTAN POTASSIUM-HCTZ 100-12.5 MG PO TABS: 1.0000 | ORAL_TABLET | Freq: Every day | ORAL | Status: DC

## 2015-08-11 NOTE — Progress Notes (Signed)
Subjective:    Patient ID: Paul Murillo, male    DOB: 1956/05/15, 59 y.o.   MRN: [id number]  Pt presents to the office today for N&V, diarrhea, and myalgia. Pt states he has not taken his blood pressure medication in "awhile" because he ran out. Pt's BP is elevated today.  Diarrhea  This is a new problem. The current episode started yesterday. The problem occurs 2 to 4 times per day. The problem has been unchanged. The stool consistency is described as watery. Associated symptoms include chills, myalgias and vomiting. Pertinent negatives include no abdominal pain, bloating, fever or headaches. He has tried increased fluids for the symptoms. The treatment provided no relief.  Emesis  This is a new problem. The current episode started yesterday. The problem occurs 2 to 4 times per day. The problem has been unchanged. The emesis has an appearance of stomach contents. Associated symptoms include chills, diarrhea and myalgias. Pertinent negatives include no abdominal pain, fever or headaches. He has tried acetaminophen for the symptoms. The treatment provided mild relief.  Hypertension This is a chronic problem. The current episode started more than 1 year ago. The problem has been waxing and waning since onset. The problem is uncontrolled. Pertinent negatives include no anxiety, headaches, palpitations, peripheral edema or shortness of breath. Risk factors for coronary artery disease include sedentary lifestyle, male gender, dyslipidemia and family history. Past treatments include nothing (Pt has been out of his medication ). The current treatment provides no improvement. Hypertensive end-organ damage includes CAD/MI. There is no history of CVA, heart failure or a thyroid problem. There is no history of sleep apnea.      Review of Systems  Constitutional: Positive for chills. Negative for fever.  HENT: Negative.   Respiratory: Negative.  Negative for shortness of breath.   Cardiovascular: Negative.   Negative for palpitations.  Gastrointestinal: Positive for vomiting and diarrhea. Negative for abdominal pain and bloating.  Endocrine: Negative.   Genitourinary: Negative.   Musculoskeletal: Positive for myalgias.  Neurological: Negative.  Negative for headaches.  Hematological: Negative.   Psychiatric/Behavioral: Negative.   All other systems reviewed and are negative.      Objective:   Physical Exam  Constitutional: He is oriented to person, place, and time. He appears well-developed and well-nourished. No distress.  HENT:  Head: Normocephalic.  Right Ear: External ear normal.  Left Ear: External ear normal.  Mouth/Throat: Oropharynx is clear and moist.  Eyes: Pupils are equal, round, and reactive to light. Right eye exhibits no discharge. Left eye exhibits no discharge.  Neck: Normal range of motion. Neck supple. No thyromegaly present.  Cardiovascular: Normal rate, regular rhythm, normal heart sounds and intact distal pulses.   No murmur heard. Pulmonary/Chest: Effort normal and breath sounds normal. No respiratory distress. He has no wheezes.  Abdominal: Soft. Bowel sounds are normal. He exhibits no distension. There is no tenderness.  Musculoskeletal: Normal range of motion. He exhibits no edema or tenderness.  Neurological: He is alert and oriented to person, place, and time. He has normal reflexes. No cranial nerve deficit.  Skin: Skin is warm and dry. No rash noted. No erythema.  Psychiatric: He has a normal mood and affect. His behavior is normal. Judgment and thought content normal.  Vitals reviewed.     BP 161/106 mmHg  Pulse 82  Temp(Src) 97.3 F (36.3 C) (Oral)  Ht 6' 1" (1.854 m)  Wt 188 lb 12.8 oz (85.639 kg)  BMI 24.91 kg/m2  Assessment & Plan:  1. Essential hypertension ---Pt's Hyzaar reordered today -Dash diet information given -Exercise encouraged - Stress Management  -Continue current meds -RTO in 2 weeks  - losartan-hydrochlorothiazide  (HYZAAR) 100-12.5 MG tablet; Take 1 tablet by mouth daily.  Dispense: 90 tablet; Refill: 1 - CMP14+EGFR  2. Nausea and vomiting, intractability of vomiting not specified, unspecified vomiting type - POCT Influenza A/B - CMP14+EGFR  3. Diarrhea, unspecified type - POCT Influenza A/B - CMP14+EGFR  4. Viral gastroenteritis -Bland diet -Force fluids Rest Tylenol prn for pain and fever - CMP14+EGFR  Christy Hawks, FNP  

## 2015-08-11 NOTE — Patient Instructions (Signed)

## 2015-08-12 LAB — CMP14+EGFR
ALBUMIN: 4.4 g/dL (ref 3.6–4.8)
ALT: 25 IU/L (ref 0–44)
AST: 32 IU/L (ref 0–40)
Albumin/Globulin Ratio: 1.8 (ref 1.1–2.5)
Alkaline Phosphatase: 70 IU/L (ref 39–117)
BILIRUBIN TOTAL: 0.5 mg/dL (ref 0.0–1.2)
BUN / CREAT RATIO: 6 — AB (ref 10–22)
BUN: 6 mg/dL — ABNORMAL LOW (ref 8–27)
CHLORIDE: 99 mmol/L (ref 97–106)
CO2: 21 mmol/L (ref 18–29)
Calcium: 9.3 mg/dL (ref 8.6–10.2)
Creatinine, Ser: 1.01 mg/dL (ref 0.76–1.27)
GFR calc non Af Amer: 80 mL/min/{1.73_m2} (ref >59)
GFR, EST AFRICAN AMERICAN: 92 mL/min/{1.73_m2} (ref >59)
Globulin, Total: 2.4 g/dL (ref 1.5–4.5)
Glucose: 93 mg/dL (ref 65–99)
POTASSIUM: 3.9 mmol/L (ref 3.5–5.2)
SODIUM: 138 mmol/L (ref 136–144)
Total Protein: 6.8 g/dL (ref 6.0–8.5)

## 2015-08-25 ENCOUNTER — Encounter: Payer: Self-pay | Admitting: Family

## 2015-08-25 ENCOUNTER — Ambulatory Visit (INDEPENDENT_AMBULATORY_CARE_PROVIDER_SITE_OTHER): Payer: Commercial Managed Care - HMO | Admitting: Family

## 2015-08-25 VITALS — BP 129/88 | HR 96 | Temp 97.6°F | Ht 73.0 in | Wt 186.0 lb

## 2015-08-25 DIAGNOSIS — K219 Gastro-esophageal reflux disease without esophagitis: Secondary | ICD-10-CM | POA: Diagnosis not present

## 2015-08-25 DIAGNOSIS — I2510 Atherosclerotic heart disease of native coronary artery without angina pectoris: Secondary | ICD-10-CM | POA: Diagnosis not present

## 2015-08-25 DIAGNOSIS — Z1159 Encounter for screening for other viral diseases: Secondary | ICD-10-CM | POA: Diagnosis not present

## 2015-08-25 DIAGNOSIS — Z720 Tobacco use: Secondary | ICD-10-CM | POA: Diagnosis not present

## 2015-08-25 DIAGNOSIS — E785 Hyperlipidemia, unspecified: Secondary | ICD-10-CM | POA: Diagnosis not present

## 2015-08-25 DIAGNOSIS — I10 Essential (primary) hypertension: Secondary | ICD-10-CM | POA: Diagnosis not present

## 2015-08-25 DIAGNOSIS — Z125 Encounter for screening for malignant neoplasm of prostate: Secondary | ICD-10-CM | POA: Diagnosis not present

## 2015-08-25 DIAGNOSIS — F17200 Nicotine dependence, unspecified, uncomplicated: Secondary | ICD-10-CM

## 2015-08-25 DIAGNOSIS — E559 Vitamin D deficiency, unspecified: Secondary | ICD-10-CM | POA: Diagnosis not present

## 2015-08-25 DIAGNOSIS — Z9861 Coronary angioplasty status: Secondary | ICD-10-CM | POA: Diagnosis not present

## 2015-08-25 MED ORDER — ATORVASTATIN CALCIUM 80 MG PO TABS: 80.0000 mg | ORAL_TABLET | Freq: Every day | ORAL | Status: DC

## 2015-08-25 MED ORDER — PANTOPRAZOLE SODIUM 40 MG PO TBEC: 40.0000 mg | DELAYED_RELEASE_TABLET | Freq: Every day | ORAL | Status: DC

## 2015-08-25 NOTE — Progress Notes (Signed)
 Subjective:    Patient ID: Paul Murillo, male    DOB: 08/05/1956, 59 y.o.   MRN: [id number]  Pt presents to the office today to recheck HTN and chronic follow up. Pt 's BP is at goal today!1 Hypertension This is a chronic problem. The current episode started more than 1 year ago. The problem has been resolved since onset. The problem is controlled. Pertinent negatives include no anxiety, blurred vision, headaches, palpitations, peripheral edema or shortness of breath. Risk factors for coronary artery disease include dyslipidemia, male gender, sedentary lifestyle, smoking/tobacco exposure, stress and family history. Past treatments include alpha 1 blockers and diuretics. The current treatment provides significant improvement. Hypertensive end-organ damage includes CAD/MI. There is no history of kidney disease, CVA, heart failure or a thyroid problem. There is no history of sleep apnea.  Hyperlipidemia This is a chronic problem. The current episode started more than 1 year ago. The problem is uncontrolled. Recent lipid tests were reviewed and are high. He has no history of diabetes. Factors aggravating his hyperlipidemia include smoking. Pertinent negatives include no shortness of breath. Current antihyperlipidemic treatment includes statins (Pt states he has been out of his medications for a few months). Compliance problems include adherence to exercise and medication side effects.  Risk factors for coronary artery disease include dyslipidemia, family history, hypertension, male sex, post-menopausal and stress.  Gastroesophageal Reflux He reports no belching, no coughing or no heartburn. This is a chronic problem. The current episode started more than 1 year ago. The problem occurs rarely. The problem has been resolved. The symptoms are aggravated by certain foods. He has tried a PPI for the symptoms. The treatment provided significant relief.      Review of Systems  Constitutional: Negative.     HENT: Negative.   Eyes: Negative for blurred vision.  Respiratory: Negative.  Negative for cough and shortness of breath.   Cardiovascular: Negative.  Negative for palpitations.  Gastrointestinal: Negative.  Negative for heartburn.  Endocrine: Negative.   Genitourinary: Negative.   Musculoskeletal: Negative.   Neurological: Negative.  Negative for headaches.  Hematological: Negative.   Psychiatric/Behavioral: Negative.   All other systems reviewed and are negative.      Objective:   Physical Exam  Constitutional: He is oriented to person, place, and time. He appears well-developed and well-nourished. No distress.  HENT:  Head: Normocephalic.  Right Ear: External ear normal.  Left Ear: External ear normal.  Nose: Nose normal.  Mouth/Throat: Oropharynx is clear and moist.  Eyes: Pupils are equal, round, and reactive to light. Right eye exhibits no discharge. Left eye exhibits no discharge.  Neck: Normal range of motion. Neck supple. No thyromegaly present.  Cardiovascular: Normal rate, regular rhythm, normal heart sounds and intact distal pulses.   No murmur heard. Pulmonary/Chest: Effort normal and breath sounds normal. No respiratory distress. He has no wheezes.  Abdominal: Soft. Bowel sounds are normal. He exhibits no distension. There is no tenderness.  Musculoskeletal: Normal range of motion. He exhibits no edema or tenderness.  Neurological: He is alert and oriented to person, place, and time. He has normal reflexes. No cranial nerve deficit.  Skin: Skin is warm and dry. No rash noted. No erythema.  Psychiatric: He has a normal mood and affect. His behavior is normal. Judgment and thought content normal.  Vitals reviewed.    BP 129/88 mmHg  Pulse 96  Temp(Src) 97.6 F (36.4 C) (Oral)  Ht 6' 1" (1.854 m)  Wt 186 lb (84.369 kg)    BMI 24.55 kg/m2      Assessment & Plan:  1. Essential hypertension - CMP14+EGFR  2. CAD S/P percutaneous coronary angioplasty -  CMP14+EGFR  3. Hyperlipemia -I reordered pt's Lipitor 80 mg today- Pt states he has not taken in several months because he was "out" - CMP14+EGFR - Lipid panel - atorvastatin (LIPITOR) 80 MG tablet; Take 1 tablet (80 mg total) by mouth daily.  Dispense: 90 tablet; Refill: 1  4. Vitamin D deficiency - CMP14+EGFR  5. Smoker - CMP14+EGFR  6. Prostate cancer screening - CMP14+EGFR - PSA, total and free  7. Gastroesophageal reflux disease, esophagitis presence not specified - CMP14+EGFR - pantoprazole (PROTONIX) 40 MG tablet; Take 1 tablet (40 mg total) by mouth daily.  Dispense: 90 tablet; Refill: 2  8. Need for hepatitis C screening test - CMP14+EGFR - Hepatitis C antibody   Continue all meds Labs pending Health Maintenance reviewed Diet and exercise encouraged RTO 6 months  [name], FNP

## 2015-08-25 NOTE — Patient Instructions (Signed)

## 2015-08-26 LAB — CMP14+EGFR
ALK PHOS: 75 IU/L (ref 39–117)
ALT: 20 IU/L (ref 0–44)
AST: 23 IU/L (ref 0–40)
Albumin/Globulin Ratio: 1.5 (ref 1.1–2.5)
Albumin: 4.8 g/dL (ref 3.6–4.8)
BILIRUBIN TOTAL: 0.7 mg/dL (ref 0.0–1.2)
BUN / CREAT RATIO: 9 — AB (ref 10–22)
BUN: 11 mg/dL (ref 8–27)
CHLORIDE: 93 mmol/L — AB (ref 97–106)
CO2: 23 mmol/L (ref 18–29)
Calcium: 10.6 mg/dL — ABNORMAL HIGH (ref 8.6–10.2)
Creatinine, Ser: 1.16 mg/dL (ref 0.76–1.27)
GFR calc Af Amer: 78 mL/min/{1.73_m2} (ref >59)
GFR calc non Af Amer: 68 mL/min/{1.73_m2} (ref >59)
GLUCOSE: 115 mg/dL — AB (ref 65–99)
Globulin, Total: 3.2 g/dL (ref 1.5–4.5)
Potassium: 3.9 mmol/L (ref 3.5–5.2)
Sodium: 133 mmol/L — ABNORMAL LOW (ref 136–144)
Total Protein: 8 g/dL (ref 6.0–8.5)

## 2015-08-26 LAB — LIPID PANEL
CHOLESTEROL TOTAL: 250 mg/dL — AB (ref 100–199)
Chol/HDL Ratio: 4.5 ratio units (ref 0.0–5.0)
HDL: 55 mg/dL (ref >39)
LDL Calculated: 165 mg/dL — ABNORMAL HIGH (ref 0–99)
Triglycerides: 149 mg/dL (ref 0–149)
VLDL CHOLESTEROL CAL: 30 mg/dL (ref 5–40)

## 2015-08-26 LAB — PSA, TOTAL AND FREE
PSA FREE: 0.69 ng/mL
PSA, Free Pct: 13.3 %
Prostate Specific Ag, Serum: 5.2 ng/mL — ABNORMAL HIGH (ref 0.0–4.0)

## 2015-08-26 LAB — HEPATITIS C ANTIBODY: Hep C Virus Ab: <0.1 s/co ratio (ref 0.0–0.9)

## 2015-08-26 LAB — VITAMIN D 25 HYDROXY (VIT D DEFICIENCY, FRACTURES): VIT D 25 HYDROXY: 14.3 ng/mL — AB (ref 30.0–100.0)

## 2015-08-27 ENCOUNTER — Other Ambulatory Visit: Payer: Self-pay | Admitting: Family

## 2015-08-27 DIAGNOSIS — R972 Elevated prostate specific antigen [PSA]: Secondary | ICD-10-CM

## 2015-08-27 MED ORDER — VITAMIN D (ERGOCALCIFEROL) 1.25 MG (50000 UNIT) PO CAPS: 50000.0000 [IU] | ORAL_CAPSULE | ORAL | Status: DC

## 2016-01-12 NOTE — Progress Notes (Signed)
GU Location of Tumor / Histology: prostatic adenocarcinoma  If Prostate Cancer, Gleason Score is (3 + 4) and PSA is (4.58) on 12/02/15  [name] was referred by [name], FNP in November 2016 for evaluation of an elevated PSA.  Biopsies of prostate (if applicable) revealed:    Past/Anticipated interventions by urology, if any: prostate biopsy and discussion about tx options  Past/Anticipated interventions by medical oncology, if any: no  Weight changes, if any: no  Bowel/Bladder complaints, if any: Severe incomplete emptying, frequency, intermittency, urgency, nocturia x 2, weak urine stream. Denies dysuria. Reports seeing dark "old" blood when he voids but, no new bright red blood. Reports incontinence and leakage.  IPSS 23.   Nausea/Vomiting, if any: no  Pain issues, if any:  no  SAFETY ISSUES:  Prior radiation? no  Pacemaker/ICD? no  Possible current pregnancy? no  Is the patient on methotrexate? no  Current Complaints / other details:  60 year old male. NKDA. Married with one son. Everyday smoker. Prostate volume 31.89 cc.

## 2016-01-18 ENCOUNTER — Encounter: Payer: Self-pay | Admitting: Radiation Oncology

## 2016-01-18 ENCOUNTER — Ambulatory Visit
Admission: RE | Admit: 2016-01-18 | Discharge: 2016-01-18 | Disposition: A | Discharge status: Home / Self Care | Payer: Commercial Managed Care - HMO | Source: Ambulatory Visit | Attending: Radiation Oncology | Admitting: Radiation Oncology

## 2016-01-18 VITALS — BP 144/93 | HR 78 | Resp 16 | Ht 73.0 in | Wt 195.1 lb

## 2016-01-18 DIAGNOSIS — N529 Male erectile dysfunction, unspecified: Secondary | ICD-10-CM | POA: Insufficient documentation

## 2016-01-18 DIAGNOSIS — I2510 Atherosclerotic heart disease of native coronary artery without angina pectoris: Secondary | ICD-10-CM | POA: Diagnosis not present

## 2016-01-18 DIAGNOSIS — Z510 Encounter for antineoplastic radiation therapy: Secondary | ICD-10-CM | POA: Diagnosis present

## 2016-01-18 DIAGNOSIS — K219 Gastro-esophageal reflux disease without esophagitis: Secondary | ICD-10-CM | POA: Diagnosis not present

## 2016-01-18 DIAGNOSIS — F1010 Alcohol abuse, uncomplicated: Secondary | ICD-10-CM | POA: Diagnosis not present

## 2016-01-18 DIAGNOSIS — F17200 Nicotine dependence, unspecified, uncomplicated: Secondary | ICD-10-CM | POA: Diagnosis not present

## 2016-01-18 DIAGNOSIS — C61 Malignant neoplasm of prostate: Secondary | ICD-10-CM | POA: Diagnosis present

## 2016-01-18 DIAGNOSIS — I10 Essential (primary) hypertension: Secondary | ICD-10-CM | POA: Diagnosis not present

## 2016-01-18 DIAGNOSIS — I252 Old myocardial infarction: Secondary | ICD-10-CM | POA: Insufficient documentation

## 2016-01-18 DIAGNOSIS — E7800 Pure hypercholesterolemia, unspecified: Secondary | ICD-10-CM | POA: Insufficient documentation

## 2016-01-18 NOTE — Progress Notes (Signed)
See progress note under physician encounter. 

## 2016-01-18 NOTE — Progress Notes (Signed)
Radiation Oncology         [phone] ________________________________  Initial Outpatient Consultation  Name: Paul Murillo  MRN: [id number]  Date: 01/18/2016  DOB: January 04, 1956  [id number] Hawks, FNP  [name], MD   REFERRING PHYSICIAN: [name], MD  DIAGNOSIS: 60 y.o. gentleman with stage T1c adenocarcinoma of the prostate with a Gleason's score of 3+4 and a PSA of 4.58    ICD-9-CM ICD-10-CM   1. Malignant neoplasm of prostate ([place]) [facility] referral to [name] is a 60 y.o. gentleman who was noted to have an elevated PSA of 4.58 by his primary care provider, [name], NP .  Accordingly, he was referred for evaluation in urology by Dr. Gaynelle Arabian on 12/02/15,  digital rectal examination was performed at that time revealing no nodules.  The patient proceeded to transrectal ultrasound with 12 biopsies of the prostate on 12/23/15.  The prostate volume measured 31.89 cc.  Out of 12 core biopsies, 4 were positive.  The maximum Gleason score was 3+4, and this was seen in the right apex. The right base lateral, right mid lateral, and right apex lateral were 3+3=6.  The patient reviewed the biopsy results with his urologist and he has kindly been referred today for discussion of potential radiation treatment options.  PREVIOUS RADIATION THERAPY: No  PAST MEDICAL HISTORY:  has a past medical history of Coronary artery disease; MI (myocardial infarction) ([place]); Hypercholesteremia; Reflux; Hypertension; Syncope; Genitourinary disorder; ED (erectile dysfunction); and Vitamin D deficiency.    PAST SURGICAL HISTORY: Past Surgical History  Procedure Laterality Date  . Total hip arthroplasty    . Appendectomy    . Hernia repair    . Left heart catheterization with coronary angiogram N/A 01/29/2014    Procedure: LEFT HEART CATHETERIZATION WITH CORONARY ANGIOGRAM;  Surgeon: Peter M Martinique, MD;  Location: [facility] CATH LAB;   Service: Cardiovascular;  Laterality: N/A;    FAMILY HISTORY: family history includes Diabetes in his brother; Heart disease in his father; Hypertension in his father. There is no history of Cancer.  SOCIAL HISTORY:  reports that he has been smoking.  He does not have any smokeless tobacco history on file. He reports that he drinks alcohol. He reports that he does not use illicit drugs.  ALLERGIES: Review of patient's allergies indicates no known allergies.  MEDICATIONS:  Current Outpatient Prescriptions  Medication Sig Dispense Refill  . aspirin 81 MG EC tablet Take 81 mg by mouth daily.      Marland Kitchen atorvastatin (LIPITOR) 80 MG tablet Take 1 tablet (80 mg total) by mouth daily. 90 tablet 1  . losartan-hydrochlorothiazide (HYZAAR) 100-12.5 MG tablet Take 1 tablet by mouth daily. 90 tablet 1  . nitroGLYCERIN (NITROSTAT) 0.4 MG SL tablet Place 0.4 mg under the tongue every 5 (five) minutes as needed for chest pain.    . pantoprazole (PROTONIX) 40 MG tablet Take 1 tablet (40 mg total) by mouth daily. 90 tablet 2  . prasugrel (EFFIENT) 10 MG TABS Take 10 mg by mouth daily. Reported on 01/18/2016    . Vitamin D, Ergocalciferol, (DRISDOL) 50000 UNITS CAPS capsule Take 1 capsule (50,000 Units total) by mouth every 7 (seven) days. (Patient not taking: Reported on 01/18/2016) 12 capsule 3   No current facility-administered medications for this encounter.    REVIEW OF SYSTEMS:  A 15 point review of systems is documented in the electronic medical record. This was obtained by the nursing staff. However,  I reviewed this with the patient to discuss relevant findings and make appropriate changes.  Pertinent items are noted in HPI..  The patient completed an IPSS and IIEF questionnaire.  His IPSS score was 23 indicating severe urinary outflow obstructive symptoms.  He indicated that his erectile function is able to complete sexual activity about half the time.   PHYSICAL EXAM: This patient is in no acute distress.   He is alert and oriented.   height is 6\' 1"  (1.854 m) and weight is 195 lb 1.6 oz (88.497 kg). His blood pressure is 144/93 and his pulse is 78. His respiration is 16 and oxygen saturation is 100%.  He exhibits no respiratory distress or labored breathing.  He appears neurologically intact.  His mood is pleasant.  His affect is appropriate.  Please note the digital rectal exam findings described above.  KPS = 100  100 - Normal; no complaints; no evidence of disease. 90   - Able to carry on normal activity; minor signs or symptoms of disease. 80   - Normal activity with effort; some signs or symptoms of disease. 78   - Cares for self; unable to carry on normal activity or to do active work. 60   - Requires occasional assistance, but is able to care for most of his personal needs. 50   - Requires considerable assistance and frequent medical care. 2   - Disabled; requires special care and assistance. 18   - Severely disabled; hospital admission is indicated although death not imminent. 40   - Very sick; hospital admission necessary; active supportive treatment necessary. 10   - Moribund; fatal processes progressing rapidly. 0     - Dead  Karnofsky DA, [place], Craver LS and Burchenal [facility] [phone]) The use of the nitrogen mustards in the palliative treatment of carcinoma: with particular reference to bronchogenic carcinoma Cancer 1 [phone]   LABORATORY DATA:  Lab Results  Component Value Date   WBC 8.5 04/09/2014   HGB 13.6 04/09/2014   HCT 39.1 04/09/2014   MCV 92.0 04/09/2014   PLT 224 04/09/2014   Lab Results  Component Value Date   NA 133* 08/25/2015   K 3.9 08/25/2015   CL 93* 08/25/2015   CO2 23 08/25/2015   Lab Results  Component Value Date   ALT 20 08/25/2015   AST 23 08/25/2015   ALKPHOS 75 08/25/2015   BILITOT 0.7 08/25/2015     RADIOGRAPHY: No results found.    IMPRESSION: This gentleman is a 60 y.o. gentleman with stage T1c adenocarcinoma of the prostate with a  Gleason's score of 3+4 and a PSA of 4.58.  His T-Stage, Gleason's Score, and PSA put him into the intermediate risk group.  Accordingly he is eligible for a variety of potential treatment options including prostatectomy or external beam radiation.  He is not an ideal seed candidate given high IPSS urinary symptoms.  PLAN: Today I reviewed the findings and workup thus far.  We discussed the natural history of prostate cancer.  We reviewed the the implications of T-stage, Gleason's Score, and PSA on decision-making and outcomes in prostate cancer.  We discussed radiation treatment in the management of prostate cancer with regard to the logistics and delivery of external beam radiation treatment as well as the logistics and delivery of prostate brachytherapy.  We compared and contrasted each of these approaches and also compared these against prostatectomy.  The patient has not decided if he wants to move ahead with either prostatectomy  or external beam radiation. The patient is scheduled to meet Dr. Alinda Money tomorrow regarding prostatectomy.  I spent 40 minutes face to face with the patient and more than 50% of that time was spent in counseling and/or coordination of care.   ------------------------------------------------  Sheral Apley. Tammi Klippel, M.D.  This document serves as a record of services personally performed by Tyler Pita, MD. It was created on his behalf by Darcus Austin, a trained medical scribe. The creation of this record is based on the scribe's personal observations and the provider's statements to them. This document has been checked and approved by the attending provider.

## 2016-01-26 ENCOUNTER — Telehealth: Payer: Self-pay | Admitting: Radiation Oncology

## 2016-01-26 NOTE — Telephone Encounter (Signed)
Paul Murillo for the pt to call me back re: his discussion of surgery with Dr. Alinda Money versus if he was ready to proceed with radiation therapy instead.

## 2016-01-27 ENCOUNTER — Telehealth: Payer: Self-pay | Admitting: Radiation Oncology

## 2016-01-27 NOTE — Telephone Encounter (Signed)
LM for pt to call me back re: plans for surgery versus radiation.

## 2016-02-01 ENCOUNTER — Encounter: Payer: Self-pay | Admitting: *Deleted

## 2016-02-01 NOTE — Progress Notes (Signed)
[place] Psychosocial Distress Screening Clinical Social Work  Clinical Social Work was referred by distress screening protocol.  The patient scored a 5 on the Psychosocial Distress Thermometer which indicates moderate distress. Clinical Social Worker phoned pt to assess for distress and other psychosocial needs. CSW reviewed chart, no upcoming appointments at [facility] noted. CSW left supportive message and requested return call.   ONCBCN DISTRESS SCREENING 01/18/2016  Screening Type Initial Screening  Distress experienced in past week (1-10) 5  Physical Problem type Changes in urination;Tingling hands/feet;Sexual problems  Physician notified of physical symptoms Yes  Referral to clinical psychology No  Referral to clinical social work Yes  Referral to dietition No  Referral to financial advocate No  Referral to support programs No  Referral to palliative care No  Other requested to be contacted via cell phone at [phone]    Clinical Social Worker follow up needed: No.  If yes, follow up plan:  Loren Racer, [place]  [facility] Phone: [phone] Fax: [phone]

## 2016-02-03 ENCOUNTER — Encounter: Payer: Self-pay | Admitting: *Deleted

## 2016-02-03 NOTE — Progress Notes (Signed)
[place] Work  Clinical Social Work received return call from pt. He reports he had been trying to get in touch with the [facility] to schedule his next appointments. Pt denied psychosocial concerns, just eager to get tx started. 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 He would prefer we try his work extension [phone] and then his cell phone in order to communicate. CSW will inform team of his request.   Loren Racer, [place] Worker [place]  [facility] Phone: [phone] Fax: [phone]

## 2016-02-04 ENCOUNTER — Telehealth: Payer: Self-pay | Admitting: Radiation Oncology

## 2016-02-04 NOTE — Telephone Encounter (Signed)
error 

## 2016-02-10 ENCOUNTER — Other Ambulatory Visit: Payer: Self-pay | Admitting: Family

## 2016-02-11 DIAGNOSIS — I2510 Atherosclerotic heart disease of native coronary artery without angina pectoris: Secondary | ICD-10-CM | POA: Insufficient documentation

## 2016-02-11 DIAGNOSIS — Z955 Presence of coronary angioplasty implant and graft: Secondary | ICD-10-CM | POA: Insufficient documentation

## 2016-02-18 ENCOUNTER — Ambulatory Visit
Admission: RE | Admit: 2016-02-18 | Discharge: 2016-02-18 | Disposition: A | Discharge status: Home / Self Care | Payer: Commercial Managed Care - HMO | Source: Ambulatory Visit | Attending: Radiation Oncology | Admitting: Radiation Oncology

## 2016-02-18 DIAGNOSIS — C61 Malignant neoplasm of prostate: Secondary | ICD-10-CM

## 2016-02-18 NOTE — Progress Notes (Signed)
  Radiation Oncology         [phone]) [phone] ________________________________  Name: Paul Murillo  MRN: [id number]  Date: 02/18/2016  DOB: Jul 12, 1956  SIMULATION AND TREATMENT PLANNING NOTE    ICD-9-CM ICD-10-CM   1. Malignant neoplasm of prostate ([place]) 185 C61     DIAGNOSIS:  60 y.o. gentleman with stage T1c adenocarcinoma of the prostate with a Gleason's score of 3+4 and a PSA of 4.58  NARRATIVE:  The patient was brought to the [place].  Identity was confirmed.  All relevant records and images related to the planned course of therapy were reviewed.  The patient freely provided informed written consent to proceed with treatment after reviewing the details related to the planned course of therapy. The consent form was witnessed and verified by the simulation staff.  Then, on discussion, it was determined the patient has not had his gold fiducial markers placed for IMRT.  PLAN:  The patient will return to Dr. Gaynelle Arabian for gold fiducials and return later for IMRT simulation.  ________________________________  Sheral Apley Tammi Klippel, M.D.    This document serves as a record of services personally performed by Tyler Pita, MD. It was created on his behalf by Lendon Collar, a trained medical scribe. The creation of this record is based on the scribe's personal observations and the provider's statements to them. This document has been checked and approved by the attending provider.

## 2016-02-22 ENCOUNTER — Encounter: Payer: Self-pay | Admitting: Family

## 2016-02-22 ENCOUNTER — Ambulatory Visit (INDEPENDENT_AMBULATORY_CARE_PROVIDER_SITE_OTHER): Payer: Commercial Managed Care - HMO | Admitting: Family

## 2016-02-22 ENCOUNTER — Encounter: Payer: Self-pay | Admitting: *Deleted

## 2016-02-22 VITALS — BP 132/92 | HR 96 | Temp 97.5°F | Ht 73.0 in | Wt 195.0 lb

## 2016-02-22 DIAGNOSIS — N529 Male erectile dysfunction, unspecified: Secondary | ICD-10-CM

## 2016-02-22 DIAGNOSIS — C61 Malignant neoplasm of prostate: Secondary | ICD-10-CM | POA: Diagnosis not present

## 2016-02-22 DIAGNOSIS — E785 Hyperlipidemia, unspecified: Secondary | ICD-10-CM | POA: Diagnosis not present

## 2016-02-22 DIAGNOSIS — I10 Essential (primary) hypertension: Secondary | ICD-10-CM | POA: Diagnosis not present

## 2016-02-22 DIAGNOSIS — K219 Gastro-esophageal reflux disease without esophagitis: Secondary | ICD-10-CM

## 2016-02-22 DIAGNOSIS — E559 Vitamin D deficiency, unspecified: Secondary | ICD-10-CM | POA: Diagnosis not present

## 2016-02-22 DIAGNOSIS — Z720 Tobacco use: Secondary | ICD-10-CM | POA: Diagnosis not present

## 2016-02-22 DIAGNOSIS — Z23 Encounter for immunization: Secondary | ICD-10-CM

## 2016-02-22 DIAGNOSIS — R7309 Other abnormal glucose: Secondary | ICD-10-CM

## 2016-02-22 DIAGNOSIS — F17200 Nicotine dependence, unspecified, uncomplicated: Secondary | ICD-10-CM

## 2016-02-22 NOTE — Patient Instructions (Signed)

## 2016-02-22 NOTE — Addendum Note (Signed)
Addended by: Marylin Crosby on: 02/22/2016 09:13 AM   Modules accepted: Orders

## 2016-02-22 NOTE — Progress Notes (Signed)
**Note Paul-Identified via Obfuscation** Subjective:    Patient ID: Paul Murillo, male    DOB: 06-Dec-1955, 60 y.o.   MRN: [id number]  Pt presents to the office today for chronic follow up. Pt is currently being treated for prostate cancer and is followed by Urology. Pt states he is getting set for radiation.   Hypertension This is a chronic problem. The current episode started more than 1 year ago. The problem has been waxing and waning since onset. The problem is uncontrolled. Pertinent negatives include no anxiety, blurred vision, headaches, palpitations, peripheral edema or shortness of breath. Risk factors for coronary artery disease include dyslipidemia, male gender, sedentary lifestyle, smoking/tobacco exposure, stress and family history. Past treatments include diuretics and angiotensin blockers. The current treatment provides mild improvement. Hypertensive end-organ damage includes CAD/MI. There is no history of kidney disease, CVA, heart failure or a thyroid problem. There is no history of sleep apnea.  Hyperlipidemia This is a chronic problem. The current episode started more than 1 year ago. The problem is uncontrolled. Recent lipid tests were reviewed and are high. He has no history of diabetes. Factors aggravating his hyperlipidemia include smoking. Pertinent negatives include no shortness of breath. Current antihyperlipidemic treatment includes statins (Pt states he has been out of his medications for a few months). Compliance problems include adherence to exercise and medication side effects.  Risk factors for coronary artery disease include dyslipidemia, family history, hypertension, male sex, post-menopausal and stress.  Gastroesophageal Reflux He reports no belching, no coughing or no heartburn. This is a chronic problem. The current episode started more than 1 year ago. The problem occurs rarely. The problem has been resolved. The symptoms are aggravated by certain foods. He has tried a PPI for the symptoms. The treatment  provided significant relief.      Review of Systems  Constitutional: Negative.   HENT: Negative.   Eyes: Negative for blurred vision.  Respiratory: Negative.  Negative for cough and shortness of breath.   Cardiovascular: Negative.  Negative for palpitations.  Gastrointestinal: Negative.  Negative for heartburn.  Endocrine: Negative.   Genitourinary: Negative.   Musculoskeletal: Negative.   Neurological: Negative.  Negative for headaches.  Hematological: Negative.   Psychiatric/Behavioral: Negative.   All other systems reviewed and are negative.      Objective:   Physical Exam  Constitutional: He is oriented to person, place, and time. He appears well-developed and well-nourished. No distress.  HENT:  Head: Normocephalic.  Right Ear: External ear normal.  Left Ear: External ear normal.  Nose: Nose normal.  Mouth/Throat: Oropharynx is clear and moist.  Eyes: Pupils are equal, round, and reactive to light. Right eye exhibits no discharge. Left eye exhibits no discharge.  Neck: Normal range of motion. Neck supple. No thyromegaly present.  Cardiovascular: Normal rate, regular rhythm, normal heart sounds and intact distal pulses.   No murmur heard. Pulmonary/Chest: Effort normal and breath sounds normal. No respiratory distress. He has no wheezes.  Abdominal: Soft. Bowel sounds are normal. He exhibits no distension. There is no tenderness.  Musculoskeletal: Normal range of motion. He exhibits no edema or tenderness.  Neurological: He is alert and oriented to person, place, and time. He has normal reflexes. No cranial nerve deficit.  Skin: Skin is warm and dry. No rash noted. No erythema.  Psychiatric: He has a normal mood and affect. His behavior is normal. Judgment and thought content normal.  Vitals reviewed.    BP 154/92 mmHg  Pulse 101  Temp(Src) 97.5 F (36.4 C) (Oral)  Ht _0  (1.854 m)  Wt 195 lb (88.451 kg)  BMI 25.73 kg/m2      Assessment & Plan:  1.  Vitamin D deficiency - CMP14+EGFR - VITAMIN D 25 Hydroxy (Vit-D Deficiency, Fractures)  2. Smoker - CMP14+EGFR  3. Hyperlipemia - CMP14+EGFR - Lipid panel  4. Gastroesophageal reflux disease, esophagitis presence not specified - CMP14+EGFR  5. Essential hypertension - CMP14+EGFR  6. Erectile dysfunction, unspecified erectile dysfunction type - CMP14+EGFR  7. Malignant neoplasm of prostate ([place]) - CMP14+EGFR   Continue all meds Labs pending Health Maintenance reviewed-Zostavax given today Diet and exercise encouraged RTO 6 months  [name], FNP

## 2016-02-23 ENCOUNTER — Other Ambulatory Visit: Payer: Self-pay | Admitting: Family

## 2016-02-23 DIAGNOSIS — R7309 Other abnormal glucose: Secondary | ICD-10-CM

## 2016-02-23 LAB — CMP14+EGFR
ALK PHOS: 77 IU/L (ref 39–117)
ALT: 26 IU/L (ref 0–44)
AST: 29 IU/L (ref 0–40)
Albumin/Globulin Ratio: 1.5 (ref 1.2–2.2)
Albumin: 4.5 g/dL (ref 3.6–4.8)
BUN/Creatinine Ratio: 13 (ref 10–24)
BUN: 16 mg/dL (ref 8–27)
Bilirubin Total: 0.5 mg/dL (ref 0.0–1.2)
CALCIUM: 10 mg/dL (ref 8.6–10.2)
CO2: 21 mmol/L (ref 18–29)
CREATININE: 1.24 mg/dL (ref 0.76–1.27)
Chloride: 94 mmol/L — ABNORMAL LOW (ref 96–106)
GFR calc Af Amer: 72 mL/min/{1.73_m2} (ref >59)
GFR, EST NON AFRICAN AMERICAN: 62 mL/min/{1.73_m2} (ref >59)
GLOBULIN, TOTAL: 3.1 g/dL (ref 1.5–4.5)
GLUCOSE: 139 mg/dL — AB (ref 65–99)
Potassium: 3.7 mmol/L (ref 3.5–5.2)
Sodium: 136 mmol/L (ref 134–144)
Total Protein: 7.6 g/dL (ref 6.0–8.5)

## 2016-02-23 LAB — LIPID PANEL
CHOL/HDL RATIO: 3 ratio (ref 0.0–5.0)
CHOLESTEROL TOTAL: 162 mg/dL (ref 100–199)
HDL: 54 mg/dL (ref >39)
LDL CALC: 77 mg/dL (ref 0–99)
TRIGLYCERIDES: 155 mg/dL — AB (ref 0–149)
VLDL CHOLESTEROL CAL: 31 mg/dL (ref 5–40)

## 2016-02-23 LAB — VITAMIN D 25 HYDROXY (VIT D DEFICIENCY, FRACTURES): Vit D, 25-Hydroxy: 20.1 ng/mL — ABNORMAL LOW (ref 30.0–100.0)

## 2016-02-23 LAB — BAYER DCA HB A1C WAIVED: HB A1C: 6 % (ref <7.0)

## 2016-02-23 MED ORDER — VITAMIN D (ERGOCALCIFEROL) 1.25 MG (50000 UNIT) PO CAPS: 50000.0000 [IU] | ORAL_CAPSULE | ORAL | Status: DC

## 2016-02-23 NOTE — Addendum Note (Signed)
Addended by: Pollyann Kennedy F on: 02/23/2016 01:20 PM   Modules accepted: Orders

## 2016-02-24 ENCOUNTER — Telehealth: Payer: Self-pay | Admitting: *Deleted

## 2016-02-24 NOTE — Telephone Encounter (Signed)
Called patient to inform of gold seed placement on 03-08-16 - arrival time - 2:15 pm @ Dr. Arlyn Leak Office, spoke with patient and he is aware of this appt.

## 2016-02-25 ENCOUNTER — Ambulatory Visit (INDEPENDENT_AMBULATORY_CARE_PROVIDER_SITE_OTHER): Payer: Commercial Managed Care - HMO | Admitting: Family

## 2016-02-25 ENCOUNTER — Encounter: Payer: Self-pay | Admitting: Family

## 2016-02-25 VITALS — BP 139/94 | HR 100 | Temp 98.4°F | Ht 73.0 in | Wt 190.0 lb

## 2016-02-25 DIAGNOSIS — R404 Transient alteration of awareness: Secondary | ICD-10-CM

## 2016-02-25 DIAGNOSIS — R4020 Unspecified coma: Secondary | ICD-10-CM

## 2016-02-25 DIAGNOSIS — R55 Syncope and collapse: Secondary | ICD-10-CM | POA: Diagnosis not present

## 2016-02-25 DIAGNOSIS — I10 Essential (primary) hypertension: Secondary | ICD-10-CM | POA: Diagnosis not present

## 2016-02-25 NOTE — Patient Instructions (Signed)

## 2016-02-25 NOTE — Progress Notes (Signed)
   Subjective:    Patient ID: Paul Murillo, male    DOB: 04/19/1956, 60 y.o.   MRN: [id number]  Loss of Consciousness This is a recurrent problem. The current episode started yesterday. Pertinent negatives include no chest pain, clumsiness, headaches, malaise/fatigue, palpitations, slurred speech, vertigo or visual change.   Pt presents to the office today with complaints of getting hot and clammy and "passed out". Pt states this has happened three times and he has went to the ED and states he had a negative EKG and lab work. Pt states the last time this happened it was yesterday at 5 AM. Pt states he was sitting at a table looking a paperwork. Pt state he lost consciousness  For about 1-2 seconds. Pt states he has had a "runny nose", sneezing, and scratchy throat. Pt state he has been taking a decongestant. Pt states he had the Zostavax on Monday. Pt has a Cardiologists that he saw on 02/17/16 and had a negative EKG then and was told to follow up in 6 months.    Review of Systems  Constitutional: Negative for malaise/fatigue.  Cardiovascular: Positive for syncope. Negative for chest pain and palpitations.  Neurological: Negative for vertigo and headaches.  All other systems reviewed and are negative.      Objective:   Physical Exam  Constitutional: He is oriented to person, place, and time. He appears well-developed and well-nourished. No distress.  HENT:  Head: Normocephalic.  Right Ear: External ear normal.  Left Ear: External ear normal.  Mouth/Throat: Oropharynx is clear and moist.  Eyes: Pupils are equal, round, and reactive to light. Right eye exhibits no discharge. Left eye exhibits no discharge.  Neck: Normal range of motion. Neck supple. No thyromegaly present.  Cardiovascular: Normal rate, regular rhythm, normal heart sounds and intact distal pulses.   No murmur heard. Pulmonary/Chest: Effort normal and breath sounds normal. No respiratory distress. He has no wheezes.    Abdominal: Soft. Bowel sounds are normal. He exhibits no distension. There is no tenderness.  Musculoskeletal: Normal range of motion. He exhibits no edema or tenderness.  Neurological: He is alert and oriented to person, place, and time. He has normal reflexes. No cranial nerve deficit.  Skin: Skin is warm and dry. No rash noted. No erythema.  Psychiatric: He has a normal mood and affect. His behavior is normal. Judgment and thought content normal.  Vitals reviewed.  EKG Sinus Rhythm    BP 139/94 mmHg  Pulse 100  Temp(Src) 98.4 F (36.9 C) (Oral)  Ht 6' 1" (1.854 m)  Wt 190 lb (86.183 kg)  BMI 25.07 kg/m2     Assessment & Plan:  1. Loss of consciousness - Anemia Profile B - CMP14+EGFR - EKG 12-Lead  2. SYNCOPE - Anemia Profile B - CMP14+EGFR - EKG 12-Lead  3. Essential hypertension - Anemia Profile B - CMP14+EGFR - EKG 12-Lead  -Labs pending -Could be related to vagal?  -PT to keep appt with Cardiologists  -VS stable  -Keep chronic follow up appts and RTO prn  Christy Hawks, FNP  

## 2016-02-26 ENCOUNTER — Telehealth: Payer: Self-pay | Admitting: *Deleted

## 2016-02-26 LAB — CMP14+EGFR
A/G RATIO: 1.7 (ref 1.2–2.2)
ALK PHOS: 80 IU/L (ref 39–117)
ALT: 26 IU/L (ref 0–44)
AST: 33 IU/L (ref 0–40)
Albumin: 4.8 g/dL (ref 3.6–4.8)
BUN/Creatinine Ratio: 8 — ABNORMAL LOW (ref 10–24)
BUN: 12 mg/dL (ref 8–27)
Bilirubin Total: 0.4 mg/dL (ref 0.0–1.2)
CHLORIDE: 95 mmol/L — AB (ref 96–106)
CO2: 24 mmol/L (ref 18–29)
Calcium: 9.5 mg/dL (ref 8.6–10.2)
Creatinine, Ser: 1.42 mg/dL — ABNORMAL HIGH (ref 0.76–1.27)
GFR calc Af Amer: 61 mL/min/{1.73_m2} (ref >59)
GFR, EST NON AFRICAN AMERICAN: 53 mL/min/{1.73_m2} — AB (ref >59)
GLOBULIN, TOTAL: 2.8 g/dL (ref 1.5–4.5)
Glucose: 98 mg/dL (ref 65–99)
POTASSIUM: 3.9 mmol/L (ref 3.5–5.2)
Sodium: 139 mmol/L (ref 134–144)
Total Protein: 7.6 g/dL (ref 6.0–8.5)

## 2016-02-26 LAB — ANEMIA PROFILE B
BASOS: 1 %
Basophils Absolute: 0 10*3/uL (ref 0.0–0.2)
EOS (ABSOLUTE): 0.5 10*3/uL — ABNORMAL HIGH (ref 0.0–0.4)
EOS: 7 %
FERRITIN: 682 ng/mL — AB (ref 30–400)
FOLATE: 8 ng/mL (ref >3.0)
HEMATOCRIT: 39 % (ref 37.5–51.0)
HEMOGLOBIN: 14.1 g/dL (ref 12.6–17.7)
IRON SATURATION: 17 % (ref 15–55)
Immature Grans (Abs): 0 10*3/uL (ref 0.0–0.1)
Immature Granulocytes: 0 %
Iron: 48 ug/dL (ref 38–169)
LYMPHS ABS: 2.2 10*3/uL (ref 0.7–3.1)
Lymphs: 33 %
MCH: 31.8 pg (ref 26.6–33.0)
MCHC: 36.2 g/dL — AB (ref 31.5–35.7)
MCV: 88 fL (ref 79–97)
Monocytes Absolute: 0.9 10*3/uL (ref 0.1–0.9)
Monocytes: 14 %
NEUTROS ABS: 3 10*3/uL (ref 1.4–7.0)
Neutrophils: 45 %
Platelets: 221 10*3/uL (ref 150–379)
RBC: 4.44 x10E6/uL (ref 4.14–5.80)
RDW: 14 % (ref 12.3–15.4)
Retic Ct Pct: 0.8 % (ref 0.6–2.6)
Total Iron Binding Capacity: 282 ug/dL (ref 250–450)
UIBC: 234 ug/dL (ref 111–343)
Vitamin B-12: 587 pg/mL (ref 211–946)
WBC: 6.6 10*3/uL (ref 3.4–10.8)

## 2016-02-26 NOTE — Telephone Encounter (Signed)
CALLED PATIENT TO INFORM OF GOLD SEED PLACEMENT FOR 03-08-16 @ DR. TANNENBAUM'S OFFICE AND HIS SIM ON 03-11-16 @ 1 PM @ DR. MANNING'S OFFICE, SPOKE WITH PATIENT AND HE IS AWARE OF THESE APPTS.

## 2016-02-29 ENCOUNTER — Ambulatory Visit: Payer: Commercial Managed Care - HMO | Admitting: Radiation Oncology

## 2016-03-01 ENCOUNTER — Ambulatory Visit: Payer: Commercial Managed Care - HMO

## 2016-03-02 ENCOUNTER — Other Ambulatory Visit: Payer: Self-pay | Admitting: Family

## 2016-03-02 ENCOUNTER — Ambulatory Visit: Payer: Commercial Managed Care - HMO

## 2016-03-03 ENCOUNTER — Ambulatory Visit: Payer: Commercial Managed Care - HMO

## 2016-03-04 ENCOUNTER — Ambulatory Visit: Payer: Commercial Managed Care - HMO

## 2016-03-07 ENCOUNTER — Ambulatory Visit: Payer: Commercial Managed Care - HMO

## 2016-03-08 ENCOUNTER — Ambulatory Visit: Payer: Commercial Managed Care - HMO

## 2016-03-09 ENCOUNTER — Ambulatory Visit: Payer: Commercial Managed Care - HMO

## 2016-03-10 ENCOUNTER — Ambulatory Visit: Payer: Commercial Managed Care - HMO

## 2016-03-11 ENCOUNTER — Ambulatory Visit: Payer: Commercial Managed Care - HMO

## 2016-03-11 ENCOUNTER — Ambulatory Visit
Admission: RE | Admit: 2016-03-11 | Discharge: 2016-03-11 | Disposition: A | Discharge status: Home / Self Care | Payer: Commercial Managed Care - HMO | Source: Ambulatory Visit | Attending: Radiation Oncology | Admitting: Radiation Oncology

## 2016-03-11 DIAGNOSIS — Z510 Encounter for antineoplastic radiation therapy: Secondary | ICD-10-CM | POA: Diagnosis not present

## 2016-03-11 DIAGNOSIS — C61 Malignant neoplasm of prostate: Secondary | ICD-10-CM

## 2016-03-11 NOTE — Progress Notes (Signed)
  Radiation Oncology         [phone]) [phone] ________________________________  Name: Paul Murillo MRN: [id number]  Date: 03/11/2016  DOB: [birth year]  SIMULATION AND TREATMENT PLANNING NOTE    ICD-9-CM ICD-10-CM   1. Malignant neoplasm of prostate ([place]) 185 C61     DIAGNOSIS:   60 y.o. gentleman with stage T1c adenocarcinoma of the prostate with a Gleason's score of 3+4 and a PSA of 4.58  NARRATIVE:  The patient was brought to the [place].  Identity was confirmed.  All relevant records and images related to the planned course of therapy were reviewed.  The patient freely provided informed written consent to proceed with treatment after reviewing the details related to the planned course of therapy. The consent form was witnessed and verified by the simulation staff.  Then, the patient was set-up in a stable reproducible supine position for radiation therapy.  A vacuum lock pillow device was custom fabricated to position his legs in a reproducible immobilized position.  Then, I performed a urethrogram under sterile conditions to identify the prostatic apex.  CT images were obtained.  Surface markings were placed.  The CT images were loaded into the planning software.  Then the prostate target and avoidance structures including the rectum, bladder, bowel and hips were contoured.  Treatment planning then occurred.  The radiation prescription was entered and confirmed.  A total of one complex treatment device was fabricated. I have requested : Intensity Modulated Radiotherapy (IMRT) is medically necessary for this case for the following reason:  Rectal sparing.Marland Kitchen  PLAN:  The patient will receive 78 Gy in 40 fractions.  ________________________________  Sheral Apley Tammi Klippel, M.D.

## 2016-03-14 ENCOUNTER — Ambulatory Visit: Payer: Commercial Managed Care - HMO

## 2016-03-15 ENCOUNTER — Ambulatory Visit: Payer: Commercial Managed Care - HMO

## 2016-03-16 ENCOUNTER — Ambulatory Visit: Payer: Commercial Managed Care - HMO

## 2016-03-17 ENCOUNTER — Ambulatory Visit: Payer: Commercial Managed Care - HMO

## 2016-03-18 ENCOUNTER — Ambulatory Visit: Payer: Commercial Managed Care - HMO

## 2016-03-18 DIAGNOSIS — Z510 Encounter for antineoplastic radiation therapy: Secondary | ICD-10-CM | POA: Diagnosis not present

## 2016-03-22 ENCOUNTER — Ambulatory Visit: Payer: Commercial Managed Care - HMO

## 2016-03-22 DIAGNOSIS — Z510 Encounter for antineoplastic radiation therapy: Secondary | ICD-10-CM | POA: Diagnosis not present

## 2016-03-23 ENCOUNTER — Ambulatory Visit
Admission: RE | Admit: 2016-03-23 | Discharge: 2016-03-23 | Disposition: A | Discharge status: Home / Self Care | Payer: Commercial Managed Care - HMO | Source: Ambulatory Visit | Attending: Radiation Oncology | Admitting: Radiation Oncology

## 2016-03-23 ENCOUNTER — Ambulatory Visit: Payer: Commercial Managed Care - HMO

## 2016-03-23 DIAGNOSIS — Z510 Encounter for antineoplastic radiation therapy: Secondary | ICD-10-CM | POA: Diagnosis not present

## 2016-03-24 ENCOUNTER — Ambulatory Visit
Admission: RE | Admit: 2016-03-24 | Discharge: 2016-03-24 | Disposition: A | Discharge status: Home / Self Care | Payer: Commercial Managed Care - HMO | Source: Ambulatory Visit | Attending: Radiation Oncology | Admitting: Radiation Oncology

## 2016-03-24 ENCOUNTER — Ambulatory Visit: Payer: Commercial Managed Care - HMO

## 2016-03-24 DIAGNOSIS — Z510 Encounter for antineoplastic radiation therapy: Secondary | ICD-10-CM | POA: Diagnosis not present

## 2016-03-25 ENCOUNTER — Ambulatory Visit
Admission: RE | Admit: 2016-03-25 | Discharge: 2016-03-25 | Disposition: A | Discharge status: Home / Self Care | Payer: Commercial Managed Care - HMO | Source: Ambulatory Visit | Attending: Radiation Oncology | Admitting: Radiation Oncology

## 2016-03-25 ENCOUNTER — Encounter: Payer: Self-pay | Admitting: Radiation Oncology

## 2016-03-25 ENCOUNTER — Ambulatory Visit: Payer: Commercial Managed Care - HMO

## 2016-03-25 VITALS — BP 133/76 | HR 87 | Resp 16 | Wt 193.4 lb

## 2016-03-25 DIAGNOSIS — Z510 Encounter for antineoplastic radiation therapy: Secondary | ICD-10-CM | POA: Diagnosis not present

## 2016-03-25 DIAGNOSIS — C61 Malignant neoplasm of prostate: Secondary | ICD-10-CM

## 2016-03-25 NOTE — Progress Notes (Signed)
Weight and vitals stable. Denies pain. Reports nocturia x 2. Denies dysuria or hematuria. Reports urinary urgency. Reports occasional difficulty emptying his bladder. Reports daily urinary leakage. Denies diarrhea. Reports fatigue "associated with age."  BP 133/76 mmHg  Pulse 87  Resp 16  Wt 193 lb 6.4 oz (87.726 kg)  SpO2 100% Wt Readings from Last 3 Encounters:  03/25/16 193 lb 6.4 oz (87.726 kg)  02/25/16 190 lb (86.183 kg)  02/22/16 195 lb (88.451 kg)

## 2016-03-25 NOTE — Progress Notes (Signed)
  Radiation Oncology         [phone]   Name: Paul Murillo MRN: [id number]   Date: 03/25/2016  DOB: 1955/12/28     Weekly Radiation Therapy Management    ICD-9-CM ICD-10-CM   1. Malignant neoplasm of prostate ([place]) 185 C61     Current Dose: 5.85 Gy  Planned Dose:  78 Gy  Narrative The patient presents for routine under treatment assessment.  Weight and vitals stable. Denies pain. Reports nocturia x 2. Denies dysuria or hematuria. Reports urinary urgency. Reports occasional difficulty emptying his bladder. Reports daily urinary leakage. Denies diarrhea. Reports fatigue "associated with age."  The patient is without complaint. Set-up films were reviewed. The chart was checked.  Physical Findings  weight is 193 lb 6.4 oz (87.726 kg). His blood pressure is 133/76 and his pulse is 87. His respiration is 16 and oxygen saturation is 100%. . Weight essentially stable.  No significant changes.  Impression The patient is tolerating radiation.  Plan Continue treatment as planned.         Sheral Apley Tammi Klippel, M.D.    This document serves as a record of services personally performed by Tyler Pita, MD. It was created on his behalf by Lendon Collar, a trained medical scribe. The creation of this record is based on the scribe's personal observations and the provider's statements to them. This document has been checked and approved by the attending provider.

## 2016-03-28 ENCOUNTER — Ambulatory Visit
Admission: RE | Admit: 2016-03-28 | Discharge: 2016-03-28 | Disposition: A | Discharge status: Home / Self Care | Payer: Commercial Managed Care - HMO | Source: Ambulatory Visit | Attending: Radiation Oncology | Admitting: Radiation Oncology

## 2016-03-28 ENCOUNTER — Ambulatory Visit: Payer: Commercial Managed Care - HMO

## 2016-03-28 DIAGNOSIS — Z510 Encounter for antineoplastic radiation therapy: Secondary | ICD-10-CM | POA: Diagnosis not present

## 2016-03-29 ENCOUNTER — Ambulatory Visit: Payer: Commercial Managed Care - HMO

## 2016-03-29 ENCOUNTER — Ambulatory Visit
Admission: RE | Admit: 2016-03-29 | Discharge: 2016-03-29 | Disposition: A | Discharge status: Home / Self Care | Payer: Commercial Managed Care - HMO | Source: Ambulatory Visit | Attending: Radiation Oncology | Admitting: Radiation Oncology

## 2016-03-29 DIAGNOSIS — Z510 Encounter for antineoplastic radiation therapy: Secondary | ICD-10-CM | POA: Diagnosis not present

## 2016-03-30 ENCOUNTER — Ambulatory Visit: Payer: Commercial Managed Care - HMO

## 2016-03-30 ENCOUNTER — Ambulatory Visit
Admission: RE | Admit: 2016-03-30 | Discharge: 2016-03-30 | Disposition: A | Discharge status: Home / Self Care | Payer: Commercial Managed Care - HMO | Source: Ambulatory Visit | Attending: Radiation Oncology | Admitting: Radiation Oncology

## 2016-03-30 DIAGNOSIS — Z510 Encounter for antineoplastic radiation therapy: Secondary | ICD-10-CM | POA: Diagnosis not present

## 2016-03-31 ENCOUNTER — Ambulatory Visit
Admission: RE | Admit: 2016-03-31 | Discharge: 2016-03-31 | Disposition: A | Discharge status: Home / Self Care | Payer: Commercial Managed Care - HMO | Source: Ambulatory Visit | Attending: Radiation Oncology | Admitting: Radiation Oncology

## 2016-03-31 ENCOUNTER — Ambulatory Visit: Payer: Commercial Managed Care - HMO

## 2016-03-31 DIAGNOSIS — Z510 Encounter for antineoplastic radiation therapy: Secondary | ICD-10-CM | POA: Diagnosis not present

## 2016-04-01 ENCOUNTER — Encounter: Payer: Self-pay | Admitting: Radiation Oncology

## 2016-04-01 ENCOUNTER — Ambulatory Visit
Admission: RE | Admit: 2016-04-01 | Discharge: 2016-04-01 | Disposition: A | Discharge status: Home / Self Care | Payer: Commercial Managed Care - HMO | Source: Ambulatory Visit | Attending: Radiation Oncology | Admitting: Radiation Oncology

## 2016-04-01 ENCOUNTER — Ambulatory Visit: Payer: Commercial Managed Care - HMO

## 2016-04-01 VITALS — BP 119/72 | HR 86 | Temp 98.2°F | Resp 16 | Wt 195.5 lb

## 2016-04-01 DIAGNOSIS — Z510 Encounter for antineoplastic radiation therapy: Secondary | ICD-10-CM | POA: Diagnosis not present

## 2016-04-01 DIAGNOSIS — C61 Malignant neoplasm of prostate: Secondary | ICD-10-CM

## 2016-04-01 NOTE — Progress Notes (Signed)
  Radiation Oncology         [phone]   Name: Paul Murillo MRN: [id number]   Date: 04/01/2016  DOB: 1956/04/16     Weekly Radiation Therapy Management    ICD-9-CM ICD-10-CM   1. Malignant neoplasm of prostate ([place]) 185 C61     Current Dose: 15.6 Gy  Planned Dose:  78 Gy  Narrative The patient presents for routine under treatment assessment.  Weight and vitals stable. Denies pain. Reports nocturia x 2. Denies dysuria or hematuria. Reports urinary urgency. Reports occasional difficulty emptying his bladder. Reports daily urinary leakage. Denies diarrhea. Denies fatigue.   The patient is without complaint. Set-up films were reviewed. The chart was checked.  Physical Findings  weight is 195 lb 8 oz (88.678 kg). His oral temperature is 98.2 F (36.8 C). His blood pressure is 119/72 and his pulse is 86. His respiration is 16. . Weight essentially stable.  No significant changes.  Impression The patient is tolerating radiation.  Plan Continue treatment as planned.     Sheral Apley Tammi Klippel, M.D.   This document serves as a record of services personally performed by Tyler Pita, MD. It was created on his behalf by Derek Mound, a trained medical scribe. The creation of this record is based on the scribe's personal observations and the provider's statements to them. This document has been checked and approved by the attending provider.

## 2016-04-01 NOTE — Progress Notes (Signed)
Weight and vitals stable. Denies pain. Reports nocturia x 2. Denies dysuria or hematuria. Reports urinary urgency. Reports occasional difficulty emptying his bladder. Reports daily urinary leakage. Denies diarrhea. Denies fatigue.   BP 119/72 mmHg  Pulse 86  Temp(Src) 98.2 F (36.8 C) (Oral)  Resp 16  Wt 195 lb 8 oz (88.678 kg) Wt Readings from Last 3 Encounters:  04/01/16 195 lb 8 oz (88.678 kg)  03/25/16 193 lb 6.4 oz (87.726 kg)  02/25/16 190 lb (86.183 kg)

## 2016-04-04 ENCOUNTER — Ambulatory Visit
Admission: RE | Admit: 2016-04-04 | Discharge: 2016-04-04 | Disposition: A | Discharge status: Home / Self Care | Payer: Commercial Managed Care - HMO | Source: Ambulatory Visit | Attending: Radiation Oncology | Admitting: Radiation Oncology

## 2016-04-04 ENCOUNTER — Ambulatory Visit: Payer: Commercial Managed Care - HMO

## 2016-04-04 DIAGNOSIS — Z510 Encounter for antineoplastic radiation therapy: Secondary | ICD-10-CM | POA: Diagnosis not present

## 2016-04-05 ENCOUNTER — Ambulatory Visit: Payer: Commercial Managed Care - HMO

## 2016-04-05 ENCOUNTER — Ambulatory Visit
Admission: RE | Admit: 2016-04-05 | Discharge: 2016-04-05 | Disposition: A | Discharge status: Home / Self Care | Payer: Commercial Managed Care - HMO | Source: Ambulatory Visit | Attending: Radiation Oncology | Admitting: Radiation Oncology

## 2016-04-05 DIAGNOSIS — Z510 Encounter for antineoplastic radiation therapy: Secondary | ICD-10-CM | POA: Diagnosis not present

## 2016-04-06 ENCOUNTER — Ambulatory Visit: Payer: Commercial Managed Care - HMO

## 2016-04-06 ENCOUNTER — Ambulatory Visit
Admission: RE | Admit: 2016-04-06 | Discharge: 2016-04-06 | Disposition: A | Discharge status: Home / Self Care | Payer: Commercial Managed Care - HMO | Source: Ambulatory Visit | Attending: Radiation Oncology | Admitting: Radiation Oncology

## 2016-04-06 DIAGNOSIS — Z510 Encounter for antineoplastic radiation therapy: Secondary | ICD-10-CM | POA: Diagnosis not present

## 2016-04-07 ENCOUNTER — Ambulatory Visit
Admission: RE | Admit: 2016-04-07 | Discharge: 2016-04-07 | Disposition: A | Discharge status: Home / Self Care | Payer: Commercial Managed Care - HMO | Source: Ambulatory Visit | Attending: Radiation Oncology | Admitting: Radiation Oncology

## 2016-04-07 ENCOUNTER — Ambulatory Visit: Payer: Commercial Managed Care - HMO

## 2016-04-07 DIAGNOSIS — Z510 Encounter for antineoplastic radiation therapy: Secondary | ICD-10-CM | POA: Diagnosis not present

## 2016-04-08 ENCOUNTER — Encounter: Payer: Self-pay | Admitting: Radiation Oncology

## 2016-04-08 ENCOUNTER — Ambulatory Visit
Admission: RE | Admit: 2016-04-08 | Discharge: 2016-04-08 | Disposition: A | Discharge status: Home / Self Care | Payer: Commercial Managed Care - HMO | Source: Ambulatory Visit | Attending: Radiation Oncology | Admitting: Radiation Oncology

## 2016-04-08 ENCOUNTER — Ambulatory Visit: Payer: Commercial Managed Care - HMO

## 2016-04-08 VITALS — BP 136/89 | HR 78 | Resp 16 | Wt 195.5 lb

## 2016-04-08 DIAGNOSIS — C61 Malignant neoplasm of prostate: Secondary | ICD-10-CM

## 2016-04-08 DIAGNOSIS — Z510 Encounter for antineoplastic radiation therapy: Secondary | ICD-10-CM | POA: Diagnosis not present

## 2016-04-08 NOTE — Progress Notes (Signed)
Weight and vitals stable. Denies pain. Reports nocturia x 2. Denies dysuria or hematuria. Reports urinary urgency. Reports occasional difficulty emptying his bladder. Reports daily urinary leakage. Denies diarrhea. Denies fatigue.  BP 136/89 mmHg  Pulse 78  Resp 16  Wt 195 lb 8 oz (88.678 kg)  SpO2 100% Wt Readings from Last 3 Encounters:  04/08/16 195 lb 8 oz (88.678 kg)  04/01/16 195 lb 8 oz (88.678 kg)  03/25/16 193 lb 6.4 oz (87.726 kg)

## 2016-04-08 NOTE — Progress Notes (Signed)
   Department of Radiation Oncology  Phone:  [phone] Fax:        [phone]  Weekly Treatment Note    Name: Paul Murillo Date: 04/09/2016 MRN: [id number] DOB: July 05, 1956   Diagnosis:     ICD-9-CM ICD-10-CM   1. Malignant neoplasm of prostate ([place]) 185 C61      Current dose: 25.35 Gy  Current fraction:13   MEDICATIONS: Current Outpatient Prescriptions  Medication Sig Dispense Refill  . aspirin 81 MG EC tablet Take 81 mg by mouth daily.      Marland Kitchen atorvastatin (LIPITOR) 80 MG tablet TAKE 1 TABLET BY MOUTH EVERY DAY 90 tablet 1  . losartan-hydrochlorothiazide (HYZAAR) 100-12.5 MG tablet TAKE 1 TABLET BY MOUTH BY MOUTH DAILY. 90 tablet 0  . nitroGLYCERIN (NITROSTAT) 0.4 MG SL tablet Place 0.4 mg under the tongue every 5 (five) minutes as needed for chest pain. Reported on 04/01/2016    . pantoprazole (PROTONIX) 40 MG tablet Take 1 tablet (40 mg total) by mouth daily. 90 tablet 2  . prasugrel (EFFIENT) 10 MG TABS Take 10 mg by mouth daily. Reported on 01/18/2016    . vardenafil (LEVITRA) 20 MG tablet Take 20 mg by mouth.    . Vitamin D, Ergocalciferol, (DRISDOL) 50000 units CAPS capsule Take 1 capsule (50,000 Units total) by mouth every 7 (seven) days. 12 capsule 3   No current facility-administered medications for this encounter.     ALLERGIES: Review of patient's allergies indicates no known allergies.   LABORATORY DATA:  Lab Results  Component Value Date   WBC 6.6 02/25/2016   HGB 13.6 04/09/2014   HCT 39.0 02/25/2016   MCV 88 02/25/2016   PLT 221 02/25/2016   Lab Results  Component Value Date   NA 139 02/25/2016   K 3.9 02/25/2016   CL 95* 02/25/2016   CO2 24 02/25/2016   Lab Results  Component Value Date   ALT 26 02/25/2016   AST 33 02/25/2016   ALKPHOS 80 02/25/2016   BILITOT 0.4 02/25/2016     NARRATIVE: Paul Murillo was seen today for weekly treatment management. The chart was checked and the patient's films were reviewed.  Denies pain, dysuria,  hematuria, diarrhea, or fatigue. Reports nocturia x 2. Reports urinary urgency. Reports occasional difficulty emptying his bladder. Reports daily urinary leakage.  PHYSICAL EXAMINATION: weight is 195 lb 8 oz (88.678 kg). His blood pressure is 136/89 and his pulse is 78. His respiration is 16 and oxygen saturation is 100%.   ASSESSMENT: The patient is doing satisfactorily with treatment.  PLAN: We will continue with the patient's radiation treatment as planned.  ------------------------------------------------  Jodelle Gross, MD, PhD  This document serves as a record of services personally performed by Kyung Rudd, MD. It was created on his behalf by Darcus Austin, a trained medical scribe. The creation of this record is based on the scribe's personal observations and the provider's statements to them. This document has been checked and approved by the attending provider.

## 2016-04-11 ENCOUNTER — Ambulatory Visit
Admission: RE | Admit: 2016-04-11 | Discharge: 2016-04-11 | Disposition: A | Discharge status: Home / Self Care | Payer: Commercial Managed Care - HMO | Source: Ambulatory Visit | Attending: Radiation Oncology | Admitting: Radiation Oncology

## 2016-04-11 ENCOUNTER — Ambulatory Visit: Payer: Commercial Managed Care - HMO

## 2016-04-11 DIAGNOSIS — Z510 Encounter for antineoplastic radiation therapy: Secondary | ICD-10-CM | POA: Diagnosis not present

## 2016-04-12 ENCOUNTER — Ambulatory Visit: Payer: Commercial Managed Care - HMO

## 2016-04-12 ENCOUNTER — Ambulatory Visit
Admission: RE | Admit: 2016-04-12 | Discharge: 2016-04-12 | Disposition: A | Discharge status: Home / Self Care | Payer: Commercial Managed Care - HMO | Source: Ambulatory Visit | Attending: Radiation Oncology | Admitting: Radiation Oncology

## 2016-04-12 DIAGNOSIS — Z510 Encounter for antineoplastic radiation therapy: Secondary | ICD-10-CM | POA: Diagnosis not present

## 2016-04-13 ENCOUNTER — Ambulatory Visit: Payer: Commercial Managed Care - HMO

## 2016-04-13 ENCOUNTER — Ambulatory Visit
Admission: RE | Admit: 2016-04-13 | Discharge: 2016-04-13 | Disposition: A | Discharge status: Home / Self Care | Payer: Commercial Managed Care - HMO | Source: Ambulatory Visit | Attending: Radiation Oncology | Admitting: Radiation Oncology

## 2016-04-13 DIAGNOSIS — Z510 Encounter for antineoplastic radiation therapy: Secondary | ICD-10-CM | POA: Diagnosis not present

## 2016-04-14 ENCOUNTER — Ambulatory Visit
Admission: RE | Admit: 2016-04-14 | Discharge: 2016-04-14 | Disposition: A | Discharge status: Home / Self Care | Payer: Commercial Managed Care - HMO | Source: Ambulatory Visit | Attending: Radiation Oncology | Admitting: Radiation Oncology

## 2016-04-14 ENCOUNTER — Ambulatory Visit: Payer: Commercial Managed Care - HMO

## 2016-04-14 DIAGNOSIS — Z510 Encounter for antineoplastic radiation therapy: Secondary | ICD-10-CM | POA: Diagnosis not present

## 2016-04-15 ENCOUNTER — Ambulatory Visit
Admission: RE | Admit: 2016-04-15 | Discharge: 2016-04-15 | Disposition: A | Discharge status: Home / Self Care | Payer: Commercial Managed Care - HMO | Source: Ambulatory Visit | Attending: Radiation Oncology | Admitting: Radiation Oncology

## 2016-04-15 ENCOUNTER — Encounter: Payer: Self-pay | Admitting: Radiation Oncology

## 2016-04-15 ENCOUNTER — Ambulatory Visit: Payer: Commercial Managed Care - HMO

## 2016-04-15 VITALS — BP 147/92 | HR 91 | Temp 98.6°F | Resp 18 | Wt 195.7 lb

## 2016-04-15 DIAGNOSIS — Z510 Encounter for antineoplastic radiation therapy: Secondary | ICD-10-CM | POA: Diagnosis not present

## 2016-04-15 DIAGNOSIS — C61 Malignant neoplasm of prostate: Secondary | ICD-10-CM

## 2016-04-15 NOTE — Progress Notes (Signed)
  Radiation Oncology         [phone]   Name: Paul Murillo MRN: [id number]   Date: 04/15/2016  DOB: [birth year]   Weekly Radiation Therapy Management    ICD-9-CM ICD-10-CM   1. Malignant neoplasm of prostate ([place]) 185 C61     Current Dose: 35.1 Gy  Planned Dose:  78 Gy  Narrative The patient presents for routine under treatment assessment. Reports pain in his throat as a 8/10. Reports productive cough, nasal drainage, and congestion in his ears since Saturday. Explains that Saturday he ran a fever and had chills. Afebrile today. Report one episode of hematuria this week. Reports nocturia x 2. Reports urinary urgency. Reports occasional difficulty emptying his bladder. Reports daily urinary leakage. Denies diarrhea or dysuria. Reports blood in his stool either last Monday or Tuesday. Set-up films were reviewed. The chart was checked.  Physical Findings  weight is 195 lb 11.2 oz (88.769 kg). His oral temperature is 98.6 F (37 C). His blood pressure is 147/92 and his pulse is 91. His respiration is 18 and oxygen saturation is 100%. . Weight essentially stable.  No significant changes.  Impression The patient is tolerating radiation.  Plan Continue treatment as planned. I advised the patient to red this weekend.   Sheral Apley Tammi Klippel, M.D.  This document serves as a record of services personally performed by Tyler Pita, MD. It was created on his behalf by Darcus Austin, a trained medical scribe. The creation of this record is based on the scribe's personal observations and the provider's statements to them. This document has been checked and approved by the attending provider.

## 2016-04-15 NOTE — Progress Notes (Signed)
Weight and vitals stable. Reports pain in his throat 8 on a scale of 0-10. Reports productive cough, nasal drainage and congestion in ears since Saturday. Explains that Saturday he ran a fever and had chills. Afebrile today. Report one episode of hematuria this week. Reports nocturia x 2. Denies dysuria. Reports urinary urgency. Reports occasional difficulty emptying his bladder. Reports daily urinary leakage. Denies diarrhea.   BP 147/92 mmHg  Pulse 91  Temp(Src) 98.6 F (37 C) (Oral)  Resp 18  Wt 195 lb 11.2 oz (88.769 kg)  SpO2 100% Wt Readings from Last 3 Encounters:  04/15/16 195 lb 11.2 oz (88.769 kg)  04/08/16 195 lb 8 oz (88.678 kg)  04/01/16 195 lb 8 oz (88.678 kg)

## 2016-04-17 DIAGNOSIS — C61 Malignant neoplasm of prostate: Secondary | ICD-10-CM | POA: Insufficient documentation

## 2016-04-17 DIAGNOSIS — Z510 Encounter for antineoplastic radiation therapy: Secondary | ICD-10-CM | POA: Insufficient documentation

## 2016-04-18 ENCOUNTER — Ambulatory Visit: Payer: Commercial Managed Care - HMO

## 2016-04-18 ENCOUNTER — Ambulatory Visit
Admission: RE | Admit: 2016-04-18 | Discharge: 2016-04-18 | Disposition: A | Discharge status: Home / Self Care | Payer: Commercial Managed Care - HMO | Source: Ambulatory Visit | Attending: Radiation Oncology | Admitting: Radiation Oncology

## 2016-04-18 DIAGNOSIS — Z510 Encounter for antineoplastic radiation therapy: Secondary | ICD-10-CM | POA: Diagnosis present

## 2016-04-18 DIAGNOSIS — C61 Malignant neoplasm of prostate: Secondary | ICD-10-CM | POA: Diagnosis present

## 2016-04-19 ENCOUNTER — Ambulatory Visit: Payer: Commercial Managed Care - HMO

## 2016-04-19 ENCOUNTER — Ambulatory Visit
Admission: RE | Admit: 2016-04-19 | Discharge: 2016-04-19 | Disposition: A | Discharge status: Home / Self Care | Payer: Commercial Managed Care - HMO | Source: Ambulatory Visit | Attending: Radiation Oncology | Admitting: Radiation Oncology

## 2016-04-19 DIAGNOSIS — Z510 Encounter for antineoplastic radiation therapy: Secondary | ICD-10-CM | POA: Diagnosis not present

## 2016-04-20 ENCOUNTER — Ambulatory Visit
Admission: RE | Admit: 2016-04-20 | Discharge: 2016-04-20 | Disposition: A | Discharge status: Home / Self Care | Payer: Commercial Managed Care - HMO | Source: Ambulatory Visit | Attending: Radiation Oncology | Admitting: Radiation Oncology

## 2016-04-20 ENCOUNTER — Ambulatory Visit: Payer: Commercial Managed Care - HMO

## 2016-04-20 DIAGNOSIS — Z510 Encounter for antineoplastic radiation therapy: Secondary | ICD-10-CM | POA: Diagnosis not present

## 2016-04-21 ENCOUNTER — Ambulatory Visit
Admission: RE | Admit: 2016-04-21 | Discharge: 2016-04-21 | Disposition: A | Discharge status: Home / Self Care | Payer: Commercial Managed Care - HMO | Source: Ambulatory Visit | Attending: Radiation Oncology | Admitting: Radiation Oncology

## 2016-04-21 ENCOUNTER — Ambulatory Visit: Payer: Commercial Managed Care - HMO

## 2016-04-21 VITALS — BP 152/89 | HR 98 | Resp 16 | Wt 194.0 lb

## 2016-04-21 DIAGNOSIS — C61 Malignant neoplasm of prostate: Secondary | ICD-10-CM

## 2016-04-21 DIAGNOSIS — Z510 Encounter for antineoplastic radiation therapy: Secondary | ICD-10-CM | POA: Diagnosis not present

## 2016-04-21 NOTE — Progress Notes (Signed)
Report one episode of hematuria this week. Reports sore throat and congestion related to sinus infection are almost completely resolved.Reports nocturia x 2. Denies dysuria or hematuria. Reports urinary urgency. Reports occasional difficulty emptying his bladder. Reports daily urinary leakage. Denies diarrhea but, does report one episode where he saw blood in the stool.  BP 152/89 mmHg  Pulse 98  Resp 16  Wt 194 lb (87.998 kg)  SpO2 100% Wt Readings from Last 3 Encounters:  04/21/16 194 lb (87.998 kg)  04/15/16 195 lb 11.2 oz (88.769 kg)  04/08/16 195 lb 8 oz (88.678 kg)

## 2016-04-21 NOTE — Progress Notes (Signed)
  Radiation Oncology         [phone]   Name: Paul Murillo MRN: [id number]   Date: 04/21/2016  DOB: 08-22-1956   Weekly Radiation Therapy Management  C61 Malignant neoplasm of prostate   Current Dose: 42.9 Gy  Planned Dose:  78 Gy  Narrative The patient presents for routine under treatment assessment. Reports pain in his throat as a 8/10. Reports productive cough, nasal drainage, and congestion in his ears since Saturday. Explains that Saturday he ran a fever and had chills. Afebrile today. Report one episode of hematuria this week. Reports nocturia x 2. Reports urinary urgency. Reports occasional difficulty emptying his bladder. Reports daily urinary leakage. Denies diarrhea or dysuria. Reports blood in his stool either last Monday or Tuesday. Set-up films were reviewed. The chart was checked.  Physical Findings  weight is 194 lb (87.998 kg). His blood pressure is 152/89 and his pulse is 98. His respiration is 16 and oxygen saturation is 100%. . Weight essentially stable.  No significant changes.  Impression The patient is tolerating radiation.  Plan Continue treatment as planned. I advised the patient to red this weekend.   Sheral Apley Tammi Klippel, M.D.  This document serves as a record of services personally performed by Tyler Pita, MD. It was created on his behalf by Truddie Hidden, a trained medical scribe. The creation of this record is based on the scribe's personal observations and the provider's statements to them. This document has been checked and approved by the attending provider.

## 2016-04-22 ENCOUNTER — Ambulatory Visit
Admission: RE | Admit: 2016-04-22 | Discharge: 2016-04-22 | Disposition: A | Discharge status: Home / Self Care | Payer: Commercial Managed Care - HMO | Source: Ambulatory Visit | Attending: Radiation Oncology | Admitting: Radiation Oncology

## 2016-04-22 ENCOUNTER — Ambulatory Visit: Payer: Commercial Managed Care - HMO

## 2016-04-22 DIAGNOSIS — Z510 Encounter for antineoplastic radiation therapy: Secondary | ICD-10-CM | POA: Diagnosis not present

## 2016-04-25 ENCOUNTER — Ambulatory Visit: Payer: Commercial Managed Care - HMO

## 2016-04-25 ENCOUNTER — Ambulatory Visit
Admission: RE | Admit: 2016-04-25 | Discharge: 2016-04-25 | Disposition: A | Discharge status: Home / Self Care | Payer: Commercial Managed Care - HMO | Source: Ambulatory Visit | Attending: Radiation Oncology | Admitting: Radiation Oncology

## 2016-04-25 DIAGNOSIS — Z510 Encounter for antineoplastic radiation therapy: Secondary | ICD-10-CM | POA: Diagnosis not present

## 2016-04-27 ENCOUNTER — Ambulatory Visit
Admission: RE | Admit: 2016-04-27 | Discharge: 2016-04-27 | Disposition: A | Discharge status: Home / Self Care | Payer: Commercial Managed Care - HMO | Source: Ambulatory Visit | Attending: Radiation Oncology | Admitting: Radiation Oncology

## 2016-04-27 DIAGNOSIS — Z510 Encounter for antineoplastic radiation therapy: Secondary | ICD-10-CM | POA: Diagnosis not present

## 2016-04-28 ENCOUNTER — Ambulatory Visit
Admission: RE | Admit: 2016-04-28 | Discharge: 2016-04-28 | Disposition: A | Discharge status: Home / Self Care | Payer: Commercial Managed Care - HMO | Source: Ambulatory Visit | Attending: Radiation Oncology | Admitting: Radiation Oncology

## 2016-04-28 DIAGNOSIS — Z510 Encounter for antineoplastic radiation therapy: Secondary | ICD-10-CM | POA: Diagnosis not present

## 2016-04-29 ENCOUNTER — Ambulatory Visit
Admission: RE | Admit: 2016-04-29 | Discharge: 2016-04-29 | Disposition: A | Discharge status: Home / Self Care | Payer: Commercial Managed Care - HMO | Source: Ambulatory Visit | Attending: Radiation Oncology | Admitting: Radiation Oncology

## 2016-04-29 VITALS — BP 125/83 | HR 87 | Resp 16 | Wt 192.4 lb

## 2016-04-29 DIAGNOSIS — Z510 Encounter for antineoplastic radiation therapy: Secondary | ICD-10-CM | POA: Diagnosis not present

## 2016-04-29 DIAGNOSIS — C61 Malignant neoplasm of prostate: Secondary | ICD-10-CM

## 2016-04-29 NOTE — Progress Notes (Signed)
Weight and vitals stable. Denies pain. Reports nocturia x 3. Reports urinary frequency every thirty minutes during the day time. Denies dysuria or hematuria. Reports urinary urgency is worse. Reports difficulty emptying his bladder completely. Reports daily urinary leakage. Reports soft stool and one episode of blood in stool this week.  BP 125/83 mmHg  Pulse 87  Resp 16  Wt 192 lb 6.4 oz (87.272 kg)  SpO2 100% Wt Readings from Last 3 Encounters:  04/29/16 192 lb 6.4 oz (87.272 kg)  04/21/16 194 lb (87.998 kg)  04/15/16 195 lb 11.2 oz (88.769 kg)

## 2016-04-29 NOTE — Progress Notes (Signed)
Department of Radiation Oncology  Phone:  [phone] Fax:        [phone]  Weekly Treatment Note    Name: Paul Murillo Date: 04/29/2016 MRN: [id number] DOB: 11-Aug-1956   Diagnosis:  No diagnosis found.   Current dose: 52.65 Gy  Current fraction: 27   MEDICATIONS: Current Outpatient Prescriptions  Medication Sig Dispense Refill  . aspirin 81 MG EC tablet Take 81 mg by mouth daily.      Marland Kitchen atorvastatin (LIPITOR) 80 MG tablet TAKE 1 TABLET BY MOUTH EVERY DAY 90 tablet 1  . losartan-hydrochlorothiazide (HYZAAR) 100-12.5 MG tablet TAKE 1 TABLET BY MOUTH BY MOUTH DAILY. 90 tablet 0  . nitroGLYCERIN (NITROSTAT) 0.4 MG SL tablet Place 0.4 mg under the tongue every 5 (five) minutes as needed for chest pain. Reported on 04/01/2016    . pantoprazole (PROTONIX) 40 MG tablet Take 1 tablet (40 mg total) by mouth daily. 90 tablet 2  . prasugrel (EFFIENT) 10 MG TABS Take 10 mg by mouth daily. Reported on 01/18/2016    . vardenafil (LEVITRA) 20 MG tablet Take 20 mg by mouth.    . Vitamin D, Ergocalciferol, (DRISDOL) 50000 units CAPS capsule Take 1 capsule (50,000 Units total) by mouth every 7 (seven) days. 12 capsule 3   No current facility-administered medications for this encounter.     ALLERGIES: Review of patient's allergies indicates no known allergies.   LABORATORY DATA:  Lab Results  Component Value Date   WBC 6.6 02/25/2016   HGB 13.6 04/09/2014   HCT 39.0 02/25/2016   MCV 88 02/25/2016   PLT 221 02/25/2016   Lab Results  Component Value Date   NA 139 02/25/2016   K 3.9 02/25/2016   CL 95* 02/25/2016   CO2 24 02/25/2016   Lab Results  Component Value Date   ALT 26 02/25/2016   AST 33 02/25/2016   ALKPHOS 80 02/25/2016   BILITOT 0.4 02/25/2016     NARRATIVE: Paul Murillo was seen today for weekly treatment management. The chart was checked and the patient's films were reviewed.  Weight and vitals stable. Denies pain. Reports nocturia x 3. Reports urinary  frequency every thirty minutes during the day time. Denies dysuria or hematuria. Reports urinary urgency is worse. Reports difficulty emptying his bladder completely. Reports daily urinary leakage. Reports soft stool and one episode of blood in stool this week. This is the third time he has noticed bright red blood in stool, on toilet paper, and in toilet water. He reports blood in stool typically happens on Tuesdays, post first treatment after the weekend break. He denies any pain with bowel movement or after. His last colonoscopy was performed when he was 60 years of age. He does not have another colonoscopy scheduled. He denies constipation or excess straining with bowel movements. He has previously taken Flomax prescribed by Dr. Gaynelle Arabian and did not like it as it caused premature ejaculation. When he previously took Flomax, it did not help with urinary symptoms.    PHYSICAL EXAMINATION: weight is 192 lb 6.4 oz (87.272 kg). His blood pressure is 125/83 and his pulse is 87. His respiration is 16 and oxygen saturation is 100%.      Alert. In no acute distress.  ASSESSMENT: The patient is doing satisfactorily with treatment.  PLAN: We will continue with the patient's radiation treatment as planned.     ------------------------------------------------  Jodelle Gross, MD, PhD  This document serves as a record of services personally performed by Jenny Reichmann  Lisbeth Renshaw, MD and Shona Simpson, PA-C. It was created on his behalf by Arlyce Harman, a trained medical scribe. The creation of this record is based on the scribe's personal observations and the provider's statements to them. This document has been checked and approved by the attending provider.

## 2016-05-02 ENCOUNTER — Ambulatory Visit
Admission: RE | Admit: 2016-05-02 | Discharge: 2016-05-02 | Disposition: A | Discharge status: Home / Self Care | Payer: Commercial Managed Care - HMO | Source: Ambulatory Visit | Attending: Radiation Oncology | Admitting: Radiation Oncology

## 2016-05-02 DIAGNOSIS — Z510 Encounter for antineoplastic radiation therapy: Secondary | ICD-10-CM | POA: Diagnosis not present

## 2016-05-03 ENCOUNTER — Ambulatory Visit
Admission: RE | Admit: 2016-05-03 | Discharge: 2016-05-03 | Disposition: A | Discharge status: Home / Self Care | Payer: Commercial Managed Care - HMO | Source: Ambulatory Visit | Attending: Radiation Oncology | Admitting: Radiation Oncology

## 2016-05-03 DIAGNOSIS — Z510 Encounter for antineoplastic radiation therapy: Secondary | ICD-10-CM | POA: Diagnosis not present

## 2016-05-04 ENCOUNTER — Ambulatory Visit
Admission: RE | Admit: 2016-05-04 | Discharge: 2016-05-04 | Disposition: A | Discharge status: Home / Self Care | Payer: Commercial Managed Care - HMO | Source: Ambulatory Visit | Attending: Radiation Oncology | Admitting: Radiation Oncology

## 2016-05-04 DIAGNOSIS — Z510 Encounter for antineoplastic radiation therapy: Secondary | ICD-10-CM | POA: Diagnosis not present

## 2016-05-05 ENCOUNTER — Ambulatory Visit
Admission: RE | Admit: 2016-05-05 | Discharge: 2016-05-05 | Disposition: A | Discharge status: Home / Self Care | Payer: Commercial Managed Care - HMO | Source: Ambulatory Visit | Attending: Radiation Oncology | Admitting: Radiation Oncology

## 2016-05-05 DIAGNOSIS — Z510 Encounter for antineoplastic radiation therapy: Secondary | ICD-10-CM | POA: Diagnosis not present

## 2016-05-06 ENCOUNTER — Ambulatory Visit
Admission: RE | Admit: 2016-05-06 | Discharge: 2016-05-06 | Disposition: A | Discharge status: Home / Self Care | Payer: Commercial Managed Care - HMO | Source: Ambulatory Visit | Attending: Radiation Oncology | Admitting: Radiation Oncology

## 2016-05-06 ENCOUNTER — Encounter: Payer: Self-pay | Admitting: Radiation Oncology

## 2016-05-06 VITALS — BP 134/81 | HR 85 | Resp 16 | Wt 190.2 lb

## 2016-05-06 DIAGNOSIS — C61 Malignant neoplasm of prostate: Secondary | ICD-10-CM

## 2016-05-06 DIAGNOSIS — Z510 Encounter for antineoplastic radiation therapy: Secondary | ICD-10-CM | POA: Diagnosis not present

## 2016-05-06 NOTE — Progress Notes (Signed)
Weight and vitals stable. Denies pain. Reports nocturia x 3. Reports urinary frequency every thirty minutes during the day time. Denies dysuria or hematuria. Reports urinary urgency is worse. Reports difficulty emptying his bladder completely. Reports daily urinary leakage. Reports soft stools without blood.   BP 134/81 mmHg  Pulse 85  Resp 16  Wt 190 lb 3.2 oz (86.274 kg)  SpO2 100% Wt Readings from Last 3 Encounters:  05/06/16 190 lb 3.2 oz (86.274 kg)  04/29/16 192 lb 6.4 oz (87.272 kg)  04/21/16 194 lb (87.998 kg)

## 2016-05-06 NOTE — Progress Notes (Signed)
  Radiation Oncology         [phone]   Name: Paul Murillo MRN: [id number]   Date: 05/06/2016  DOB: 1956-01-25     Weekly Radiation Therapy Management    ICD-9-CM ICD-10-CM   1. Malignant neoplasm of prostate ([place]) 185 C61     Current Dose: 62.4 Gy  Planned Dose:  78 Gy  Narrative The patient presents for routine under treatment assessment.  Weight and vitals stable. Denies pain. Reports nocturia x 3. Reports urinary frequency every thirty minutes during the day time. Denies dysuria or hematuria. Reports urinary urgency is worse. Reports difficulty emptying his bladder completely. Reports daily urinary leakage. Reports soft stools without blood.   Set-up films were reviewed. The chart was checked.  Physical Findings  weight is 190 lb 3.2 oz (86.274 kg). His blood pressure is 134/81 and his pulse is 85. His respiration is 16 and oxygen saturation is 100%. . Weight essentially stable.  No significant changes. Alert and in no acute distress.   Impression The patient is tolerating radiation.  Plan Continue treatment as planned.         Sheral Apley Tammi Klippel, M.D.  This document serves as a record of services personally performed by Tyler Pita, MD. It was created on his behalf by Arlyce Harman, a trained medical scribe. The creation of this record is based on the scribe's personal observations and the provider's statements to them. This document has been checked and approved by the attending provider.

## 2016-05-09 ENCOUNTER — Ambulatory Visit
Admission: RE | Admit: 2016-05-09 | Discharge: 2016-05-09 | Disposition: A | Discharge status: Home / Self Care | Payer: Commercial Managed Care - HMO | Source: Ambulatory Visit | Attending: Radiation Oncology | Admitting: Radiation Oncology

## 2016-05-09 DIAGNOSIS — Z510 Encounter for antineoplastic radiation therapy: Secondary | ICD-10-CM | POA: Diagnosis not present

## 2016-05-10 ENCOUNTER — Ambulatory Visit
Admission: RE | Admit: 2016-05-10 | Discharge: 2016-05-10 | Disposition: A | Discharge status: Home / Self Care | Payer: Commercial Managed Care - HMO | Source: Ambulatory Visit | Attending: Radiation Oncology | Admitting: Radiation Oncology

## 2016-05-10 DIAGNOSIS — Z510 Encounter for antineoplastic radiation therapy: Secondary | ICD-10-CM | POA: Diagnosis not present

## 2016-05-11 ENCOUNTER — Ambulatory Visit
Admission: RE | Admit: 2016-05-11 | Discharge: 2016-05-11 | Disposition: A | Discharge status: Home / Self Care | Payer: Commercial Managed Care - HMO | Source: Ambulatory Visit | Attending: Radiation Oncology | Admitting: Radiation Oncology

## 2016-05-11 DIAGNOSIS — Z510 Encounter for antineoplastic radiation therapy: Secondary | ICD-10-CM | POA: Diagnosis not present

## 2016-05-12 ENCOUNTER — Ambulatory Visit
Admission: RE | Admit: 2016-05-12 | Discharge: 2016-05-12 | Disposition: A | Discharge status: Home / Self Care | Payer: Commercial Managed Care - HMO | Source: Ambulatory Visit | Attending: Radiation Oncology | Admitting: Radiation Oncology

## 2016-05-12 ENCOUNTER — Encounter: Payer: Self-pay | Admitting: Radiation Oncology

## 2016-05-12 VITALS — BP 131/92 | HR 80 | Resp 16 | Wt 193.4 lb

## 2016-05-12 DIAGNOSIS — Z510 Encounter for antineoplastic radiation therapy: Secondary | ICD-10-CM | POA: Diagnosis not present

## 2016-05-12 DIAGNOSIS — C61 Malignant neoplasm of prostate: Secondary | ICD-10-CM

## 2016-05-12 NOTE — Progress Notes (Signed)
  Radiation Oncology         [phone]   Name: Paul Murillo MRN: [id number]   Date: 05/12/2016  DOB: Mar 18, 1956     Weekly Radiation Therapy Management    ICD-9-CM ICD-10-CM   1. Malignant neoplasm of prostate ([place]) 185 C61     Current Dose: Reviewed Planned Dose:  78 Gy  Narrative The patient presents for routine under treatment assessment.  Weight and vitals stable. Denies pain. Reports nocturia x 3. Reports urinary frequency every thirty minutes during the day time. Denies hematuria. Reports a mild new onset of dysuria. Reports urinary urgency continues. Reports difficulty emptying his bladder completely. Reports daily urinary leakage. Reports soft stools without blood and some rectal irritation. Twin grandchildren born today.    Set-up films were reviewed. The chart was checked.  Physical Findings  weight is 193 lb 6.4 oz (87.7 kg). His blood pressure is 131/92 (abnormal) and his pulse is 80. His respiration is 16 and oxygen saturation is 100%. . Weight essentially stable.  No significant changes. Alert and in no acute distress.   Impression The patient is tolerating radiation.  Plan Continue treatment as planned. The patient completes treatment next Wednesday and will follow up in 1 month after that final treatment. A 1 month follow up card was given by the nurse.  The patient will continue to follow with his PCP regarding elevated blood pressure.    Sheral Apley Tammi Klippel, M.D.  This document serves as a record of services personally performed by Tyler Pita, MD. It was created on his behalf by Darcus Austin, a trained medical scribe. The creation of this record is based on the scribe's personal observations and the provider's statements to them. This document has been checked and approved by the attending provider.

## 2016-05-12 NOTE — Progress Notes (Signed)
Weight and vitals stable. Denies pain. Reports nocturia x 3. Reports urinary frequency every thirty minutes during the day time. Denies hematuria. Reports mild new onset of dysuria. Reports urinary urgency continues. Reports difficulty emptying his bladder completely. Reports daily urinary leakage. Reports soft stools without blood. One month follow up appointment card given. Twin grandchildren born today.   BP 131/92 mmHg  Pulse 80  Resp 16  Wt 193 lb 6.4 oz (87.726 kg)  SpO2 100% Wt Readings from Last 3 Encounters:  05/12/16 193 lb 6.4 oz (87.726 kg)  05/06/16 190 lb 3.2 oz (86.274 kg)  04/29/16 192 lb 6.4 oz (87.272 kg)

## 2016-05-13 ENCOUNTER — Ambulatory Visit
Admission: RE | Admit: 2016-05-13 | Discharge: 2016-05-13 | Disposition: A | Discharge status: Home / Self Care | Payer: Commercial Managed Care - HMO | Source: Ambulatory Visit | Attending: Radiation Oncology | Admitting: Radiation Oncology

## 2016-05-13 DIAGNOSIS — Z510 Encounter for antineoplastic radiation therapy: Secondary | ICD-10-CM | POA: Diagnosis not present

## 2016-05-16 ENCOUNTER — Ambulatory Visit
Admission: RE | Admit: 2016-05-16 | Discharge: 2016-05-16 | Disposition: A | Discharge status: Home / Self Care | Payer: Commercial Managed Care - HMO | Source: Ambulatory Visit | Attending: Radiation Oncology | Admitting: Radiation Oncology

## 2016-05-16 DIAGNOSIS — Z510 Encounter for antineoplastic radiation therapy: Secondary | ICD-10-CM | POA: Diagnosis not present

## 2016-05-17 ENCOUNTER — Ambulatory Visit
Admission: RE | Admit: 2016-05-17 | Discharge: 2016-05-17 | Disposition: A | Discharge status: Home / Self Care | Payer: Commercial Managed Care - HMO | Source: Ambulatory Visit | Attending: Radiation Oncology | Admitting: Radiation Oncology

## 2016-05-17 DIAGNOSIS — Z510 Encounter for antineoplastic radiation therapy: Secondary | ICD-10-CM | POA: Diagnosis not present

## 2016-05-18 ENCOUNTER — Encounter: Payer: Self-pay | Admitting: Radiation Oncology

## 2016-05-18 ENCOUNTER — Ambulatory Visit
Admission: RE | Admit: 2016-05-18 | Discharge: 2016-05-18 | Disposition: A | Discharge status: Home / Self Care | Payer: Commercial Managed Care - HMO | Source: Ambulatory Visit | Attending: Radiation Oncology | Admitting: Radiation Oncology

## 2016-05-18 DIAGNOSIS — Z510 Encounter for antineoplastic radiation therapy: Secondary | ICD-10-CM | POA: Diagnosis not present

## 2016-05-23 NOTE — Progress Notes (Signed)
  Radiation Oncology         [phone]) [phone] ________________________________  Name: Paul Murillo MRN: [id number]  Date: 05/18/2016  DOB: December 14, 1955  End of Treatment Note  Diagnosis: 60 y.o. gentleman with stage T1c adenocarcinoma of the prostate with a Gleason's score of 3+4 and a PSA of 4.58  Indication for treatment:  Curative, Definitive Radiotherapy       Radiation treatment dates: 03/23/16 - 05/18/16  Site/dose:   The prostate was treated to 78 Gy in 40 fractions of 1.95 Gy  Beams/energy:   The patient was treated with IMRT using 9 static fields delivering 10 MV X-rays oriented circumferentially around the patient.  Image guidance was performed with daily cone beam CT prior to each fraction to align to gold markers in the prostate and assure proper bladder and rectal fill volumes.  Immobilization was achieved with Body[employer] custom mold.  Narrative: The patient tolerated radiation treatment relatively well. He reported nocturia x 3, urinary frequency every thirty minutes during the day, dysuria towards the end of treatment, urinary urgency, difficulty emptying his bladder completely, urinary leakage, and soft stools. Denies hematuria.  Plan: The patient has completed radiation treatment. He will return to radiation oncology clinic for routine followup in one month. I advised him to call or return sooner if he has any questions or concerns related to his recovery or treatment. ________________________________  Sheral Apley. Tammi Klippel, M.D.  This document serves as a record of services personally performed by Tyler Pita, MD. It was created on his behalf by Darcus Austin, a trained medical scribe. The creation of this record is based on the scribe's personal observations and the provider's statements to them. This document has been checked and approved by the attending provider.

## 2016-05-29 ENCOUNTER — Other Ambulatory Visit: Payer: Self-pay | Admitting: Family

## 2016-05-29 DIAGNOSIS — K219 Gastro-esophageal reflux disease without esophagitis: Secondary | ICD-10-CM

## 2016-06-21 ENCOUNTER — Ambulatory Visit: Payer: Self-pay | Admitting: Radiation Oncology

## 2016-06-24 ENCOUNTER — Other Ambulatory Visit: Payer: Self-pay | Admitting: Family

## 2016-07-06 ENCOUNTER — Encounter: Payer: Self-pay | Admitting: Radiation Oncology

## 2016-07-06 ENCOUNTER — Telehealth: Payer: Self-pay | Admitting: *Deleted

## 2016-07-06 ENCOUNTER — Ambulatory Visit
Admission: RE | Admit: 2016-07-06 | Discharge: 2016-07-06 | Disposition: A | Discharge status: Home / Self Care | Payer: Commercial Managed Care - HMO | Source: Ambulatory Visit | Attending: Radiation Oncology | Admitting: Radiation Oncology

## 2016-07-06 VITALS — BP 150/97 | HR 71 | Temp 97.8°F | Ht 73.0 in | Wt 187.4 lb

## 2016-07-06 DIAGNOSIS — C61 Malignant neoplasm of prostate: Secondary | ICD-10-CM

## 2016-07-06 DIAGNOSIS — Z7982 Long term (current) use of aspirin: Secondary | ICD-10-CM | POA: Diagnosis not present

## 2016-07-06 DIAGNOSIS — R300 Dysuria: Secondary | ICD-10-CM | POA: Insufficient documentation

## 2016-07-06 DIAGNOSIS — Z79899 Other long term (current) drug therapy: Secondary | ICD-10-CM | POA: Diagnosis not present

## 2016-07-06 LAB — URINALYSIS, MICROSCOPIC - CHCC
Bilirubin (Urine): NEGATIVE
GLUCOSE UR CHCC: NEGATIVE mg/dL
KETONES: NEGATIVE mg/dL
Nitrite: NEGATIVE
PH: 7 (ref 4.6–8.0)
PROTEIN: NEGATIVE mg/dL
SPECIFIC GRAVITY, URINE: 1.01 (ref 1.003–1.035)
Urobilinogen, UR: 0.2 mg/dL (ref 0.2–1)

## 2016-07-06 NOTE — Telephone Encounter (Signed)
Called and spoke to Paul Murillo's spouse since he was not present,to relay that he has a urinary tract infection and needs to pick up his script today for Bactrim DS #10, 1 po BID x 10 days : [facility], [place], [place], [address] Store # [phone]  She stated understanding.

## 2016-07-06 NOTE — Progress Notes (Signed)
Mr. Canty here for reassessement S/P XRT to his pelvis for prostate cancer.  He reports that every time he voids he has the urge to defecate -soft in nature. Frequent urination due to incomplete emptying of bladder with each void.  Nocturia x 3. He note that he is"getting a liitle odor" from urine. - no hematuria, nor darkening of color of urine when voiding.  No pain upon urination and denies proctitis

## 2016-07-06 NOTE — Progress Notes (Signed)
Radiation Oncology         [phone]) [phone] ________________________________  Name: Paul Murillo MRN: [id number]  Date: 07/06/2016  DOB: Nov 13, 1955  Post Treatment Note  CC: Paul Dun, FNP  Paul Clines, MD  Diagnosis:  60 y.o. gentleman with stage T1c adenocarcinoma of the prostate with a Gleason's score of 3+4 and a PSA of 4.58.  Interval Since Last Radiation:  7 weeks   03/23/16 - 05/18/16: The prostate was treated to 78 Gy in 40 fractions of 1.95 Gy  Narrative:  The patient returns today for routine follow-up. He tolerated radiotherapy well but did experience nocturia x3, frequency, and dysuria toward the end of his treatments.                              On review of systems, the patient states he is doing well. He continues to have nocturia x3. He has noticed malodorous urine and some dysuria in the last week. He denies flank pain or fever. He notices some urgency with bowel movements at times. No other complaints are verbalized.  ALLERGIES:  has No Known Allergies.  Meds: Current Outpatient Prescriptions  Medication Sig Dispense Refill  . aspirin 81 MG EC tablet Take 81 mg by mouth daily.      Marland Kitchen losartan-hydrochlorothiazide (HYZAAR) 100-12.5 MG tablet TAKE 1 TABLET BY MOUTH BY MOUTH DAILY. 90 tablet 0  . nitroGLYCERIN (NITROSTAT) 0.4 MG SL tablet Place 0.4 mg under the tongue every 5 (five) minutes as needed for chest pain. Reported on 04/01/2016    . pantoprazole (PROTONIX) 40 MG tablet TAKE 1 TABLET (40 MG TOTAL) BY MOUTH DAILY. 90 tablet 0  . prasugrel (EFFIENT) 10 MG TABS Take 10 mg by mouth daily. Reported on 01/18/2016    . vardenafil (LEVITRA) 20 MG tablet Take 20 mg by mouth.    . Vitamin D, Ergocalciferol, (DRISDOL) 50000 units CAPS capsule Take 1 capsule (50,000 Units total) by mouth every 7 (seven) days. 12 capsule 3  . atorvastatin (LIPITOR) 80 MG tablet TAKE 1 TABLET BY MOUTH EVERY DAY (Patient not taking: Reported on 07/06/2016) 90 tablet 0   No current  facility-administered medications for this encounter.     Physical Findings:  height is 6\' 1"  (1.854 m) and weight is 187 lb 6.4 oz (85 kg). His oral temperature is 97.8 F (36.6 C). His blood pressure is 150/97 (abnormal) and his pulse is 71.   In general this is a well appearing African American male in no acute distress. He's alert and oriented x4 and appropriate throughout the examination. Cardiopulmonary assessment is negative for acute distress and he exhibits normal effort. No CVA tenderness is noted bilaterally.  Lab Findings: Lab Results  Component Value Date   WBC 6.6 02/25/2016   HGB 13.6 04/09/2014   HCT 39.0 02/25/2016   MCV 88 02/25/2016   PLT 221 02/25/2016     Radiographic Findings: No results found.  Impression/Plan: 1. 60 y.o. Gentleman with Stage T1c adenocarcinoma of the prostate with a Gleason's score of 3+4 and a PSA of 4.58. The patient had done well since completing radiotherapy. He will plan to see urology to begin checking PSAs in the next 3 months or so. We will call Dr. Arlyn Murillo office to schedule this for him, and we'd be happy to see him back as needed moving forward. 2. Survivorship. I have outlined the rationale for survivorship and he is interested. I will place a  referral for him to see Paul Craze, NP. 3. Dysuria. We will send his urine for UA with C&S. I will follow up with results when available.      Carola Rhine, PAC

## 2016-07-07 ENCOUNTER — Telehealth: Payer: Self-pay | Admitting: *Deleted

## 2016-07-07 LAB — URINE CULTURE: Organism ID, Bacteria: NO GROWTH

## 2016-07-07 NOTE — Telephone Encounter (Signed)
[facility] called and has scheduled appointments for Paul Murillo as follows: Encounter for PSA 07/19/16 followed by a FU visit with Dr.Tannenbaum on 07/27/16.

## 2016-08-08 ENCOUNTER — Encounter: Payer: Commercial Managed Care - HMO | Admitting: Adult Health

## 2016-08-25 ENCOUNTER — Ambulatory Visit (INDEPENDENT_AMBULATORY_CARE_PROVIDER_SITE_OTHER): Payer: Commercial Managed Care - HMO | Admitting: Family

## 2016-08-25 ENCOUNTER — Encounter: Payer: Self-pay | Admitting: Family

## 2016-08-25 VITALS — BP 165/97 | HR 84 | Temp 97.5°F | Ht 73.0 in | Wt 186.4 lb

## 2016-08-25 DIAGNOSIS — E559 Vitamin D deficiency, unspecified: Secondary | ICD-10-CM | POA: Diagnosis not present

## 2016-08-25 DIAGNOSIS — F17200 Nicotine dependence, unspecified, uncomplicated: Secondary | ICD-10-CM | POA: Diagnosis not present

## 2016-08-25 DIAGNOSIS — Z23 Encounter for immunization: Secondary | ICD-10-CM | POA: Diagnosis not present

## 2016-08-25 DIAGNOSIS — C61 Malignant neoplasm of prostate: Secondary | ICD-10-CM

## 2016-08-25 DIAGNOSIS — E782 Mixed hyperlipidemia: Secondary | ICD-10-CM | POA: Diagnosis not present

## 2016-08-25 DIAGNOSIS — K219 Gastro-esophageal reflux disease without esophagitis: Secondary | ICD-10-CM

## 2016-08-25 DIAGNOSIS — I10 Essential (primary) hypertension: Secondary | ICD-10-CM | POA: Diagnosis not present

## 2016-08-25 MED ORDER — AMLODIPINE BESYLATE 5 MG PO TABS: 5.0000 mg | ORAL_TABLET | Freq: Every day | ORAL | 3 refills | Status: DC

## 2016-08-25 MED ORDER — ATORVASTATIN CALCIUM 40 MG PO TABS: 40.0000 mg | ORAL_TABLET | Freq: Every day | ORAL | 3 refills | Status: DC

## 2016-08-25 MED ORDER — PANTOPRAZOLE SODIUM 40 MG PO TBEC: 40.0000 mg | DELAYED_RELEASE_TABLET | Freq: Every day | ORAL | 2 refills | Status: DC

## 2016-08-25 MED ORDER — LOSARTAN POTASSIUM-HCTZ 100-12.5 MG PO TABS: ORAL_TABLET | ORAL | 2 refills | Status: DC

## 2016-08-25 MED ORDER — VITAMIN D (ERGOCALCIFEROL) 1.25 MG (50000 UNIT) PO CAPS: 50000.0000 [IU] | ORAL_CAPSULE | ORAL | 3 refills | Status: DC

## 2016-08-25 NOTE — Patient Instructions (Signed)
DASH Eating Plan  DASH stands for "Dietary Approaches to Stop Hypertension." The DASH eating plan is a healthy eating plan that has been shown to reduce high blood pressure (hypertension). Additional health benefits may include reducing the risk of type 2 diabetes mellitus, heart disease, and stroke. The DASH eating plan may also help with weight loss.  WHAT DO I NEED TO KNOW ABOUT THE DASH EATING PLAN?  For the DASH eating plan, you will follow these general guidelines:  · Choose foods with a percent daily value for sodium of less than 5% (as listed on the food label).  · Use salt-free seasonings or herbs instead of table salt or sea salt.  · Check with your health care provider or pharmacist before using salt substitutes.  · Eat lower-sodium products, often labeled as "lower sodium" or "no salt added."  · Eat fresh foods.  · Eat more vegetables, fruits, and low-fat dairy products.  · Choose whole grains. Look for the word "whole" as the first word in the ingredient list.  · Choose fish and skinless chicken or turkey more often than red meat. Limit fish, poultry, and meat to 6 oz (170 g) each day.  · Limit sweets, desserts, sugars, and sugary drinks.  · Choose heart-healthy fats.  · Limit cheese to 1 oz (28 g) per day.  · Eat more home-cooked food and less restaurant, buffet, and fast food.  · Limit fried foods.  · Cook foods using methods other than frying.  · Limit canned vegetables. If you do use them, rinse them well to decrease the sodium.  · When eating at a restaurant, ask that your food be prepared with less salt, or no salt if possible.  WHAT FOODS CAN I EAT?  Seek help from a dietitian for individual calorie needs.  Grains  Whole grain or whole wheat bread. Brown rice. Whole grain or whole wheat pasta. Quinoa, bulgur, and whole grain cereals. Low-sodium cereals. Corn or whole wheat flour tortillas. Whole grain cornbread. Whole grain crackers. Low-sodium crackers.  Vegetables  Fresh or frozen vegetables  (raw, steamed, roasted, or grilled). Low-sodium or reduced-sodium tomato and vegetable juices. Low-sodium or reduced-sodium tomato sauce and paste. Low-sodium or reduced-sodium canned vegetables.   Fruits  All fresh, canned (in natural juice), or frozen fruits.  Meat and Other Protein Products  Ground beef (85% or leaner), grass-fed beef, or beef trimmed of fat. Skinless chicken or turkey. Ground chicken or turkey. Pork trimmed of fat. All fish and seafood. Eggs. Dried beans, peas, or lentils. Unsalted nuts and seeds. Unsalted canned beans.  Dairy  Low-fat dairy products, such as skim or 1% milk, 2% or reduced-fat cheeses, low-fat ricotta or cottage cheese, or plain low-fat yogurt. Low-sodium or reduced-sodium cheeses.  Fats and Oils  Tub margarines without trans fats. Light or reduced-fat mayonnaise and salad dressings (reduced sodium). Avocado. Safflower, olive, or canola oils. Natural peanut or almond butter.  Other  Unsalted popcorn and pretzels.  The items listed above may not be a complete list of recommended foods or beverages. Contact your dietitian for more options.  WHAT FOODS ARE NOT RECOMMENDED?  Grains  White bread. White pasta. White rice. Refined cornbread. Bagels and croissants. Crackers that contain trans fat.  Vegetables  Creamed or fried vegetables. Vegetables in a cheese sauce. Regular canned vegetables. Regular canned tomato sauce and paste. Regular tomato and vegetable juices.  Fruits  Dried fruits. Canned fruit in light or heavy syrup. Fruit juice.  Meat and Other Protein   Products  Fatty cuts of meat. Ribs, chicken wings, bacon, sausage, bologna, salami, chitterlings, fatback, hot dogs, bratwurst, and packaged luncheon meats. Salted nuts and seeds. Canned beans with salt.  Dairy  Whole or 2% milk, cream, half-and-half, and cream cheese. Whole-fat or sweetened yogurt. Full-fat cheeses or blue cheese. Nondairy creamers and whipped toppings. Processed cheese, cheese spreads, or cheese  curds.  Condiments  Onion and garlic salt, seasoned salt, table salt, and sea salt. Canned and packaged gravies. Worcestershire sauce. Tartar sauce. Barbecue sauce. Teriyaki sauce. Soy sauce, including reduced sodium. Steak sauce. Fish sauce. Oyster sauce. Cocktail sauce. Horseradish. Ketchup and mustard. Meat flavorings and tenderizers. Bouillon cubes. Hot sauce. Tabasco sauce. Marinades. Taco seasonings. Relishes.  Fats and Oils  Butter, stick margarine, lard, shortening, ghee, and bacon fat. Coconut, palm kernel, or palm oils. Regular salad dressings.  Other  Pickles and olives. Salted popcorn and pretzels.  The items listed above may not be a complete list of foods and beverages to avoid. Contact your dietitian for more information.  WHERE CAN I FIND MORE INFORMATION?  National Heart, Lung, and [employer] Institute: www.nhlbi.nih.gov/health/health-topics/topics/dash/     This information is not intended to replace advice given to you by your health care provider. Make sure you discuss any questions you have with your health care provider.     Document Released: 09/29/2011 Document Revised: 10/31/2014 Document Reviewed: 08/14/2013  Elsevier Interactive Patient Education ©2016 Elsevier Inc.

## 2016-08-25 NOTE — Progress Notes (Signed)
Subjective:    Patient ID: Paul Murillo, male    DOB: [birth year], 60 y.o.   MRN: [id number]  Pt presents to the office today for chronic follow up. Pt is currently being treated for prostate cancer and is followed by Urology. Pt states he completed his radiation July 2017.   Hypertension  This is a chronic problem. The current episode started more than 1 year ago. The problem has been waxing and waning since onset. The problem is uncontrolled. Pertinent negatives include no anxiety, blurred vision, headaches, palpitations, peripheral edema or shortness of breath. Risk factors for coronary artery disease include dyslipidemia, male gender, sedentary lifestyle, smoking/tobacco exposure, stress and family history. Past treatments include diuretics and angiotensin blockers. The current treatment provides mild improvement. Hypertensive end-organ damage includes CAD/MI. There is no history of kidney disease, CVA, heart failure or a thyroid problem. There is no history of sleep apnea.  Hyperlipidemia  This is a chronic problem. The current episode started more than 1 year ago. The problem is controlled. Recent lipid tests were reviewed and are normal. He has no history of diabetes. Factors aggravating his hyperlipidemia include smoking. Pertinent negatives include no shortness of breath. Current antihyperlipidemic treatment includes statins (Pt states he has been out of his medications for a few months). Compliance problems include adherence to exercise and medication side effects.  Risk factors for coronary artery disease include dyslipidemia, family history, hypertension, male sex, post-menopausal and stress.  Gastroesophageal Reflux  He reports no belching, no coughing or no heartburn. This is a chronic problem. The current episode started more than 1 year ago. The problem occurs rarely. The problem has been resolved. The symptoms are aggravated by certain foods. He has tried a PPI for the symptoms. The  treatment provided significant relief.      Review of Systems  Constitutional: Negative.   HENT: Negative.   Eyes: Negative for blurred vision.  Respiratory: Negative.  Negative for cough and shortness of breath.   Cardiovascular: Negative.  Negative for palpitations.  Gastrointestinal: Negative.  Negative for heartburn.  Endocrine: Negative.   Genitourinary: Negative.   Musculoskeletal: Negative.   Neurological: Negative.  Negative for headaches.  Hematological: Negative.   Psychiatric/Behavioral: Negative.   All other systems reviewed and are negative.      Objective:   Physical Exam  Constitutional: He is oriented to person, place, and time. He appears well-developed and well-nourished. No distress.  HENT:  Head: Normocephalic.  Right Ear: External ear normal.  Left Ear: External ear normal.  Nose: Nose normal.  Mouth/Throat: Oropharynx is clear and moist.  Eyes: Pupils are equal, round, and reactive to light. Right eye exhibits no discharge. Left eye exhibits no discharge.  Neck: Normal range of motion. Neck supple. No thyromegaly present.  Cardiovascular: Normal rate, regular rhythm, normal heart sounds and intact distal pulses.   No murmur heard. Pulmonary/Chest: Effort normal and breath sounds normal. No respiratory distress. He has no wheezes.  Abdominal: Soft. Bowel sounds are normal. He exhibits no distension. There is no tenderness.  Musculoskeletal: Normal range of motion. He exhibits no edema or tenderness.  Neurological: He is alert and oriented to person, place, and time. He has normal reflexes. No cranial nerve deficit.  Skin: Skin is warm and dry. No rash noted. No erythema.  Psychiatric: He has a normal mood and affect. His behavior is normal. Judgment and thought content normal.  Vitals reviewed.    BP (!) 165/97   Pulse 84   Temp  97.5 F (36.4 C) (Oral)   Ht 6' 1"  (1.854 m)   Wt 186 lb 6.4 oz (84.6 kg)   BMI 24.59 kg/m       Assessment &  Plan:  1. Essential hypertension -Norvasc 5 mg started today -Dash diet information given -Exercise encouraged - Stress Management  -Continue current meds -RTO in 2 weeks - amLODipine (NORVASC) 5 MG tablet; Take 1 tablet (5 mg total) by mouth daily.  Dispense: 90 tablet; Refill: 3 - losartan-hydrochlorothiazide (HYZAAR) 100-12.5 MG tablet; TAKE 1 TABLET BY MOUTH BY MOUTH DAILY.  Dispense: 90 tablet; Refill: 2 - CMP14+EGFR  2. Mixed hyperlipidemia - CMP14+EGFR - Lipid panel - atorvastatin (LIPITOR) 40 MG tablet; Take 1 tablet (40 mg total) by mouth daily.  Dispense: 90 tablet; Refill: 3  3. Smoker - CMP14+EGFR  4. Vitamin D deficiency - CMP14+EGFR - VITAMIN D 25 Hydroxy (Vit-D Deficiency, Fractures)  5. Malignant neoplasm of prostate (HCC) - CMP14+EGFR  6. Gastroesophageal reflux disease, esophagitis presence not specifie - pantoprazole (PROTONIX) 40 MG tablet; Take 1 tablet (40 mg total) by mouth daily.  Dispense: 90 tablet; Refill: 2 - CMP14+EGFR   Continue all meds Labs pending Health Maintenance reviewed Diet and exercise encouraged RTO 2 weeks to recheck HTN  [name], FNP

## 2016-08-26 LAB — CMP14+EGFR
ALBUMIN: 4.5 g/dL (ref 3.6–4.8)
ALK PHOS: 74 IU/L (ref 39–117)
ALT: 24 IU/L (ref 0–44)
AST: 26 IU/L (ref 0–40)
Albumin/Globulin Ratio: 1.7 (ref 1.2–2.2)
BILIRUBIN TOTAL: 0.8 mg/dL (ref 0.0–1.2)
BUN / CREAT RATIO: 9 — AB (ref 10–24)
BUN: 10 mg/dL (ref 8–27)
CHLORIDE: 100 mmol/L (ref 96–106)
CO2: 26 mmol/L (ref 18–29)
Calcium: 9.8 mg/dL (ref 8.6–10.2)
Creatinine, Ser: 1.16 mg/dL (ref 0.76–1.27)
GFR calc Af Amer: 78 mL/min/{1.73_m2} (ref >59)
GFR calc non Af Amer: 67 mL/min/{1.73_m2} (ref >59)
GLOBULIN, TOTAL: 2.7 g/dL (ref 1.5–4.5)
Glucose: 93 mg/dL (ref 65–99)
Potassium: 4.5 mmol/L (ref 3.5–5.2)
SODIUM: 140 mmol/L (ref 134–144)
Total Protein: 7.2 g/dL (ref 6.0–8.5)

## 2016-08-26 LAB — VITAMIN D 25 HYDROXY (VIT D DEFICIENCY, FRACTURES): Vit D, 25-Hydroxy: 33.3 ng/mL (ref 30.0–100.0)

## 2016-08-26 LAB — LIPID PANEL
CHOLESTEROL TOTAL: 160 mg/dL (ref 100–199)
Chol/HDL Ratio: 2.8 ratio units (ref 0.0–5.0)
HDL: 57 mg/dL (ref >39)
LDL Calculated: 88 mg/dL (ref 0–99)
Triglycerides: 75 mg/dL (ref 0–149)
VLDL Cholesterol Cal: 15 mg/dL (ref 5–40)

## 2016-09-13 ENCOUNTER — Encounter: Payer: Self-pay | Admitting: Family

## 2016-09-13 ENCOUNTER — Ambulatory Visit (INDEPENDENT_AMBULATORY_CARE_PROVIDER_SITE_OTHER): Payer: Commercial Managed Care - HMO | Admitting: Family

## 2016-09-13 VITALS — BP 134/90 | HR 91 | Temp 97.2°F | Ht 73.0 in | Wt 186.8 lb

## 2016-09-13 DIAGNOSIS — I10 Essential (primary) hypertension: Secondary | ICD-10-CM | POA: Diagnosis not present

## 2016-09-13 NOTE — Patient Instructions (Signed)
Hypertension Hypertension, commonly called high blood pressure, is when the force of blood pumping through your arteries is too strong. Your arteries are the blood vessels that carry blood from your heart throughout your body. A blood pressure reading consists of a higher number over a lower number, such as 110/72. The higher number (systolic) is the pressure inside your arteries when your heart pumps. The lower number (diastolic) is the pressure inside your arteries when your heart relaxes. Ideally you want your blood pressure below 120/80. Hypertension forces your heart to work harder to pump blood. Your arteries may become narrow or stiff. Having untreated or uncontrolled hypertension can cause heart attack, stroke, kidney disease, and other problems. What increases the risk? Some risk factors for high blood pressure are controllable. Others are not. Risk factors you cannot control include:  Race. You may be at higher risk if you are African American.  Age. Risk increases with age.  Gender. Men are at higher risk than women before age 45 years. After age 65, women are at higher risk than men. Risk factors you can control include:  Not getting enough exercise or physical activity.  Being overweight.  Getting too much fat, sugar, calories, or salt in your diet.  Drinking too much alcohol. What are the signs or symptoms? Hypertension does not usually cause signs or symptoms. Extremely high blood pressure (hypertensive crisis) may cause headache, anxiety, shortness of breath, and nosebleed. How is this diagnosed? To check if you have hypertension, your health care provider will measure your blood pressure while you are seated, with your arm held at the level of your heart. It should be measured at least twice using the same arm. Certain conditions can cause a difference in blood pressure between your right and left arms. A blood pressure reading that is higher than normal on one occasion does  not mean that you need treatment. If it is not clear whether you have high blood pressure, you may be asked to return on a different day to have your blood pressure checked again. Or, you may be asked to monitor your blood pressure at home for 1 or more weeks. How is this treated? Treating high blood pressure includes making lifestyle changes and possibly taking medicine. Living a healthy lifestyle can help lower high blood pressure. You may need to change some of your habits. Lifestyle changes may include:  Following the DASH diet. This diet is high in fruits, vegetables, and whole grains. It is low in salt, red meat, and added sugars.  Keep your sodium intake below 2,300 mg per day.  Getting at least 30-45 minutes of aerobic exercise at least 4 times per week.  Losing weight if necessary.  Not smoking.  Limiting alcoholic beverages.  Learning ways to reduce stress. Your health care provider may prescribe medicine if lifestyle changes are not enough to get your blood pressure under control, and if one of the following is true:  You are 18-59 years of age and your systolic blood pressure is above 140.  You are 60 years of age or older, and your systolic blood pressure is above 150.  Your diastolic blood pressure is above 90.  You have diabetes, and your systolic blood pressure is over 140 or your diastolic blood pressure is over 90.  You have kidney disease and your blood pressure is above 140/90.  You have heart disease and your blood pressure is above 140/90. Your personal target blood pressure may vary depending on your medical   conditions, your age, and other factors. Follow these instructions at home:  Have your blood pressure rechecked as directed by your health care provider.  Take medicines only as directed by your health care provider. Follow the directions carefully. Blood pressure medicines must be taken as prescribed. The medicine does not work as well when you skip  doses. Skipping doses also puts you at risk for problems.  Do not smoke.  Monitor your blood pressure at home as directed by your health care provider. Contact a health care provider if:  You think you are having a reaction to medicines taken.  You have recurrent headaches or feel dizzy.  You have swelling in your ankles.  You have trouble with your vision. Get help right away if:  You develop a severe headache or confusion.  You have unusual weakness, numbness, or feel faint.  You have severe chest or abdominal pain.  You vomit repeatedly.  You have trouble breathing. This information is not intended to replace advice given to you by your health care provider. Make sure you discuss any questions you have with your health care provider. Document Released: 10/10/2005 Document Revised: 03/17/2016 Document Reviewed: 08/02/2013 Elsevier Interactive Patient Education  2017 Elsevier Inc.  

## 2016-09-13 NOTE — Progress Notes (Signed)
   Subjective:    Patient ID: Paul Murillo, male    DOB: [birth year], 60 y.o.   MRN: [id number]  Pt presents to the office today to recheck HTN. PT's BP is at goal today! Hypertension  This is a chronic problem. The current episode started more than 1 year ago. The problem has been resolved since onset. The problem is controlled. Pertinent negatives include no blurred vision, headaches, palpitations, peripheral edema or shortness of breath. Past treatments include calcium channel blockers, angiotensin blockers and diuretics. The current treatment provides moderate improvement. Hypertensive end-organ damage includes CAD/MI. There is no history of kidney disease, CVA, heart failure or a thyroid problem.      Review of Systems  Eyes: Negative for blurred vision.  Respiratory: Negative for shortness of breath.   Cardiovascular: Negative for palpitations.  Neurological: Negative for headaches.  All other systems reviewed and are negative.      Objective:   Physical Exam  Constitutional: He is oriented to person, place, and time. He appears well-developed and well-nourished. No distress.  Cardiovascular: Normal rate, regular rhythm, normal heart sounds and intact distal pulses.   No murmur heard. Pulmonary/Chest: Effort normal and breath sounds normal. No respiratory distress. He has no wheezes.  Abdominal: Soft. Bowel sounds are normal. He exhibits no distension. There is no tenderness.  Musculoskeletal: Normal range of motion. He exhibits no edema or tenderness.  Neurological: He is alert and oriented to person, place, and time.  Skin: Skin is warm and dry. No rash noted. No erythema.  Psychiatric: He has a normal mood and affect. His behavior is normal. Judgment and thought content normal.  Vitals reviewed.     BP 134/90   Pulse 91   Temp 97.2 F (36.2 C) (Oral)   Ht _0  (1.854 m)   Wt 186 lb 12.8 oz (84.7 kg)   BMI 24.65 kg/m      Assessment & Plan:  1. Essential  hypertension -Dash diet information given -Exercise encouraged - Stress Management  -Continue current meds -RTO in 6 months - BMP8+EGFR  [name], FNP

## 2016-09-14 LAB — BMP8+EGFR
BUN / CREAT RATIO: 10 (ref 10–24)
BUN: 11 mg/dL (ref 8–27)
CO2: 23 mmol/L (ref 18–29)
CREATININE: 1.12 mg/dL (ref 0.76–1.27)
Calcium: 10.1 mg/dL (ref 8.6–10.2)
Chloride: 97 mmol/L (ref 96–106)
GFR calc Af Amer: 81 mL/min/{1.73_m2} (ref >59)
GFR, EST NON AFRICAN AMERICAN: 70 mL/min/{1.73_m2} (ref >59)
GLUCOSE: 129 mg/dL — AB (ref 65–99)
Potassium: 4 mmol/L (ref 3.5–5.2)
Sodium: 139 mmol/L (ref 134–144)

## 2016-11-18 ENCOUNTER — Emergency Department (HOSPITAL_COMMUNITY): Payer: Commercial Managed Care - HMO

## 2016-11-18 ENCOUNTER — Encounter: Payer: Self-pay | Admitting: Family Medicine

## 2016-11-18 ENCOUNTER — Ambulatory Visit (INDEPENDENT_AMBULATORY_CARE_PROVIDER_SITE_OTHER): Payer: Commercial Managed Care - HMO | Admitting: Family Medicine

## 2016-11-18 ENCOUNTER — Encounter (HOSPITAL_COMMUNITY): Payer: Self-pay | Admitting: *Deleted

## 2016-11-18 ENCOUNTER — Emergency Department (HOSPITAL_COMMUNITY)
Admission: EM | Admit: 2016-11-18 | Discharge: 2016-11-18 | Disposition: A | Discharge status: Home / Self Care | Payer: Commercial Managed Care - HMO | Attending: Emergency Medicine | Admitting: Emergency Medicine

## 2016-11-18 VITALS — BP 142/86 | HR 76 | Temp 98.5°F | Ht 73.0 in | Wt 193.0 lb

## 2016-11-18 DIAGNOSIS — Z7982 Long term (current) use of aspirin: Secondary | ICD-10-CM | POA: Diagnosis not present

## 2016-11-18 DIAGNOSIS — I2510 Atherosclerotic heart disease of native coronary artery without angina pectoris: Secondary | ICD-10-CM | POA: Insufficient documentation

## 2016-11-18 DIAGNOSIS — I10 Essential (primary) hypertension: Secondary | ICD-10-CM | POA: Insufficient documentation

## 2016-11-18 DIAGNOSIS — Z79899 Other long term (current) drug therapy: Secondary | ICD-10-CM | POA: Insufficient documentation

## 2016-11-18 DIAGNOSIS — R55 Syncope and collapse: Secondary | ICD-10-CM | POA: Diagnosis not present

## 2016-11-18 DIAGNOSIS — F17200 Nicotine dependence, unspecified, uncomplicated: Secondary | ICD-10-CM | POA: Diagnosis not present

## 2016-11-18 LAB — URINALYSIS, ROUTINE W REFLEX MICROSCOPIC
BILIRUBIN URINE: NEGATIVE
GLUCOSE, UA: NEGATIVE mg/dL
Hgb urine dipstick: NEGATIVE
KETONES UR: NEGATIVE mg/dL
LEUKOCYTES UA: NEGATIVE
Nitrite: NEGATIVE
PH: 7 (ref 5.0–8.0)
Protein, ur: NEGATIVE mg/dL
Specific Gravity, Urine: 1.001 — ABNORMAL LOW (ref 1.005–1.030)

## 2016-11-18 LAB — CBC
HEMATOCRIT: 41.7 % (ref 39.0–52.0)
Hemoglobin: 14.3 g/dL (ref 13.0–17.0)
MCH: 31.4 pg (ref 26.0–34.0)
MCHC: 34.3 g/dL (ref 30.0–36.0)
MCV: 91.4 fL (ref 78.0–100.0)
Platelets: 261 10*3/uL (ref 150–400)
RBC: 4.56 MIL/uL (ref 4.22–5.81)
RDW: 13.6 % (ref 11.5–15.5)
WBC: 8.4 10*3/uL (ref 4.0–10.5)

## 2016-11-18 LAB — BASIC METABOLIC PANEL
Anion gap: 10 (ref 5–15)
BUN: 11 mg/dL (ref 6–20)
CHLORIDE: 98 mmol/L — AB (ref 101–111)
CO2: 27 mmol/L (ref 22–32)
Calcium: 9.9 mg/dL (ref 8.9–10.3)
Creatinine, Ser: 1.16 mg/dL (ref 0.61–1.24)
GFR calc Af Amer: >60 mL/min (ref >60)
GFR calc non Af Amer: >60 mL/min (ref >60)
GLUCOSE: 104 mg/dL — AB (ref 65–99)
Potassium: 3.8 mmol/L (ref 3.5–5.1)
Sodium: 135 mmol/L (ref 135–145)

## 2016-11-18 LAB — CBG MONITORING, ED: Glucose-Capillary: 125 mg/dL — ABNORMAL HIGH (ref 65–99)

## 2016-11-18 NOTE — Discharge Instructions (Signed)
Tests including CT head, blood work, EKG showed no life-threatening condition. Follow-up your primary care doctor.

## 2016-11-18 NOTE — ED Provider Notes (Signed)
[facility] DEPT Provider Note   CSN: [id number] Arrival date & time: 11/18/16  [id number]  By signing my name below, I, Judithe Modest, attest that this documentation has been prepared under the direction and in the presence of Nat Christen, MD. Electronically Signed: Judithe Modest, ER Scribe. 06/04/2016. 11:06 AM.   History   Chief Complaint Chief Complaint  Patient presents with  . Loss of Consciousness   The history is provided by the patient. No language interpreter was used.   HPI Comments: Mori Husein is a 61 y.o. male who presents to the Emergency Department complaining of LOC this morning. He was sitting in a chair at work and suddenly felt very hot and then passed out and slumped on the floor. He was out for 10 minutes. He lost control of his bladder when he passed out. He has a past surgical hx of multiple stent placement in heart. He has a PMHx of HTN. He has no past hx of DM. He denies cold sx, weakness, numbness cough, nausea, vomiting or CP. Haynes Hoehn is his PCP.    Past Medical History:  Diagnosis Date  . Coronary artery disease   . ED (erectile dysfunction)   . Genitourinary disorder   . Hypercholesteremia   . Hypertension   . MI (myocardial infarction)   . Reflux   . Syncope   . Vitamin D deficiency     Patient Active Problem List   Diagnosis Date Noted  . Dysuria 07/06/2016  . Malignant neoplasm of prostate ([place]) 01/18/2016  . Esophageal reflux 08/25/2015  . CAD S/P percutaneous coronary angioplasty 02/07/2014  . Hyperlipemia 01/13/2011  . HTN (hypertension) 01/13/2011  . Smoker 01/13/2011  . Vitamin D deficiency 01/13/2011  . ED (erectile dysfunction) 01/13/2011  . SYNCOPE 12/14/2010  . CHEST PAIN 12/14/2010    Past Surgical History:  Procedure Laterality Date  . APPENDECTOMY    . HERNIA REPAIR    . LEFT HEART CATHETERIZATION WITH CORONARY ANGIOGRAM N/A 01/29/2014   Procedure: LEFT HEART CATHETERIZATION WITH CORONARY ANGIOGRAM;  Surgeon:  Peter M Martinique, MD;  Location: [facility] CATH LAB;  Service: Cardiovascular;  Laterality: N/A;  . TOTAL HIP ARTHROPLASTY         Home Medications    Prior to Admission medications   Medication Sig Start Date End Date Taking? Authorizing Provider  amLODipine (NORVASC) 5 MG tablet Take 1 tablet (5 mg total) by mouth daily. 08/25/16  Yes Sharion Balloon, FNP  aspirin 81 MG EC tablet Take 81 mg by mouth daily.     Yes Historical Provider, MD  atorvastatin (LIPITOR) 40 MG tablet Take 1 tablet (40 mg total) by mouth daily. 08/25/16  Yes Sharion Balloon, FNP  losartan-hydrochlorothiazide (HYZAAR) 100-12.5 MG tablet TAKE 1 TABLET BY MOUTH BY MOUTH DAILY. 08/25/16  Yes Sharion Balloon, FNP  nitroGLYCERIN (NITROSTAT) 0.4 MG SL tablet Place 0.4 mg under the tongue every 5 (five) minutes as needed for chest pain. Reported on 04/01/2016   Yes Historical Provider, MD  pantoprazole (PROTONIX) 40 MG tablet Take 1 tablet (40 mg total) by mouth daily. 08/25/16  Yes Sharion Balloon, FNP  prasugrel (EFFIENT) 10 MG TABS Take 10 mg by mouth daily. Reported on 01/18/2016   Yes Historical Provider, MD  vardenafil (LEVITRA) 20 MG tablet Take 20 mg by mouth daily as needed for erectile dysfunction.    Yes Historical Provider, MD  Vitamin D, Ergocalciferol, (DRISDOL) 50000 units CAPS capsule Take 1 capsule (50,000 Units total)  by mouth every 7 (seven) days. 08/25/16  Yes Sharion Balloon, FNP    Family History Family History  Problem Relation Age of Onset  . Heart disease Father   . Hypertension Father   . Diabetes Brother   . Cancer Neg Hx     Social History Social History  Substance Use Topics  . Smoking status: Current Some Day Smoker  . Smokeless tobacco: Never Used  . Alcohol use 0.0 oz/week     Comment: occ.     Allergies   Patient has no known allergies.   Review of Systems Review of Systems  Constitutional: Negative for chills and fever.  HENT: Negative for rhinorrhea and sinus pressure.   Respiratory:  Negative for cough and shortness of breath.   Cardiovascular: Negative for chest pain and palpitations.  Gastrointestinal: Negative for abdominal pain, nausea and vomiting.  Neurological: Positive for light-headedness. Negative for weakness and numbness.   A complete 10 system review of systems was obtained and all systems are negative except as noted in the HPI and PMH.    Physical Exam Updated Vital Signs BP (!) 142/121   Pulse 81   Temp 97.9 F (36.6 C) (Oral)   Resp 14   Ht 6\' 1"  (1.854 m)   Wt 193 lb (87.5 kg)   SpO2 100%   BMI 25.46 kg/m   Physical Exam  Constitutional: He is oriented to person, place, and time. He appears well-developed and well-nourished. No distress.  HENT:  Head: Normocephalic and atraumatic.  Eyes: Pupils are equal, round, and reactive to light.  Neck: Neck supple.  Cardiovascular: Normal rate.   Pulmonary/Chest: Effort normal. No respiratory distress.  Musculoskeletal: Normal range of motion.  Neurological: He is alert and oriented to person, place, and time. Coordination normal.  Skin: Skin is warm and dry. He is not diaphoretic.  Psychiatric: He has a normal mood and affect. His behavior is normal.  Nursing note and vitals reviewed.    ED Treatments / Results  DIAGNOSTIC STUDIES: Oxygen Saturation is 100% on RA, normal by my interpretation.    COORDINATION OF CARE: 10:14 AM Discussed treatment plan with pt at bedside and pt agreed to plan.  Labs (all labs ordered are listed, but only abnormal results are displayed) Labs Reviewed  BASIC METABOLIC PANEL - Abnormal; Notable for the following:       Result Value   Chloride 98 (*)    Glucose, Bld 104 (*)    All other components within normal limits  URINALYSIS, ROUTINE W REFLEX MICROSCOPIC - Abnormal; Notable for the following:    Color, Urine COLORLESS (*)    Specific Gravity, Urine 1.001 (*)    All other components within normal limits  CBG MONITORING, ED - Abnormal; Notable for the  following:    Glucose-Capillary 125 (*)    All other components within normal limits  CBC    EKG  EKG Interpretation  Date/Time:  Friday November 18 2016 09:07:42 EST Ventricular Rate:  76 PR Interval:  170 QRS Duration: 68 QT Interval:  398 QTC Calculation: 447 R Axis:   49 Text Interpretation:  Normal sinus rhythm Nonspecific T wave abnormality Abnormal ECG Confirmed by Lacinda Axon  MD, Lourdes Kucharski ([id number]) on 11/18/2016 9:24:13 AM       Radiology Ct Head Wo Contrast  Result Date: 11/18/2016 CLINICAL DATA:  Syncope. EXAM: CT HEAD WITHOUT CONTRAST TECHNIQUE: Contiguous axial images were obtained from the base of the skull through the vertex without intravenous contrast. COMPARISON:  CT scan of October 21, 2014. FINDINGS: Brain: No evidence of acute infarction, hemorrhage, hydrocephalus, extra-axial collection or mass lesion/mass effect. Vascular: Atherosclerosis of carotid siphons is noted. Skull: Normal. Negative for fracture or focal lesion. Sinuses/Orbits: Mild bilateral ethmoid and frontal sinusitis is noted. Other: None. IMPRESSION: No acute intracranial abnormality seen. Electronically Signed   By: Marijo Conception, M.D.   On: 11/18/2016 13:07    Procedures Procedures (including critical care time)  Medications Ordered in ED Medications - No data to display   Initial Impression / Assessment and Plan / ED Course  I have reviewed the triage vital signs and the nursing notes.  Pertinent labs & imaging results that were available during my care of the patient were reviewed by me and considered in my medical decision making (see chart for details).    Patient is alert, neurologically intact, hemodynamically stable. Screening labs, EKG, CT head all negative. Will follow-up with primary care.  Final Clinical Impressions(s) / ED Diagnoses   Final diagnoses:  Syncope, unspecified syncope type    New Prescriptions New Prescriptions   No medications on file     I personally  performed the services described in this documentation, which was scribed in my presence. The recorded information has been reviewed and is accurate.         Nat Christen, MD 11/18/16 1325

## 2016-11-18 NOTE — Patient Instructions (Signed)
Please go to the emergency room for evaluation right away

## 2016-11-18 NOTE — Progress Notes (Signed)
   HPI  Patient presents today with syncope.  Patient explains that just before 6 AM this morning he was sitting in the break room at work. He suddenly felt hot and then passed out falling over to the ground. He lost bladder control. The side was witnessed by someone in the room who describes that he just simply fell to the ground. He is not sure how long he was out.  He feels fine currently is drinking a cup coffee.  No current symptoms of acute illness. He denies dyspnea, cough, shortness of breath. He denies any chest pain or rapid heartbeat.  She has a history of CAD with stenting in November 2013, also his history of prostate cancer. He has episode of similar syncope in May 2017.  PMH: Smoking status noted ROS: Per HPI  Objective: BP (!) 142/86   Pulse 76   Temp 98.5 F (36.9 C) (Oral)   Ht 6\' 1"  (1.854 m)   Wt 193 lb (87.5 kg)   BMI 25.46 kg/m  Gen: NAD, alert, cooperative with exam HEENT: NCAT CV: irregularly irregular, no murmur initiated Resp: CTABL, no wheezes, non-labored Ext: No edema, warm Neuro: Alert and oriented, No gross deficits  Assessment and plan:  # Syncope High concern for some sort of cardiogenic or neurogenic syncope. EKG is completely normal on my exam, however on the previous heart evaluation he distinctly had irregularly irregular heart rhythm. He also has a history of prostate cancer, MRI of the brain would be most prudent. I've recommended given age and comorbidities that he go to the emergency room for monitoring and further workup. Patient's agreeable to the plan.     Orders Placed This Encounter  Procedures  . EKG 12-Lead    Laroy Apple, MD [facility] 11/18/2016, 8:06 AM

## 2016-11-18 NOTE — ED Notes (Signed)
From CT 

## 2016-11-18 NOTE — ED Notes (Signed)
Patient transported to CT 

## 2016-11-18 NOTE — ED Triage Notes (Signed)
Pt states he was sitting down this morning when he became hot and had an episode of syncope. This occurred at 5:50 this morning. Pt states he has felt ok since that happened. Denies any symptoms at present. Pt is alert and oriented x4. States he had a similar episode 6 months ago and was observed in the ED and discharged home. Pt states he lost bladder control this time. Denies any history of seizures. Denies hitting his head.

## 2017-02-16 DIAGNOSIS — I10 Essential (primary) hypertension: Secondary | ICD-10-CM | POA: Diagnosis not present

## 2017-02-16 DIAGNOSIS — I2510 Atherosclerotic heart disease of native coronary artery without angina pectoris: Secondary | ICD-10-CM | POA: Diagnosis not present

## 2017-02-16 DIAGNOSIS — C61 Malignant neoplasm of prostate: Secondary | ICD-10-CM | POA: Diagnosis not present

## 2017-03-13 ENCOUNTER — Ambulatory Visit (INDEPENDENT_AMBULATORY_CARE_PROVIDER_SITE_OTHER): Payer: Commercial Managed Care - HMO | Admitting: Family

## 2017-03-13 ENCOUNTER — Encounter: Payer: Self-pay | Admitting: Family

## 2017-03-13 VITALS — BP 137/91 | HR 74 | Temp 97.7°F | Ht 73.0 in | Wt 189.4 lb

## 2017-03-13 DIAGNOSIS — E782 Mixed hyperlipidemia: Secondary | ICD-10-CM

## 2017-03-13 DIAGNOSIS — I2510 Atherosclerotic heart disease of native coronary artery without angina pectoris: Secondary | ICD-10-CM

## 2017-03-13 DIAGNOSIS — F17200 Nicotine dependence, unspecified, uncomplicated: Secondary | ICD-10-CM

## 2017-03-13 DIAGNOSIS — N529 Male erectile dysfunction, unspecified: Secondary | ICD-10-CM

## 2017-03-13 DIAGNOSIS — K219 Gastro-esophageal reflux disease without esophagitis: Secondary | ICD-10-CM | POA: Diagnosis not present

## 2017-03-13 DIAGNOSIS — I10 Essential (primary) hypertension: Secondary | ICD-10-CM | POA: Diagnosis not present

## 2017-03-13 DIAGNOSIS — E559 Vitamin D deficiency, unspecified: Secondary | ICD-10-CM

## 2017-03-13 DIAGNOSIS — C61 Malignant neoplasm of prostate: Secondary | ICD-10-CM

## 2017-03-13 DIAGNOSIS — Z9861 Coronary angioplasty status: Secondary | ICD-10-CM

## 2017-03-13 LAB — CMP14+EGFR
A/G RATIO: 2 (ref 1.2–2.2)
ALK PHOS: 67 IU/L (ref 39–117)
ALT: 16 IU/L (ref 0–44)
AST: 20 IU/L (ref 0–40)
Albumin: 4.8 g/dL (ref 3.6–4.8)
BUN/Creatinine Ratio: 11 (ref 10–24)
BUN: 13 mg/dL (ref 8–27)
Bilirubin Total: 0.3 mg/dL (ref 0.0–1.2)
CALCIUM: 9.8 mg/dL (ref 8.6–10.2)
CO2: 23 mmol/L (ref 18–29)
Chloride: 100 mmol/L (ref 96–106)
Creatinine, Ser: 1.22 mg/dL (ref 0.76–1.27)
GFR calc Af Amer: 73 mL/min/{1.73_m2} (ref >59)
GFR, EST NON AFRICAN AMERICAN: 63 mL/min/{1.73_m2} (ref >59)
Globulin, Total: 2.4 g/dL (ref 1.5–4.5)
Glucose: 106 mg/dL — ABNORMAL HIGH (ref 65–99)
POTASSIUM: 3.8 mmol/L (ref 3.5–5.2)
Sodium: 138 mmol/L (ref 134–144)
Total Protein: 7.2 g/dL (ref 6.0–8.5)

## 2017-03-13 LAB — LIPID PANEL
CHOL/HDL RATIO: 2.9 ratio (ref 0.0–5.0)
CHOLESTEROL TOTAL: 162 mg/dL (ref 100–199)
HDL: 56 mg/dL (ref >39)
LDL Calculated: 87 mg/dL (ref 0–99)
TRIGLYCERIDES: 94 mg/dL (ref 0–149)
VLDL Cholesterol Cal: 19 mg/dL (ref 5–40)

## 2017-03-13 MED ORDER — VARDENAFIL HCL 20 MG PO TABS: 20.0000 mg | ORAL_TABLET | Freq: Every day | ORAL | 5 refills | Status: DC | PRN

## 2017-03-13 NOTE — Patient Instructions (Signed)

## 2017-03-13 NOTE — Progress Notes (Signed)
Subjective:    Patient ID: Paul Murillo, male    DOB: 1956-01-14, 61 y.o.   MRN: [id number]  Pt presents to the office today for chronic follow up. Pt is followed by Urologists every 6 months for Prostate Cancer.  Hypertension  This is a chronic problem. The current episode started more than 1 year ago. The problem has been waxing and waning since onset. The problem is controlled. Associated symptoms include malaise/fatigue. Pertinent negatives include no headaches, peripheral edema or shortness of breath. Risk factors for coronary artery disease include family history and sedentary lifestyle. The current treatment provides moderate improvement. Hypertensive end-organ damage includes CAD/MI. There is no history of CVA or heart failure.  Hyperlipidemia  This is a chronic problem. The current episode started more than 1 year ago. Recent lipid tests were reviewed and are normal. He has no history of obesity. Pertinent negatives include no shortness of breath. Current antihyperlipidemic treatment includes statins. The current treatment provides moderate improvement of lipids. Risk factors for coronary artery disease include dyslipidemia, family history, hypertension and male sex.  Gastroesophageal Reflux  He complains of heartburn. He reports no belching or no dysphagia. This is a chronic problem. The current episode started more than 1 year ago. The problem occurs rarely. The problem has been rapidly improving. He has tried a PPI for the symptoms. The treatment provided moderate relief.  ED Pt currently taking Levitra 20. Stable    Review of Systems  Constitutional: Positive for malaise/fatigue.  Respiratory: Negative for shortness of breath.   Gastrointestinal: Positive for heartburn. Negative for dysphagia.  Neurological: Negative for headaches.  All other systems reviewed and are negative.      Objective:   Physical Exam  Constitutional: He is oriented to person, place, and time. He  appears well-developed and well-nourished. No distress.  HENT:  Head: Normocephalic.  Right Ear: External ear normal.  Left Ear: External ear normal.  Nose: Nose normal.  Mouth/Throat: Oropharynx is clear and moist.  Eyes: Pupils are equal, round, and reactive to light. Right eye exhibits no discharge. Left eye exhibits no discharge.  Neck: Normal range of motion. Neck supple. No thyromegaly present.  Cardiovascular: Normal rate, regular rhythm, normal heart sounds and intact distal pulses.   No murmur heard. Pulmonary/Chest: Effort normal and breath sounds normal. No respiratory distress. He has no wheezes.  Abdominal: Soft. Bowel sounds are normal. He exhibits no distension. There is no tenderness.  Musculoskeletal: Normal range of motion. He exhibits no edema or tenderness.  Neurological: He is alert and oriented to person, place, and time.  Skin: Skin is warm and dry. No rash noted. No erythema.  Psychiatric: He has a normal mood and affect. His behavior is normal. Judgment and thought content normal.  Vitals reviewed.     BP (!) 138/93   Pulse 78   Temp 97.7 F (36.5 C) (Oral)   Ht 6' 1"  (1.854 m)   Wt 189 lb 6.4 oz (85.9 kg)   BMI 24.99 kg/m      Assessment & Plan:  1. Essential hypertension - CMP14+EGFR  2. Vitamin D deficiency - CMP14+EGFR  3. Smoker - CMP14+EGFR  4. Mixed hyperlipidemia - CMP14+EGFR - Lipid panel  5. CAD S/P percutaneous coronary angioplast - CMP14+EGFR - Lipid panel  6. Gastroesophageal reflux disease, esophagitis presence not specifie - CMP14+EGFR  7. Malignant neoplasm of prostate ([place]) - Ambulatory referral to Urology - CMP14+EGFR   8. Erectile dysfunction, unspecified erectile dysfunction type - vardenafil (LEVITRA)  20 MG tablet; Take 1 tablet (20 mg total) by mouth daily as needed for erectile dysfunction.  Dispense: 10 tablet; Refill: 5   Continue all meds Labs pending Health Maintenance reviewed Diet and exercise  encouraged RTO 6 months   [name], FNP

## 2017-04-14 DIAGNOSIS — C61 Malignant neoplasm of prostate: Secondary | ICD-10-CM | POA: Diagnosis not present

## 2017-04-14 DIAGNOSIS — R350 Frequency of micturition: Secondary | ICD-10-CM | POA: Diagnosis not present

## 2017-04-14 DIAGNOSIS — N401 Enlarged prostate with lower urinary tract symptoms: Secondary | ICD-10-CM | POA: Diagnosis not present

## 2017-05-23 ENCOUNTER — Other Ambulatory Visit: Payer: Self-pay | Admitting: Family

## 2017-05-23 DIAGNOSIS — I10 Essential (primary) hypertension: Secondary | ICD-10-CM

## 2017-05-23 DIAGNOSIS — K219 Gastro-esophageal reflux disease without esophagitis: Secondary | ICD-10-CM

## 2017-08-16 DIAGNOSIS — Z23 Encounter for immunization: Secondary | ICD-10-CM | POA: Diagnosis not present

## 2017-08-21 ENCOUNTER — Other Ambulatory Visit: Payer: Self-pay | Admitting: Family

## 2017-08-21 DIAGNOSIS — I10 Essential (primary) hypertension: Secondary | ICD-10-CM

## 2017-08-21 DIAGNOSIS — E782 Mixed hyperlipidemia: Secondary | ICD-10-CM

## 2017-08-21 NOTE — Telephone Encounter (Signed)
Last seen 03/13/17  [facility]

## 2017-09-11 ENCOUNTER — Other Ambulatory Visit: Payer: Self-pay | Admitting: Family

## 2017-09-11 DIAGNOSIS — K219 Gastro-esophageal reflux disease without esophagitis: Secondary | ICD-10-CM

## 2017-09-13 ENCOUNTER — Ambulatory Visit: Payer: 59 | Admitting: Family

## 2017-09-13 ENCOUNTER — Encounter: Payer: Self-pay | Admitting: Family

## 2017-09-13 VITALS — BP 135/94 | HR 80 | Temp 97.4°F | Ht 73.0 in | Wt 192.2 lb

## 2017-09-13 DIAGNOSIS — E559 Vitamin D deficiency, unspecified: Secondary | ICD-10-CM

## 2017-09-13 DIAGNOSIS — K921 Melena: Secondary | ICD-10-CM | POA: Diagnosis not present

## 2017-09-13 DIAGNOSIS — Z9861 Coronary angioplasty status: Secondary | ICD-10-CM | POA: Diagnosis not present

## 2017-09-13 DIAGNOSIS — C61 Malignant neoplasm of prostate: Secondary | ICD-10-CM | POA: Diagnosis not present

## 2017-09-13 DIAGNOSIS — K219 Gastro-esophageal reflux disease without esophagitis: Secondary | ICD-10-CM | POA: Diagnosis not present

## 2017-09-13 DIAGNOSIS — I2510 Atherosclerotic heart disease of native coronary artery without angina pectoris: Secondary | ICD-10-CM | POA: Diagnosis not present

## 2017-09-13 DIAGNOSIS — I10 Essential (primary) hypertension: Secondary | ICD-10-CM | POA: Diagnosis not present

## 2017-09-13 DIAGNOSIS — E782 Mixed hyperlipidemia: Secondary | ICD-10-CM | POA: Diagnosis not present

## 2017-09-13 DIAGNOSIS — F17200 Nicotine dependence, unspecified, uncomplicated: Secondary | ICD-10-CM | POA: Diagnosis not present

## 2017-09-13 MED ORDER — ATORVASTATIN CALCIUM 40 MG PO TABS: 40.0000 mg | ORAL_TABLET | Freq: Every day | ORAL | 1 refills | Status: DC

## 2017-09-13 MED ORDER — PANTOPRAZOLE SODIUM 40 MG PO TBEC: 40.0000 mg | DELAYED_RELEASE_TABLET | Freq: Every day | ORAL | 1 refills | Status: DC

## 2017-09-13 MED ORDER — LOSARTAN POTASSIUM-HCTZ 100-25 MG PO TABS: 1.0000 | ORAL_TABLET | Freq: Every day | ORAL | 1 refills | Status: DC

## 2017-09-13 MED ORDER — AMLODIPINE BESYLATE 5 MG PO TABS: 5.0000 mg | ORAL_TABLET | Freq: Every day | ORAL | 1 refills | Status: DC

## 2017-09-13 MED ORDER — LOSARTAN POTASSIUM-HCTZ 100-12.5 MG PO TABS: ORAL_TABLET | ORAL | 1 refills | Status: DC

## 2017-09-13 MED ORDER — NITROGLYCERIN 0.4 MG SL SUBL: 0.4000 mg | SUBLINGUAL_TABLET | SUBLINGUAL | 1 refills | Status: DC | PRN

## 2017-09-13 NOTE — Progress Notes (Signed)
Subjective:    Patient ID: Paul Murillo, male    DOB: 1955/11/24, 61 y.o.   MRN: [id number]  Pt presents to the office today for chronic follow up. Pt is followed by Urologists every 6 months for Prostate Cancer. PT states he is followed by Cardiologists annually for CAD.   Pt states he has noticed dark blood in  His stools since Sunday. Pt reports have a virus where he had nausea, vomiting, and diarrhea, but wants to make sure this is normal.  Hyperlipidemia  This is a chronic problem. The current episode started more than 1 year ago. The problem is uncontrolled. Recent lipid tests were reviewed and are normal. Pertinent negatives include no shortness of breath. Current antihyperlipidemic treatment includes statins. The current treatment provides moderate improvement of lipids. Risk factors for coronary artery disease include dyslipidemia, hypertension, male sex, a sedentary lifestyle and family history.  Hypertension  This is a chronic problem. The current episode started more than 1 year ago. The problem has been resolved since onset. The problem is controlled. Pertinent negatives include no headaches, malaise/fatigue, peripheral edema or shortness of breath. Risk factors for coronary artery disease include dyslipidemia and sedentary lifestyle. The current treatment provides moderate improvement. Hypertensive end-organ damage includes CAD/MI. There is no history of CVA or heart failure.  Gastroesophageal Reflux  He reports no belching, no coughing, no heartburn or no stridor. This is a chronic problem. The current episode started more than 1 year ago. The problem occurs occasionally. The problem has been resolved. The symptoms are aggravated by certain foods. He has tried a PPI for the symptoms. The treatment provided moderate relief.      Review of Systems  Constitutional: Negative for malaise/fatigue.  Respiratory: Negative for cough and shortness of breath.   Gastrointestinal: Negative  for heartburn.  Neurological: Negative for headaches.  All other systems reviewed and are negative.      Objective:   Physical Exam  Constitutional: He is oriented to person, place, and time. He appears well-developed and well-nourished. No distress.  HENT:  Head: Normocephalic.  Right Ear: External ear normal.  Left Ear: External ear normal.  Nose: Nose normal.  Mouth/Throat: Oropharynx is clear and moist.  Eyes: Pupils are equal, round, and reactive to light. Right eye exhibits no discharge. Left eye exhibits no discharge.  Neck: Normal range of motion. Neck supple. No thyromegaly present.  Cardiovascular: Normal rate, regular rhythm, normal heart sounds and intact distal pulses.  No murmur heard. Pulmonary/Chest: Effort normal and breath sounds normal. No respiratory distress. He has no wheezes.  Abdominal: Soft. Bowel sounds are normal. He exhibits no distension. There is no tenderness.  Musculoskeletal: Normal range of motion. He exhibits no edema or tenderness.  Neurological: He is alert and oriented to person, place, and time.  Skin: Skin is warm and dry. No rash noted. No erythema.  Psychiatric: He has a normal mood and affect. His behavior is normal. Judgment and thought content normal.  Vitals reviewed.    BP (!) 135/94   Pulse 80   Temp (!) 97.4 F (36.3 C) (Oral)   Ht '6\' 1"'$  (1.854 m)   Wt 192 lb 3.2 oz (87.2 kg)   BMI 25.36 kg/m      Assessment & Plan:  1. Gastroesophageal reflux disease, esophagitis presence not specified - pantoprazole (PROTONIX) 40 MG tablet; Take 1 tablet (40 mg total) by mouth daily.  Dispense: 90 tablet; Refill: 1 - CMP14+EGFR  2. Essential hypertension HCTS increased  to 25 mg from 12.5 mg -Dash diet information given -Exercise encouraged - Stress Management  -Continue current meds -RTO in 1 month  - amLODipine (NORVASC) 5 MG tablet; Take 1 tablet (5 mg total) by mouth daily.  Dispense: 90 tablet; Refill: 1 - CMP14+EGFR -  losartan-hydrochlorothiazide (HYZAAR) 100-25 MG tablet; Take 1 tablet by mouth daily.  Dispense: 90 tablet; Refill: 1  3. Mixed hyperlipidemia - atorvastatin (LIPITOR) 40 MG tablet; Take 1 tablet (40 mg total) by mouth daily.  Dispense: 90 tablet; Refill: 1 - CMP14+EGFR - Lipid panel  4. Smoker - CMP14+EGFR  5. Vitamin D deficiency - CMP14+EGFR  6. CAD S/P percutaneous coronary angioplasty - CMP14+EGFR  7. Blood in stool - Fecal occult blood, imunochemical; Future - CBC with Differential/Platelet  8. Malignant neoplasm of prostate [facility]) - Ambulatory referral to Urology   Continue all meds Labs pending Health Maintenance reviewed Diet and exercise encouraged RTO 1 months   [name], FNP

## 2017-09-13 NOTE — Patient Instructions (Signed)

## 2017-09-14 LAB — CBC WITH DIFFERENTIAL/PLATELET
BASOS: 0 %
Basophils Absolute: 0 10*3/uL (ref 0.0–0.2)
EOS (ABSOLUTE): 0.3 10*3/uL (ref 0.0–0.4)
EOS: 3 %
HEMATOCRIT: 38.9 % (ref 37.5–51.0)
HEMOGLOBIN: 13.1 g/dL (ref 13.0–17.7)
IMMATURE GRANULOCYTES: 0 %
Immature Grans (Abs): 0 10*3/uL (ref 0.0–0.1)
LYMPHS ABS: 2.7 10*3/uL (ref 0.7–3.1)
Lymphs: 30 %
MCH: 31.3 pg (ref 26.6–33.0)
MCHC: 33.7 g/dL (ref 31.5–35.7)
MCV: 93 fL (ref 79–97)
MONOCYTES: 7 %
Monocytes Absolute: 0.7 10*3/uL (ref 0.1–0.9)
NEUTROS PCT: 60 %
Neutrophils Absolute: 5.4 10*3/uL (ref 1.4–7.0)
Platelets: 293 10*3/uL (ref 150–379)
RBC: 4.18 x10E6/uL (ref 4.14–5.80)
RDW: 14.9 % (ref 12.3–15.4)
WBC: 9 10*3/uL (ref 3.4–10.8)

## 2017-09-14 LAB — CMP14+EGFR
ALBUMIN: 4.7 g/dL (ref 3.6–4.8)
ALK PHOS: 68 IU/L (ref 39–117)
ALT: 21 IU/L (ref 0–44)
AST: 22 IU/L (ref 0–40)
Albumin/Globulin Ratio: 2 (ref 1.2–2.2)
BILIRUBIN TOTAL: 0.5 mg/dL (ref 0.0–1.2)
BUN / CREAT RATIO: 6 — AB (ref 10–24)
BUN: 7 mg/dL — AB (ref 8–27)
CHLORIDE: 103 mmol/L (ref 96–106)
CO2: 21 mmol/L (ref 20–29)
CREATININE: 1.16 mg/dL (ref 0.76–1.27)
Calcium: 9.9 mg/dL (ref 8.6–10.2)
GFR calc Af Amer: 77 mL/min/{1.73_m2} (ref >59)
GFR calc non Af Amer: 67 mL/min/{1.73_m2} (ref >59)
GLUCOSE: 97 mg/dL (ref 65–99)
Globulin, Total: 2.4 g/dL (ref 1.5–4.5)
Potassium: 4.3 mmol/L (ref 3.5–5.2)
Sodium: 140 mmol/L (ref 134–144)
Total Protein: 7.1 g/dL (ref 6.0–8.5)

## 2017-09-14 LAB — LIPID PANEL
CHOLESTEROL TOTAL: 154 mg/dL (ref 100–199)
Chol/HDL Ratio: 3 ratio (ref 0.0–5.0)
HDL: 51 mg/dL (ref >39)
LDL Calculated: 84 mg/dL (ref 0–99)
TRIGLYCERIDES: 94 mg/dL (ref 0–149)
VLDL CHOLESTEROL CAL: 19 mg/dL (ref 5–40)

## 2017-09-18 ENCOUNTER — Other Ambulatory Visit: Payer: Self-pay | Admitting: *Deleted

## 2017-11-06 ENCOUNTER — Other Ambulatory Visit: Payer: Self-pay | Admitting: Family

## 2017-11-06 DIAGNOSIS — I10 Essential (primary) hypertension: Secondary | ICD-10-CM

## 2017-11-06 MED ORDER — AMLODIPINE BESYLATE 5 MG PO TABS: 5.0000 mg | ORAL_TABLET | Freq: Every day | ORAL | 1 refills | Status: DC

## 2017-11-06 MED ORDER — LOSARTAN POTASSIUM-HCTZ 100-25 MG PO TABS: 1.0000 | ORAL_TABLET | Freq: Every day | ORAL | 1 refills | Status: DC

## 2017-11-06 MED ORDER — NITROGLYCERIN 0.4 MG SL SUBL: 0.4000 mg | SUBLINGUAL_TABLET | SUBLINGUAL | 1 refills | Status: AC | PRN

## 2017-11-06 NOTE — Telephone Encounter (Signed)
RX sent of BP medications and nitroglycerin pharmacy.

## 2017-11-07 ENCOUNTER — Ambulatory Visit: Payer: 59 | Admitting: Family

## 2017-11-14 ENCOUNTER — Ambulatory Visit: Payer: 59 | Admitting: Family

## 2017-11-14 ENCOUNTER — Encounter: Payer: Self-pay | Admitting: Family

## 2017-11-14 VITALS — BP 130/88 | HR 97 | Temp 97.7°F | Ht 73.0 in | Wt 190.6 lb

## 2017-11-14 DIAGNOSIS — I10 Essential (primary) hypertension: Secondary | ICD-10-CM

## 2017-11-14 NOTE — Patient Instructions (Signed)

## 2017-11-14 NOTE — Progress Notes (Signed)
   Subjective:    Patient ID: Paul Murillo, male    DOB: 14-May-1956, 62 y.o.   MRN: [id number]  Pt presents to the office today to recheck HTN. PT's BP is at goal today.  Hypertension  This is a chronic problem. The current episode started more than 1 year ago. The problem has been resolved since onset. The problem is controlled. Pertinent negatives include no headaches, malaise/fatigue, peripheral edema or shortness of breath. Risk factors for coronary artery disease include dyslipidemia, family history and sedentary lifestyle. Past treatments include diuretics. The current treatment provides moderate improvement. Hypertensive end-organ damage includes CAD/MI. There is no history of CVA or heart failure.      Review of Systems  Constitutional: Negative for malaise/fatigue.  Respiratory: Negative for shortness of breath.   Neurological: Negative for headaches.  All other systems reviewed and are negative.      Objective:   Physical Exam  Constitutional: He is oriented to person, place, and time. He appears well-developed and well-nourished. No distress.  HENT:  Head: Normocephalic.  Right Ear: External ear normal.  Left Ear: External ear normal.  Nose: Nose normal.  Mouth/Throat: Oropharynx is clear and moist.  Eyes: Pupils are equal, round, and reactive to light. Right eye exhibits no discharge. Left eye exhibits no discharge.  Neck: Normal range of motion. Neck supple. No thyromegaly present.  Cardiovascular: Normal rate, regular rhythm, normal heart sounds and intact distal pulses.  No murmur heard. Pulmonary/Chest: Effort normal and breath sounds normal. No respiratory distress. He has no wheezes.  Abdominal: Soft. Bowel sounds are normal. He exhibits no distension. There is no tenderness.  Musculoskeletal: Normal range of motion. He exhibits no edema or tenderness.  Neurological: He is alert and oriented to person, place, and time.  Skin: Skin is warm and dry. No rash noted.  No erythema.  Psychiatric: He has a normal mood and affect. His behavior is normal. Judgment and thought content normal.  Vitals reviewed.     BP 132/90   Pulse 95   Temp 97.7 F (36.5 C) (Oral)   Ht _0  (1.854 m)   Wt 190 lb 9.6 oz (86.5 kg)   BMI 25.15 kg/m      Assessment & Plan:  1. Essential hypertension BP at goal, continue medications  -Dash diet information given -Exercise encouraged - Stress Management  -RTO in 6 months  - BMP8+EGFR   [name], FNP

## 2017-11-15 LAB — BMP8+EGFR
BUN / CREAT RATIO: 8 — AB (ref 10–24)
BUN: 11 mg/dL (ref 8–27)
CHLORIDE: 98 mmol/L (ref 96–106)
CO2: 22 mmol/L (ref 20–29)
Calcium: 10.2 mg/dL (ref 8.6–10.2)
Creatinine, Ser: 1.33 mg/dL — ABNORMAL HIGH (ref 0.76–1.27)
GFR calc non Af Amer: 56 mL/min/{1.73_m2} — ABNORMAL LOW (ref >59)
GFR, EST AFRICAN AMERICAN: 65 mL/min/{1.73_m2} (ref >59)
GLUCOSE: 120 mg/dL — AB (ref 65–99)
Potassium: 3.9 mmol/L (ref 3.5–5.2)
Sodium: 141 mmol/L (ref 134–144)

## 2018-01-11 DIAGNOSIS — R3915 Urgency of urination: Secondary | ICD-10-CM | POA: Diagnosis not present

## 2018-01-11 DIAGNOSIS — C61 Malignant neoplasm of prostate: Secondary | ICD-10-CM | POA: Diagnosis not present

## 2018-01-11 DIAGNOSIS — R350 Frequency of micturition: Secondary | ICD-10-CM | POA: Diagnosis not present

## 2018-02-01 ENCOUNTER — Encounter: Payer: Self-pay | Admitting: *Deleted

## 2018-02-26 DIAGNOSIS — R350 Frequency of micturition: Secondary | ICD-10-CM | POA: Diagnosis not present

## 2018-02-26 DIAGNOSIS — N401 Enlarged prostate with lower urinary tract symptoms: Secondary | ICD-10-CM | POA: Diagnosis not present

## 2018-02-26 DIAGNOSIS — C61 Malignant neoplasm of prostate: Secondary | ICD-10-CM | POA: Diagnosis not present

## 2018-03-20 ENCOUNTER — Other Ambulatory Visit: Payer: Self-pay

## 2018-03-20 ENCOUNTER — Emergency Department (HOSPITAL_COMMUNITY)
Admission: EM | Admit: 2018-03-20 | Discharge: 2018-03-20 | Disposition: A | Discharge status: Home / Self Care | Payer: 59 | Attending: Emergency Medicine | Admitting: Emergency Medicine

## 2018-03-20 ENCOUNTER — Emergency Department (HOSPITAL_COMMUNITY): Payer: 59

## 2018-03-20 ENCOUNTER — Encounter (HOSPITAL_COMMUNITY): Payer: Self-pay | Admitting: Emergency Medicine

## 2018-03-20 DIAGNOSIS — Z79899 Other long term (current) drug therapy: Secondary | ICD-10-CM | POA: Diagnosis not present

## 2018-03-20 DIAGNOSIS — Z720 Tobacco use: Secondary | ICD-10-CM | POA: Diagnosis not present

## 2018-03-20 DIAGNOSIS — Z7982 Long term (current) use of aspirin: Secondary | ICD-10-CM | POA: Insufficient documentation

## 2018-03-20 DIAGNOSIS — I6789 Other cerebrovascular disease: Secondary | ICD-10-CM | POA: Diagnosis not present

## 2018-03-20 DIAGNOSIS — R55 Syncope and collapse: Secondary | ICD-10-CM

## 2018-03-20 DIAGNOSIS — I10 Essential (primary) hypertension: Secondary | ICD-10-CM | POA: Diagnosis not present

## 2018-03-20 DIAGNOSIS — R531 Weakness: Secondary | ICD-10-CM | POA: Diagnosis not present

## 2018-03-20 DIAGNOSIS — R269 Unspecified abnormalities of gait and mobility: Secondary | ICD-10-CM | POA: Diagnosis not present

## 2018-03-20 HISTORY — DX: Headache: R51

## 2018-03-20 HISTORY — DX: Headache, unspecified: R51.9

## 2018-03-20 LAB — CBC
HEMATOCRIT: 40.7 % (ref 39.0–52.0)
Hemoglobin: 13.7 g/dL (ref 13.0–17.0)
MCH: 31.1 pg (ref 26.0–34.0)
MCHC: 33.7 g/dL (ref 30.0–36.0)
MCV: 92.5 fL (ref 78.0–100.0)
PLATELETS: 243 10*3/uL (ref 150–400)
RBC: 4.4 MIL/uL (ref 4.22–5.81)
RDW: 13.5 % (ref 11.5–15.5)
WBC: 10.4 10*3/uL (ref 4.0–10.5)

## 2018-03-20 LAB — ETHANOL: Alcohol, Ethyl (B): <10 mg/dL (ref <10)

## 2018-03-20 LAB — URINALYSIS, ROUTINE W REFLEX MICROSCOPIC
Bilirubin Urine: NEGATIVE
GLUCOSE, UA: NEGATIVE mg/dL
HGB URINE DIPSTICK: NEGATIVE
KETONES UR: NEGATIVE mg/dL
LEUKOCYTES UA: NEGATIVE
Nitrite: NEGATIVE
PH: 6 (ref 5.0–8.0)
Protein, ur: NEGATIVE mg/dL
Specific Gravity, Urine: 1.014 (ref 1.005–1.030)

## 2018-03-20 LAB — BASIC METABOLIC PANEL
Anion gap: 10 (ref 5–15)
BUN: 15 mg/dL (ref 6–20)
CO2: 27 mmol/L (ref 22–32)
CREATININE: 1.4 mg/dL — AB (ref 0.61–1.24)
Calcium: 9.4 mg/dL (ref 8.9–10.3)
Chloride: 96 mmol/L — ABNORMAL LOW (ref 101–111)
GFR calc Af Amer: >60 mL/min (ref >60)
GFR calc non Af Amer: 52 mL/min — ABNORMAL LOW (ref >60)
GLUCOSE: 135 mg/dL — AB (ref 65–99)
POTASSIUM: 3.4 mmol/L — AB (ref 3.5–5.1)
Sodium: 133 mmol/L — ABNORMAL LOW (ref 135–145)

## 2018-03-20 LAB — CBG MONITORING, ED: Glucose-Capillary: 138 mg/dL — ABNORMAL HIGH (ref 65–99)

## 2018-03-20 LAB — TROPONIN I: Troponin I: <0.03 ng/mL (ref <0.03)

## 2018-03-20 MED ORDER — SODIUM CHLORIDE 0.9 % IV SOLN
1000.0000 mL | INTRAVENOUS | Status: DC
  Administered 2018-03-20: 1000 mL via INTRAVENOUS

## 2018-03-20 MED ORDER — SODIUM CHLORIDE 0.9 % IV BOLUS (SEPSIS)
1000.0000 mL | Freq: Once | INTRAVENOUS | Status: AC
  Administered 2018-03-20: 1000 mL via INTRAVENOUS

## 2018-03-20 MED ORDER — POTASSIUM CHLORIDE CRYS ER 20 MEQ PO TBCR
20.0000 meq | EXTENDED_RELEASE_TABLET | Freq: Once | ORAL | Status: AC
  Administered 2018-03-20: 20 meq via ORAL
  Filled 2018-03-20: qty 1

## 2018-03-20 NOTE — ED Triage Notes (Signed)
Patient from work via EMS. Patient had syncopal episode at work. Patient states he did not realize her passed out. States he felt hot then passed. Complains of bilateral shoulder pain.

## 2018-03-20 NOTE — Discharge Instructions (Addendum)
Your vital signs have been reviewed.  Your examination is in good range.  Lab work has been reviewed, and there is question of some very mild dehydration.  You have been given IV fluids.  Your potassium was slightly below the normal and you have been given oral potassium.  Please increase foods high in potassium.  Please increase your fluids.  Please call Dr. Harl Bowie and set up an appointment for later this week if possible concerning your passing out or syncopal episode.  Please return to the emergency department immediately if any changes in your condition, problems, or concerns.

## 2018-03-20 NOTE — ED Provider Notes (Signed)
[facility] EMERGENCY DEPARTMENT Provider Note   CSN: [id number] Arrival date & time: 03/20/18  4401     History   Chief Complaint Chief Complaint  Patient presents with  . Loss of Consciousness    HPI Paul Murillo is a 62 y.o. male.  Patient is a 62 year old male who presents to the emergency department following a syncopal episode.  The patient states that he got up approximately 4 AM which is his usual routine for work.  He felt fine.  He did not have breakfast.  He arrived at work approximately 5:30 AM.  He works as a Librarian, academic.  He stated he did not do any excessive walking or lifting or pushing or straining.  Approximately 730 he started to get very hot, he was leaning on 1 of the machines, and he says the next thing he remember was several people around him helping him to get up and to wake up.  1 of the first responders at his job said that he was probably out for about 2 to 3 minutes.  The patient states that he did not have any palpitations or chest pain prior to his syncopal episode.  He is not had any recent cold or flu type symptoms.  He is not had any nausea, vomiting, or diarrhea.  Is been no blood in his stools, no blood in his urine to be reported.  The patient states that over the last few days he has had some weakness.  He has felt tired, but no other related symptoms.  He has not had any loss of control of bowels or bladder.  And he had no loss of control of bowel or bladder during this particular episode.  There is been no unusual numbness.  He has had some cough and congestion, but no high fever to be reported.  It is of note that the patient has had a history of hypertension, myocardial infarction, percutaneous angioplasty.     Past Medical History:  Diagnosis Date  . Coronary artery disease   . ED (erectile dysfunction)   . Genitourinary disorder   . Headache   . Hypercholesteremia   . Hypertension   . MI (myocardial infarction) ([place])   . Reflux   .  Syncope   . Vitamin D deficiency     Patient Active Problem List   Diagnosis Date Noted  . Malignant neoplasm of prostate ([place]) 01/18/2016  . Esophageal reflux 08/25/2015  . CAD S/P percutaneous coronary angioplasty 02/07/2014  . Hyperlipemia 01/13/2011  . HTN (hypertension) 01/13/2011  . Smoker 01/13/2011  . Vitamin D deficiency 01/13/2011  . ED (erectile dysfunction) 01/13/2011  . SYNCOPE 12/14/2010  . CHEST PAIN 12/14/2010    Past Surgical History:  Procedure Laterality Date  . APPENDECTOMY    . HERNIA REPAIR    . LEFT HEART CATHETERIZATION WITH CORONARY ANGIOGRAM N/A 01/29/2014   Procedure: LEFT HEART CATHETERIZATION WITH CORONARY ANGIOGRAM;  Surgeon: Peter M Martinique, MD;  Location: [facility] CATH LAB;  Service: Cardiovascular;  Laterality: N/A;  . TOTAL HIP ARTHROPLASTY          Home Medications    Prior to Admission medications   Medication Sig Start Date End Date Taking? Authorizing Provider  alfuzosin (UROXATRAL) 10 MG 24 hr tablet Take 10 mg by mouth daily. 02/10/18   [provider]  amLODipine (NORVASC) 5 MG tablet Take 1 tablet (5 mg total) by mouth daily. 11/06/17   Sharion Balloon, FNP  aspirin 81 MG EC tablet  Take 81 mg by mouth daily.      [provider]  atorvastatin (LIPITOR) 40 MG tablet Take 1 tablet (40 mg total) by mouth daily. 09/13/17   [name] A, FNP  losartan-hydrochlorothiazide (HYZAAR) 100-25 MG tablet Take 1 tablet by mouth daily. 11/06/17   Sharion Balloon, FNP  nitroGLYCERIN (NITROSTAT) 0.4 MG SL tablet Place 1 tablet (0.4 mg total) under the tongue every 5 (five) minutes as needed for chest pain. Reported on 04/01/2016 11/06/17   Sharion Balloon, FNP  pantoprazole (PROTONIX) 40 MG tablet Take 1 tablet (40 mg total) by mouth daily. 09/13/17   Sharion Balloon, FNP  tamsulosin (FLOMAX) 0.4 MG CAPS capsule Take 0.4 mg by mouth daily. 01/22/18   [provider]  vardenafil (LEVITRA) 20 MG tablet Take 1 tablet (20 mg total) by  mouth daily as needed for erectile dysfunction. 03/13/17   Sharion Balloon, FNP    Family History Family History  Problem Relation Age of Onset  . Heart disease Father   . Hypertension Father   . Diabetes Brother   . Cancer Neg Hx     Social History Social History   Tobacco Use  . Smoking status: Current Some Day Smoker  . Smokeless tobacco: Never Used  Substance Use Topics  . Alcohol use: Yes    Alcohol/week: 0.0 oz    Comment: occ.  . Drug use: No     Allergies   Patient has no known allergies.   Review of Systems Review of Systems  Constitutional: Positive for fatigue. Negative for chills and fever.       All ROS Neg except as noted in HPI  HENT: Positive for congestion. Negative for nosebleeds.   Eyes: Negative for photophobia and discharge.  Respiratory: Positive for cough. Negative for shortness of breath and wheezing.   Cardiovascular: Negative for chest pain, palpitations and leg swelling.  Gastrointestinal: Negative for abdominal pain, blood in stool, diarrhea, nausea and vomiting.  Genitourinary: Negative for dysuria, frequency and hematuria.  Musculoskeletal: Negative for arthralgias, back pain and neck pain.  Skin: Negative.  Negative for rash.  Neurological: Positive for syncope, weakness and light-headedness. Negative for dizziness, seizures, speech difficulty, numbness and headaches.  Psychiatric/Behavioral: Negative for confusion and hallucinations.     Physical Exam Updated Vital Signs BP 123/80 (BP Location: Right Arm)   Pulse 77   Temp 98 F (36.7 C) (Oral)   Resp 10   Ht 6\' 1"  (1.854 m)   Wt 86.2 kg (190 lb)   SpO2 95%   BMI 25.07 kg/m   Physical Exam  Constitutional: He appears well-developed and well-nourished. No distress.  HENT:  Head: Normocephalic and atraumatic.  Right Ear: External ear normal.  Left Ear: External ear normal.  Eyes: Conjunctivae are normal. Right eye exhibits no discharge. Left eye exhibits no discharge. No  scleral icterus.  Neck: Neck supple. No tracheal deviation present.  Cardiovascular: Normal rate, regular rhythm and intact distal pulses.  Pulmonary/Chest: Effort normal and breath sounds normal. No stridor. No respiratory distress. He has no wheezes. He has no rales.  Abdominal: Soft. Bowel sounds are normal. He exhibits no distension. There is no tenderness. There is no rebound and no guarding.  Musculoskeletal: He exhibits no edema or tenderness.  Neurological: He is alert. He has normal strength. No cranial nerve deficit (no facial droop, extraocular movements intact, no slurred speech) or sensory deficit. He exhibits normal muscle tone. He displays no seizure activity. Coordination normal.  Skin: Skin is warm and dry. No rash noted.  Psychiatric: He has a normal mood and affect.  Nursing note and vitals reviewed.    ED Treatments / Results  Labs (all labs ordered are listed, but only abnormal results are displayed) Labs Reviewed  CBG MONITORING, ED - Abnormal; Notable for the following components:      Result Value   Glucose-Capillary 138 (*)    All other components within normal limits  URINALYSIS, ROUTINE W REFLEX MICROSCOPIC  BASIC METABOLIC PANEL  CBC  TROPONIN I    EKG None  Radiology No results found.  Procedures Procedures (including critical care time)  Medications Ordered in ED Medications - No data to display   Initial Impression / Assessment and Plan / ED Course  I have reviewed the triage vital signs and the nursing notes.  Pertinent labs & imaging results that were available during my care of the patient were reviewed by me and considered in my medical decision making (see chart for details).       Final Clinical Impressions(s) / ED Diagnoses MDM  Patient sustained a syncopal episode at work today.  He came into the emergency department by EMS.  Vital signs reviewed.  Pulse oximetry 98% on room air.  Electrocardiogram shows a normal sinus rhythm.   There is no STEMI present.  And there is no life-threatening or high degree block appreciated.  Patient remains awake and alert and in no distress.  The patient states that he feels a little bit weak, but otherwise he feels okay. Case discussed with Dr. Thurnell Garbe.  Capillary blood glucose is 138.  Doubt that this was related to hypoglycemia.  Urine analysis is negative for acute problem.  Complete blood count is well within normal limits.  Troponin is within normal limits.  No evidence for acute cardiac event.   Sodium is slightly low at 133, potassium is low at 3.4, chloride is low at 96, glucose is elevated at 135, the creatinine is 1.40.  This is the range that the creatinine has been running recently.  The anion gap is normal at 10.  The chest x-ray is negative for acute problem.  Case discussed with Dr. Domenic Polite in cardiology.  It is the opinion that the patient can increase his fluids for now.  And can be seen in the office this week as an outpatient follow-up.  The patient is to return to the emergency department immediately if any changes in his condition, problems, or concerns.  The patient is in agreement with this plan.   Final diagnoses:  Vasovagal syncope    ED Discharge Orders    None       Lily Kocher, PA-C 03/20/18 2031    Francine Graven, DO 03/25/18 [phone]

## 2018-03-21 DIAGNOSIS — I10 Essential (primary) hypertension: Secondary | ICD-10-CM | POA: Diagnosis not present

## 2018-03-21 DIAGNOSIS — I2510 Atherosclerotic heart disease of native coronary artery without angina pectoris: Secondary | ICD-10-CM | POA: Diagnosis not present

## 2018-03-21 DIAGNOSIS — Z955 Presence of coronary angioplasty implant and graft: Secondary | ICD-10-CM | POA: Diagnosis not present

## 2018-03-22 DIAGNOSIS — R9431 Abnormal electrocardiogram [ECG] [EKG]: Secondary | ICD-10-CM | POA: Diagnosis not present

## 2018-03-28 ENCOUNTER — Ambulatory Visit: Payer: 59 | Admitting: Family

## 2018-03-28 ENCOUNTER — Encounter: Payer: Self-pay | Admitting: Family

## 2018-03-28 VITALS — BP 113/77 | HR 87 | Temp 98.3°F | Ht 73.0 in | Wt 198.2 lb

## 2018-03-28 DIAGNOSIS — R5383 Other fatigue: Secondary | ICD-10-CM

## 2018-03-28 DIAGNOSIS — R55 Syncope and collapse: Secondary | ICD-10-CM | POA: Diagnosis not present

## 2018-03-28 DIAGNOSIS — I10 Essential (primary) hypertension: Secondary | ICD-10-CM | POA: Diagnosis not present

## 2018-03-28 DIAGNOSIS — Z09 Encounter for follow-up examination after completed treatment for conditions other than malignant neoplasm: Secondary | ICD-10-CM | POA: Diagnosis not present

## 2018-03-28 MED ORDER — LOSARTAN POTASSIUM 100 MG PO TABS
100.0000 mg | ORAL_TABLET | Freq: Every day | ORAL | 3 refills | Status: DC
Start: 2018-03-28 — End: 2019-01-03

## 2018-03-28 NOTE — Addendum Note (Signed)
Addended by: [name] A on: 03/28/2018 11:33 AM   Modules accepted: Orders, Level of Service

## 2018-03-28 NOTE — Patient Instructions (Signed)
Vasovagal Syncope, Adult  Syncope, which is commonly known as fainting or passing out, is a temporary loss of consciousness. It occurs when the blood flow to the brain is reduced. Vasovagal syncope, also called neurocardiogenic syncope, is a fainting spell that happens when blood flow to the brain is reduced because of a sudden drop in heart rate and blood pressure.  Vasovagal syncope is usually harmless. However, you can get injured if you fall during a fainting spell.  What are the causes?  This condition is caused by a drop in heart rate and blood pressure, usually in response to a trigger. Many things and situations can trigger an episode, including:  · Pain.  · Fear.  · The sight of blood. This may occur during medical procedures, such as when blood is being drawn from a vein.  · Common activities, such as coughing, swallowing, stretching, or going to the bathroom.  · Emotional stress.  · Being in a confined space.  · Prolonged standing, especially in a warm environment.  · Lack of sleep or rest.  · Not eating for a long time.  · Not drinking enough liquids.  · Recent illness.  · Drinking alcohol.  · Taking drugs that affect blood pressure, such as marijuana, cocaine, opiates, or inhalants.    What are the signs or symptoms?  Before a fainting episode, you may:  · Feel dizzy or light-headed.  · Become pale.  · Sense that you are going to faint.  · Feel like the room is spinning.  · Only see directly ahead (tunnel vision).  · Feel sick to your stomach (nauseous).  · See spots.  · Slowly lose vision.  · Hear ringing in your ears.  · Have a headache.  · Feel warm and sweaty.  · Feel a sensation of pins and needles.    During the fainting spell, you may twitch or make jerky movements. Fainting spells usually last no longer than a few minutes before you wake up. If you get up too quickly before your body can recover, you may faint again.  How is this diagnosed?  This condition is diagnosed based on your symptoms,  your medical history, and a physical exam. Tests may be done to rule out other causes of fainting. Tests may include:  · Blood tests.  · Heart tests, such as an electrocardiogram (ECG), echocardiogram, or electrophysiology study.  · A test to check your response to changes in position (tilt table test).    How is this treated?  Usually, treatment is not needed for this condition. Your health care provider may suggest ways to help prevent fainting episodes. These may include:  · Drinking additional fluids if you are exposed to a trigger.  · Sitting or lying down if you notice signs that an episode is coming.    If your fainting spells continue, your health care provider may recommend that you:  · Take medicines to prevent fainting or to help reduce further episodes of fainting.  · Do certain exercises.  · Wear compression stockings.  · Have surgery to place a pacemaker in your body (rare).    Follow these instructions at home:  · Learn to identify the signs that an episode is coming.  · Sit or lie down at the first sign of a fainting spell. If you sit down, put your head down between your legs. If you lie down, swing your legs up in the air to increase blood flow to the brain.  ·   Avoid hot tubs and saunas.  · Avoid standing for a long time. If you have to stand for a long time, try:  ? Crossing your legs.  ? Flexing and stretching your leg muscles.  ? Squatting.  ? Moving your legs.  ? Bending over.  · Drink enough fluid to keep your urine clear or pale yellow.  · Make changes to your diet that your health care provider recommends. You may be told to:  ? Avoid caffeine.  ? Eat more salt.  · Take over-the-counter and prescription medicines only as told by your health care provider.  Contact a health care provider if:  · You continue to have fainting spells despite treatment.  · You faint more often despite treatment.  · You lose consciousness for more than a few minutes.  · You faint during or after exercising or  after being startled.  · You have twitching or jerky movements for longer than a few seconds during a fainting spell.  · You have an episode of twitching or jerky movements without fainting.  Get help right away if:  · A fainting spell leads to an injury or bleeding.  · You have new symptoms that occur with the fainting spells, such as:  ? Shortness of breath.  ? Chest pain.  ? Irregular heartbeat.  · You twitch or make jerky movements for more than 5 minutes.  · You twitch or make jerky movements during more than one fainting spell.  This information is not intended to replace advice given to you by your health care provider. Make sure you discuss any questions you have with your health care provider.  Document Released: 09/26/2012 Document Revised: 03/23/2016 Document Reviewed: 08/08/2015  Elsevier Interactive Patient Education © 2018 [employer]

## 2018-03-28 NOTE — Progress Notes (Addendum)
   Subjective:    Patient ID: Paul Murillo, male    DOB: 03/01/1956, 62 y.o.   MRN: [id number]  Chief Complaint  Patient presents with  . Hospitalization Follow-up    passing out    HPI PT presents to the office today for hospital follow up. States he was standing at work and "passed out". PT went to the ED on 03/20/18  Pt had normal EKG and chest x-ray, urine negative and diagnosed with vasovagal syncope.   Pt states he is feeling better and denies any SOB, chest pain, or dizziness. Pt has since seen his Cardiologists who said his heart was "good". States that he might could stop his HCTZ.   States he is having fatigue and tiredness.   *Hosptial notes reviewed  Review of Systems  All other systems reviewed and are negative.      Objective:   Physical Exam  Constitutional: He is oriented to person, place, and time. He appears well-developed and well-nourished. No distress.  HENT:  Head: Normocephalic.  Right Ear: External ear normal.  Left Ear: External ear normal.  Mouth/Throat: Oropharynx is clear and moist.  Eyes: Pupils are equal, round, and reactive to light. Right eye exhibits no discharge. Left eye exhibits no discharge.  Neck: Normal range of motion. Neck supple. No thyromegaly present.  Cardiovascular: Normal rate, regular rhythm, normal heart sounds and intact distal pulses.  No murmur heard. Pulmonary/Chest: Effort normal and breath sounds normal. No respiratory distress. He has no wheezes.  Abdominal: Soft. Bowel sounds are normal. He exhibits no distension. There is no tenderness.  Musculoskeletal: Normal range of motion. He exhibits no edema or tenderness.  Neurological: He is alert and oriented to person, place, and time. He has normal reflexes. No cranial nerve deficit.  Skin: Skin is warm and dry. No rash noted. No erythema.  Psychiatric: He has a normal mood and affect. His behavior is normal. Judgment and thought content normal.  Vitals  reviewed.    BP 113/77   Pulse 87   Temp 98.3 F (36.8 C) (Oral)   Ht '6\' 1"'$  (1.854 m)   Wt 198 lb 3.2 oz (89.9 kg)   BMI 26.15 kg/m      Assessment & Plan:  Tania Perrott comes in today with chief complaint of Hospitalization Follow-up (passing out)   Diagnosis and orders addressed:  1. Vasovagal syncope - BMP8+EGFR  2. Hospital discharge follow-up - BMP8+EGFR  3. Essential hypertension Will stop HCTZ today -Exercise encouraged - Stress Management  -Continue current meds - BMP8+EGFR - losartan (COZAAR) 100 MG tablet; Take 1 tablet (100 mg total) by mouth daily.  Dispense: 90 tablet; Refill: 3  4. Other fatigue - TSH   Follow up plan: 2 weeks to recheck B/P after stopping HCTZ   [name], FNP

## 2018-03-29 ENCOUNTER — Other Ambulatory Visit: Payer: Self-pay | Admitting: Family

## 2018-03-29 LAB — BMP8+EGFR
BUN/Creatinine Ratio: 13 (ref 10–24)
BUN: 16 mg/dL (ref 8–27)
CALCIUM: 9.5 mg/dL (ref 8.6–10.2)
CO2: 26 mmol/L (ref 20–29)
CREATININE: 1.27 mg/dL (ref 0.76–1.27)
Chloride: 95 mmol/L — ABNORMAL LOW (ref 96–106)
GFR calc Af Amer: 69 mL/min/{1.73_m2} (ref >59)
GFR calc non Af Amer: 60 mL/min/{1.73_m2} (ref >59)
GLUCOSE: 116 mg/dL — AB (ref 65–99)
Potassium: 3.4 mmol/L — ABNORMAL LOW (ref 3.5–5.2)
SODIUM: 135 mmol/L (ref 134–144)

## 2018-03-29 LAB — TSH: TSH: 2.75 u[IU]/mL (ref 0.450–4.500)

## 2018-04-07 ENCOUNTER — Other Ambulatory Visit: Payer: Self-pay | Admitting: Family

## 2018-04-07 DIAGNOSIS — N529 Male erectile dysfunction, unspecified: Secondary | ICD-10-CM

## 2018-04-09 NOTE — Telephone Encounter (Signed)
Last seen 03/28/18  Paul Murillo 

## 2018-04-16 ENCOUNTER — Ambulatory Visit: Payer: 59 | Admitting: Family

## 2018-04-16 ENCOUNTER — Encounter: Payer: Self-pay | Admitting: Family

## 2018-04-16 VITALS — BP 129/87 | HR 83 | Temp 97.7°F | Ht 73.0 in | Wt 197.0 lb

## 2018-04-16 DIAGNOSIS — I10 Essential (primary) hypertension: Secondary | ICD-10-CM

## 2018-04-16 LAB — BMP8+EGFR
BUN / CREAT RATIO: 7 — AB (ref 10–24)
BUN: 9 mg/dL (ref 8–27)
CO2: 21 mmol/L (ref 20–29)
CREATININE: 1.34 mg/dL — AB (ref 0.76–1.27)
Calcium: 9.7 mg/dL (ref 8.6–10.2)
Chloride: 103 mmol/L (ref 96–106)
GFR calc Af Amer: 65 mL/min/{1.73_m2} (ref >59)
GFR, EST NON AFRICAN AMERICAN: 56 mL/min/{1.73_m2} — AB (ref >59)
Glucose: 122 mg/dL — ABNORMAL HIGH (ref 65–99)
Potassium: 3.9 mmol/L (ref 3.5–5.2)
SODIUM: 139 mmol/L (ref 134–144)

## 2018-04-16 NOTE — Patient Instructions (Signed)

## 2018-04-16 NOTE — Progress Notes (Signed)
   Subjective:    Patient ID: Paul Murillo, male    DOB: [birth year], 62 y.o.   MRN: [id number]  Chief Complaint  Patient presents with  . Hypertension    two week recheck   PT presents to the office today to recheck HTN after stopping HCTZ. Pt's BP is at goal.  Hypertension  This is a chronic problem. The current episode started more than 1 year ago. The problem has been resolved since onset. The problem is controlled. Pertinent negatives include no headaches, malaise/fatigue, peripheral edema or shortness of breath. Past treatments include angiotensin blockers. The current treatment provides moderate improvement.      Review of Systems  Constitutional: Negative for malaise/fatigue.  Respiratory: Negative for shortness of breath.   Neurological: Negative for headaches.  All other systems reviewed and are negative.      Objective:   Physical Exam  Constitutional: He is oriented to person, place, and time. He appears well-developed and well-nourished. No distress.  HENT:  Head: Normocephalic.  Right Ear: External ear normal.  Left Ear: External ear normal.  Mouth/Throat: Oropharynx is clear and moist.  Eyes: Pupils are equal, round, and reactive to light. Right eye exhibits no discharge. Left eye exhibits no discharge.  Neck: Normal range of motion. Neck supple. No thyromegaly present.  Cardiovascular: Normal rate, regular rhythm, normal heart sounds and intact distal pulses.  No murmur heard. Pulmonary/Chest: Effort normal and breath sounds normal. No respiratory distress. He has no wheezes.  Abdominal: Soft. Bowel sounds are normal. He exhibits no distension. There is no tenderness.  Musculoskeletal: Normal range of motion. He exhibits no edema or tenderness.  Neurological: He is alert and oriented to person, place, and time. He has normal reflexes. No cranial nerve deficit.  Skin: Skin is warm and dry. No rash noted. No erythema.  Psychiatric: He has a normal mood and affect.  His behavior is normal. Judgment and thought content normal.  Vitals reviewed.     BP 129/87   Pulse 83   Temp 97.7 F (36.5 C) (Oral)   Ht '6\' 1"'$  (1.854 m)   Wt 197 lb (89.4 kg)   BMI 25.99 kg/m      Assessment & Plan:  [name] comes in today with chief complaint of Hypertension (two week recheck)   Diagnosis and orders addressed:  1. Essential hypertension -Dash diet information given -Exercise encouraged - Stress Management  -Continue current meds -RTO in 6 months  - BMP8+EGFR   [name], FNP

## 2018-05-14 ENCOUNTER — Ambulatory Visit: Payer: 59 | Admitting: Family

## 2018-05-14 ENCOUNTER — Encounter: Payer: Self-pay | Admitting: Family

## 2018-05-14 VITALS — BP 131/81 | HR 98 | Temp 98.3°F | Ht 73.0 in | Wt 195.6 lb

## 2018-05-14 DIAGNOSIS — F17200 Nicotine dependence, unspecified, uncomplicated: Secondary | ICD-10-CM

## 2018-05-14 DIAGNOSIS — K219 Gastro-esophageal reflux disease without esophagitis: Secondary | ICD-10-CM

## 2018-05-14 DIAGNOSIS — I10 Essential (primary) hypertension: Secondary | ICD-10-CM

## 2018-05-14 DIAGNOSIS — Z0000 Encounter for general adult medical examination without abnormal findings: Secondary | ICD-10-CM

## 2018-05-14 DIAGNOSIS — Z9861 Coronary angioplasty status: Secondary | ICD-10-CM

## 2018-05-14 DIAGNOSIS — E559 Vitamin D deficiency, unspecified: Secondary | ICD-10-CM

## 2018-05-14 DIAGNOSIS — I2510 Atherosclerotic heart disease of native coronary artery without angina pectoris: Secondary | ICD-10-CM | POA: Diagnosis not present

## 2018-05-14 DIAGNOSIS — E782 Mixed hyperlipidemia: Secondary | ICD-10-CM

## 2018-05-14 DIAGNOSIS — N401 Enlarged prostate with lower urinary tract symptoms: Secondary | ICD-10-CM

## 2018-05-14 DIAGNOSIS — R351 Nocturia: Secondary | ICD-10-CM

## 2018-05-14 DIAGNOSIS — N400 Enlarged prostate without lower urinary tract symptoms: Secondary | ICD-10-CM | POA: Insufficient documentation

## 2018-05-14 MED ORDER — TAMSULOSIN HCL 0.4 MG PO CAPS: 0.4000 mg | ORAL_CAPSULE | Freq: Every day | ORAL | 3 refills | Status: DC

## 2018-05-14 NOTE — Progress Notes (Signed)
Subjective:    Patient ID: Paul Murillo, male    DOB: [birth year], 62 y.o.   MRN: [id number]  Chief Complaint  Patient presents with  . Medical Management of Chronic Issues    six month recheck   Pt presents to the office today for CPE. Pt is followed by Urologists every 6 months for Prostate Cancer. PT states he is followed by Cardiologists annually for CAD.   Hypertension  This is a chronic problem. The current episode started more than 1 year ago. The problem has been resolved since onset. The problem is controlled. Pertinent negatives include no malaise/fatigue, peripheral edema or shortness of breath. Risk factors for coronary artery disease include dyslipidemia, male gender, smoking/tobacco exposure and sedentary lifestyle. The current treatment provides moderate improvement. Hypertensive end-organ damage includes CAD/MI.  Gastroesophageal Reflux  He reports no belching, no coughing or no heartburn. This is a chronic problem. The current episode started more than 1 year ago. The problem occurs occasionally. The problem has been waxing and waning. He has tried a PPI for the symptoms. The treatment provided moderate relief.  Hyperlipidemia  This is a chronic problem. The current episode started more than 1 year ago. The problem is controlled. Recent lipid tests were reviewed and are normal. Pertinent negatives include no shortness of breath. Current antihyperlipidemic treatment includes statins. The current treatment provides moderate improvement of lipids. Risk factors for coronary artery disease include dyslipidemia, male sex, hypertension and a sedentary lifestyle.  Benign Prostatic Hypertrophy  This is a chronic problem. The current episode started more than 1 year ago. The problem has been waxing and waning since onset. Irritative symptoms include nocturia and urgency. Associated symptoms include hesitancy.      Review of Systems  Constitutional: Negative for malaise/fatigue.    Respiratory: Negative for cough and shortness of breath.   Gastrointestinal: Negative for heartburn.  Genitourinary: Positive for hesitancy, nocturia and urgency.  All other systems reviewed and are negative.      Objective:   Physical Exam  Constitutional: He is oriented to person, place, and time. He appears well-developed and well-nourished. No distress.  HENT:  Head: Normocephalic.  Right Ear: External ear normal.  Left Ear: External ear normal.  Mouth/Throat: Oropharynx is clear and moist.  Eyes: Pupils are equal, round, and reactive to light. Right eye exhibits no discharge. Left eye exhibits no discharge.  Neck: Normal range of motion. Neck supple. No thyromegaly present.  Cardiovascular: Normal rate, regular rhythm, normal heart sounds and intact distal pulses.  No murmur heard. Pulmonary/Chest: Effort normal and breath sounds normal. No respiratory distress. He has no wheezes.  Abdominal: Soft. Bowel sounds are normal. He exhibits no distension. There is no tenderness.  Musculoskeletal: Normal range of motion. He exhibits no edema or tenderness.  Neurological: He is alert and oriented to person, place, and time. He has normal reflexes. No cranial nerve deficit.  Skin: Skin is warm and dry. No rash noted. No erythema.  Psychiatric: He has a normal mood and affect. His behavior is normal. Judgment and thought content normal.  Vitals reviewed.     BP 131/81   Pulse 98   Temp 98.3 F (36.8 C) (Oral)   Ht 6' 1"  (1.854 m)   Wt 195 lb 9.6 oz (88.7 kg)   BMI 25.81 kg/m      Assessment & Plan:  Paul Murillo comes in today with chief complaint of Medical Management of Chronic Issues (six month recheck)   Diagnosis and orders  addressed:  1. CAD S/P percutaneous coronary angioplasty - CMP14+EGFR - CBC with Differential/Platelet  2. Essential hypertension  - CMP14+EGFR - CBC with Differential/Platelet  3. Mixed hyperlipidemia - CMP14+EGFR - CBC with  Differential/Platelet - Lipid panel  4. Smoker - CMP14+EGFR - CBC with Differential/Platelet  5. Vitamin D deficiency  - CMP14+EGFR - CBC with Differential/Platelet - VITAMIN D 25 Hydroxy (Vit-D Deficiency, Fractures)  6. Gastroesophageal reflux disease, esophagitis presence not specified  - CMP14+EGFR - CBC with Differential/Platelet  7. Benign prostatic hyperplasia with nocturia - CMP14+EGFR - CBC with Differential/Platelet  8. Annual physical exam  - CMP14+EGFR - CBC with Differential/Platelet - Lipid panel - VITAMIN D 25 Hydroxy (Vit-D Deficiency, Fractures) - TSH   Labs pending Health Maintenance reviewed Diet and exercise encouraged  Follow up plan: 6 months    [name], FNP

## 2018-05-14 NOTE — Patient Instructions (Signed)

## 2018-05-15 ENCOUNTER — Other Ambulatory Visit: Payer: Self-pay | Admitting: Family

## 2018-05-15 LAB — CMP14+EGFR
A/G RATIO: 2.3 — AB (ref 1.2–2.2)
ALT: 14 IU/L (ref 0–44)
AST: 21 IU/L (ref 0–40)
Albumin: 4.8 g/dL (ref 3.6–4.8)
Alkaline Phosphatase: 66 IU/L (ref 39–117)
BUN/Creatinine Ratio: 7 — ABNORMAL LOW (ref 10–24)
BUN: 9 mg/dL (ref 8–27)
Bilirubin Total: 0.6 mg/dL (ref 0.0–1.2)
CALCIUM: 9.6 mg/dL (ref 8.6–10.2)
CHLORIDE: 100 mmol/L (ref 96–106)
CO2: 21 mmol/L (ref 20–29)
Creatinine, Ser: 1.38 mg/dL — ABNORMAL HIGH (ref 0.76–1.27)
GFR, EST AFRICAN AMERICAN: 62 mL/min/{1.73_m2} (ref >59)
GFR, EST NON AFRICAN AMERICAN: 54 mL/min/{1.73_m2} — AB (ref >59)
Globulin, Total: 2.1 g/dL (ref 1.5–4.5)
Glucose: 127 mg/dL — ABNORMAL HIGH (ref 65–99)
POTASSIUM: 3.7 mmol/L (ref 3.5–5.2)
SODIUM: 138 mmol/L (ref 134–144)
TOTAL PROTEIN: 6.9 g/dL (ref 6.0–8.5)

## 2018-05-15 LAB — CBC WITH DIFFERENTIAL/PLATELET
BASOS ABS: 0 10*3/uL (ref 0.0–0.2)
BASOS: 0 %
EOS (ABSOLUTE): 0.1 10*3/uL (ref 0.0–0.4)
Eos: 2 %
Hematocrit: 38.9 % (ref 37.5–51.0)
Hemoglobin: 13.1 g/dL (ref 13.0–17.7)
Immature Grans (Abs): 0 10*3/uL (ref 0.0–0.1)
Immature Granulocytes: 0 %
Lymphocytes Absolute: 2.8 10*3/uL (ref 0.7–3.1)
Lymphs: 37 %
MCH: 31 pg (ref 26.6–33.0)
MCHC: 33.7 g/dL (ref 31.5–35.7)
MCV: 92 fL (ref 79–97)
MONOS ABS: 0.7 10*3/uL (ref 0.1–0.9)
Monocytes: 9 %
NEUTROS ABS: 4 10*3/uL (ref 1.4–7.0)
Neutrophils: 52 %
PLATELETS: 280 10*3/uL (ref 150–450)
RBC: 4.23 x10E6/uL (ref 4.14–5.80)
RDW: 14.5 % (ref 12.3–15.4)
WBC: 7.7 10*3/uL (ref 3.4–10.8)

## 2018-05-15 LAB — LIPID PANEL
Chol/HDL Ratio: 2.8 ratio (ref 0.0–5.0)
Cholesterol, Total: 147 mg/dL (ref 100–199)
HDL: 52 mg/dL (ref >39)
LDL Calculated: 74 mg/dL (ref 0–99)
Triglycerides: 104 mg/dL (ref 0–149)
VLDL Cholesterol Cal: 21 mg/dL (ref 5–40)

## 2018-05-15 LAB — VITAMIN D 25 HYDROXY (VIT D DEFICIENCY, FRACTURES): Vit D, 25-Hydroxy: 15.8 ng/mL — ABNORMAL LOW (ref 30.0–100.0)

## 2018-05-15 LAB — TSH: TSH: 3.5 u[IU]/mL (ref 0.450–4.500)

## 2018-05-15 MED ORDER — VITAMIN D (ERGOCALCIFEROL) 1.25 MG (50000 UNIT) PO CAPS: 50000.0000 [IU] | ORAL_CAPSULE | ORAL | 3 refills | Status: DC

## 2018-05-23 ENCOUNTER — Other Ambulatory Visit: Payer: Self-pay | Admitting: Family

## 2018-05-23 DIAGNOSIS — E782 Mixed hyperlipidemia: Secondary | ICD-10-CM

## 2018-06-12 ENCOUNTER — Other Ambulatory Visit: Payer: Self-pay | Admitting: Family

## 2018-06-12 DIAGNOSIS — K219 Gastro-esophageal reflux disease without esophagitis: Secondary | ICD-10-CM

## 2018-06-28 DIAGNOSIS — N401 Enlarged prostate with lower urinary tract symptoms: Secondary | ICD-10-CM | POA: Diagnosis not present

## 2018-06-28 DIAGNOSIS — R350 Frequency of micturition: Secondary | ICD-10-CM | POA: Diagnosis not present

## 2018-07-05 DIAGNOSIS — N401 Enlarged prostate with lower urinary tract symptoms: Secondary | ICD-10-CM | POA: Diagnosis not present

## 2018-07-05 DIAGNOSIS — R351 Nocturia: Secondary | ICD-10-CM | POA: Diagnosis not present

## 2018-07-05 DIAGNOSIS — C61 Malignant neoplasm of prostate: Secondary | ICD-10-CM | POA: Diagnosis not present

## 2018-08-10 DIAGNOSIS — Z23 Encounter for immunization: Secondary | ICD-10-CM | POA: Diagnosis not present

## 2018-09-06 DIAGNOSIS — R3915 Urgency of urination: Secondary | ICD-10-CM | POA: Diagnosis not present

## 2018-09-06 DIAGNOSIS — R350 Frequency of micturition: Secondary | ICD-10-CM | POA: Diagnosis not present

## 2018-09-06 DIAGNOSIS — C61 Malignant neoplasm of prostate: Secondary | ICD-10-CM | POA: Diagnosis not present

## 2018-10-18 ENCOUNTER — Ambulatory Visit: Payer: 59 | Admitting: Family

## 2018-10-29 ENCOUNTER — Other Ambulatory Visit: Payer: Self-pay | Admitting: Family

## 2018-10-29 DIAGNOSIS — I10 Essential (primary) hypertension: Secondary | ICD-10-CM

## 2018-10-29 NOTE — Telephone Encounter (Signed)
Ov 11/15/18 

## 2018-11-15 ENCOUNTER — Ambulatory Visit (INDEPENDENT_AMBULATORY_CARE_PROVIDER_SITE_OTHER): Payer: 59 | Admitting: Family

## 2018-11-15 ENCOUNTER — Encounter: Payer: Self-pay | Admitting: Family

## 2018-11-15 VITALS — BP 131/86 | HR 96 | Temp 97.4°F | Ht 73.0 in | Wt 193.4 lb

## 2018-11-15 DIAGNOSIS — F17200 Nicotine dependence, unspecified, uncomplicated: Secondary | ICD-10-CM | POA: Diagnosis not present

## 2018-11-15 DIAGNOSIS — C61 Malignant neoplasm of prostate: Secondary | ICD-10-CM

## 2018-11-15 DIAGNOSIS — I10 Essential (primary) hypertension: Secondary | ICD-10-CM | POA: Diagnosis not present

## 2018-11-15 DIAGNOSIS — N401 Enlarged prostate with lower urinary tract symptoms: Secondary | ICD-10-CM

## 2018-11-15 DIAGNOSIS — R351 Nocturia: Secondary | ICD-10-CM | POA: Diagnosis not present

## 2018-11-15 DIAGNOSIS — E559 Vitamin D deficiency, unspecified: Secondary | ICD-10-CM

## 2018-11-15 DIAGNOSIS — I2510 Atherosclerotic heart disease of native coronary artery without angina pectoris: Secondary | ICD-10-CM | POA: Diagnosis not present

## 2018-11-15 DIAGNOSIS — E782 Mixed hyperlipidemia: Secondary | ICD-10-CM

## 2018-11-15 DIAGNOSIS — Z9861 Coronary angioplasty status: Secondary | ICD-10-CM

## 2018-11-15 NOTE — Patient Instructions (Signed)

## 2018-11-15 NOTE — Progress Notes (Signed)
Subjective:    Patient ID: Paul Murillo, male    DOB: June 06, 1956, 63 y.o.   MRN: [id number]  Chief Complaint  Patient presents with  . Medical Management of Chronic Issues    six month recheck   Pt presents to the office today for chronic follow up. Pt is followed by Urologists every 6 months for Prostate Cancer.PT states he is followed by Cardiologists annually for CAD. Hypertension  This is a chronic problem. The current episode started more than 1 year ago. The problem has been resolved since onset. The problem is controlled. Associated symptoms include malaise/fatigue. Pertinent negatives include no headaches, peripheral edema or shortness of breath. The current treatment provides moderate improvement. Hypertensive end-organ damage includes CAD/MI.  Benign Prostatic Hypertrophy  This is a chronic problem. The current episode started more than 1 year ago. The problem has been waxing and waning since onset. Irritative symptoms include nocturia (1).  Hyperlipidemia  This is a chronic problem. The current episode started more than 1 year ago. The problem is controlled. Recent lipid tests were reviewed and are normal. Exacerbating diseases include obesity. Pertinent negatives include no shortness of breath. Current antihyperlipidemic treatment includes statins. The current treatment provides moderate improvement of lipids. Risk factors for coronary artery disease include dyslipidemia, male sex, hypertension and a sedentary lifestyle.  Gastroesophageal Reflux  He complains of heartburn. He reports no belching or no coughing. This is a chronic problem. The current episode started more than 1 year ago. The problem occurs occasionally. The symptoms are aggravated by smoking. He has tried a PPI for the symptoms. The treatment provided moderate relief.      Review of Systems  Constitutional: Positive for malaise/fatigue.  Respiratory: Negative for cough and shortness of breath.     Gastrointestinal: Positive for heartburn.  Genitourinary: Positive for nocturia (1).  Neurological: Negative for headaches.  All other systems reviewed and are negative.      Objective:   Physical Exam Vitals signs reviewed.  Constitutional:      General: He is not in acute distress.    Appearance: He is well-developed.  HENT:     Head: Normocephalic.     Right Ear: External ear normal.     Left Ear: External ear normal.  Eyes:     General:        Right eye: No discharge.        Left eye: No discharge.     Pupils: Pupils are equal, round, and reactive to light.  Neck:     Musculoskeletal: Normal range of motion and neck supple.     Thyroid: No thyromegaly.  Cardiovascular:     Rate and Rhythm: Normal rate and regular rhythm.     Heart sounds: Normal heart sounds. No murmur.  Pulmonary:     Effort: Pulmonary effort is normal. No respiratory distress.     Breath sounds: Normal breath sounds. No wheezing.  Abdominal:     General: Bowel sounds are normal. There is no distension.     Palpations: Abdomen is soft.     Tenderness: There is no abdominal tenderness.  Musculoskeletal: Normal range of motion.        General: No tenderness.  Skin:    General: Skin is warm and dry.     Findings: No erythema or rash.  Neurological:     Mental Status: He is alert and oriented to person, place, and time.     Cranial Nerves: No cranial nerve deficit.  Deep Tendon Reflexes: Reflexes are normal and symmetric.  Psychiatric:        Behavior: Behavior normal.        Thought Content: Thought content normal.        Judgment: Judgment normal.       BP 131/86   Pulse 96   Temp (!) 97.4 F (36.3 C) (Oral)   Ht 6' 1"  (1.854 m)   Wt 193 lb 6.4 oz (87.7 kg)   BMI 25.52 kg/m      Assessment & Plan:  Chip Canepa comes in today with chief complaint of Medical Management of Chronic Issues (six month recheck)   Diagnosis and orders addressed:  1. Essential hypertension -  CMP14+EGFR - CBC with Differential/Platelet  2. Benign prostatic hyperplasia with nocturia - CMP14+EGFR - CBC with Differential/Platelet  3. Mixed hyperlipidemia - CMP14+EGFR - CBC with Differential/Platelet - Lipid panel  4. Smoker - CMP14+EGFR - CBC with Differential/Platelet  5. Vitamin D deficiency - CMP14+EGFR - CBC with Differential/Platelet  6. Malignant neoplasm of prostate ([facility]) - CMP14+EGFR - CBC with Differential/Platelet  7. CAD S/P percutaneous coronary angioplasty - CMP14+EGFR - CBC with Differential/Platelet   Labs pending Health Maintenance reviewed Diet and exercise encouraged  Follow up plan: 6 months    [name], FNP

## 2018-11-16 ENCOUNTER — Other Ambulatory Visit: Payer: Self-pay | Admitting: Family

## 2018-11-16 LAB — CMP14+EGFR
ALT: 19 IU/L (ref 0–44)
AST: 17 IU/L (ref 0–40)
Albumin/Globulin Ratio: 1.9 (ref 1.2–2.2)
Albumin: 4.6 g/dL (ref 3.8–4.8)
Alkaline Phosphatase: 70 IU/L (ref 39–117)
BUN/Creatinine Ratio: 8 — ABNORMAL LOW (ref 10–24)
BUN: 11 mg/dL (ref 8–27)
Bilirubin Total: 0.4 mg/dL (ref 0.0–1.2)
CO2: 22 mmol/L (ref 20–29)
Calcium: 9.8 mg/dL (ref 8.6–10.2)
Chloride: 100 mmol/L (ref 96–106)
Creatinine, Ser: 1.37 mg/dL — ABNORMAL HIGH (ref 0.76–1.27)
GFR calc Af Amer: 63 mL/min/{1.73_m2} (ref >59)
GFR calc non Af Amer: 54 mL/min/{1.73_m2} — ABNORMAL LOW (ref >59)
GLOBULIN, TOTAL: 2.4 g/dL (ref 1.5–4.5)
Glucose: 114 mg/dL — ABNORMAL HIGH (ref 65–99)
Potassium: 4.1 mmol/L (ref 3.5–5.2)
SODIUM: 140 mmol/L (ref 134–144)
Total Protein: 7 g/dL (ref 6.0–8.5)

## 2018-11-16 LAB — CBC WITH DIFFERENTIAL/PLATELET
Basophils Absolute: 0 10*3/uL (ref 0.0–0.2)
Basos: 1 %
EOS (ABSOLUTE): 0.2 10*3/uL (ref 0.0–0.4)
Eos: 2 %
Hematocrit: 39.7 % (ref 37.5–51.0)
Hemoglobin: 13.8 g/dL (ref 13.0–17.7)
Immature Grans (Abs): 0 10*3/uL (ref 0.0–0.1)
Immature Granulocytes: 0 %
Lymphocytes Absolute: 2.3 10*3/uL (ref 0.7–3.1)
Lymphs: 27 %
MCH: 31.4 pg (ref 26.6–33.0)
MCHC: 34.8 g/dL (ref 31.5–35.7)
MCV: 90 fL (ref 79–97)
Monocytes Absolute: 0.6 10*3/uL (ref 0.1–0.9)
Monocytes: 7 %
Neutrophils Absolute: 5.5 10*3/uL (ref 1.4–7.0)
Neutrophils: 63 %
Platelets: 261 10*3/uL (ref 150–450)
RBC: 4.39 x10E6/uL (ref 4.14–5.80)
RDW: 13.3 % (ref 11.6–15.4)
WBC: 8.6 10*3/uL (ref 3.4–10.8)

## 2018-11-16 LAB — LIPID PANEL
Chol/HDL Ratio: 2.6 ratio (ref 0.0–5.0)
Cholesterol, Total: 150 mg/dL (ref 100–199)
HDL: 58 mg/dL (ref >39)
LDL Calculated: 76 mg/dL (ref 0–99)
Triglycerides: 81 mg/dL (ref 0–149)
VLDL Cholesterol Cal: 16 mg/dL (ref 5–40)

## 2018-11-26 ENCOUNTER — Other Ambulatory Visit: Payer: Self-pay | Admitting: Family

## 2018-11-26 DIAGNOSIS — E782 Mixed hyperlipidemia: Secondary | ICD-10-CM

## 2018-12-30 ENCOUNTER — Other Ambulatory Visit: Payer: Self-pay | Admitting: Family

## 2018-12-30 DIAGNOSIS — K219 Gastro-esophageal reflux disease without esophagitis: Secondary | ICD-10-CM

## 2019-01-03 ENCOUNTER — Other Ambulatory Visit: Payer: Self-pay | Admitting: Family

## 2019-01-03 DIAGNOSIS — I10 Essential (primary) hypertension: Secondary | ICD-10-CM

## 2019-01-03 DIAGNOSIS — C61 Malignant neoplasm of prostate: Secondary | ICD-10-CM | POA: Diagnosis not present

## 2019-01-03 NOTE — Telephone Encounter (Signed)
Losartan is on back order and pharmacy is requesting we change to valsartan. Please review and advise

## 2019-01-03 NOTE — Telephone Encounter (Signed)
Please let patient know about the change

## 2019-01-10 DIAGNOSIS — C61 Malignant neoplasm of prostate: Secondary | ICD-10-CM | POA: Diagnosis not present

## 2019-01-10 DIAGNOSIS — R350 Frequency of micturition: Secondary | ICD-10-CM | POA: Diagnosis not present

## 2019-01-10 DIAGNOSIS — N401 Enlarged prostate with lower urinary tract symptoms: Secondary | ICD-10-CM | POA: Diagnosis not present

## 2019-01-27 ENCOUNTER — Other Ambulatory Visit: Payer: Self-pay | Admitting: Family

## 2019-01-27 DIAGNOSIS — I10 Essential (primary) hypertension: Secondary | ICD-10-CM

## 2019-01-30 ENCOUNTER — Other Ambulatory Visit: Payer: Self-pay

## 2019-01-30 ENCOUNTER — Encounter: Payer: Self-pay | Admitting: Family

## 2019-01-30 ENCOUNTER — Ambulatory Visit (INDEPENDENT_AMBULATORY_CARE_PROVIDER_SITE_OTHER): Payer: 59 | Admitting: Family

## 2019-01-30 DIAGNOSIS — Z9861 Coronary angioplasty status: Secondary | ICD-10-CM

## 2019-01-30 DIAGNOSIS — E782 Mixed hyperlipidemia: Secondary | ICD-10-CM

## 2019-01-30 DIAGNOSIS — I2510 Atherosclerotic heart disease of native coronary artery without angina pectoris: Secondary | ICD-10-CM

## 2019-01-30 DIAGNOSIS — R351 Nocturia: Secondary | ICD-10-CM

## 2019-01-30 DIAGNOSIS — N401 Enlarged prostate with lower urinary tract symptoms: Secondary | ICD-10-CM | POA: Diagnosis not present

## 2019-01-30 DIAGNOSIS — I10 Essential (primary) hypertension: Secondary | ICD-10-CM | POA: Diagnosis not present

## 2019-01-30 DIAGNOSIS — F17200 Nicotine dependence, unspecified, uncomplicated: Secondary | ICD-10-CM

## 2019-01-30 MED ORDER — TELMISARTAN 80 MG PO TABS: 80.0000 mg | ORAL_TABLET | Freq: Every day | ORAL | 1 refills | Status: DC

## 2019-01-30 NOTE — Progress Notes (Signed)
Virtual Visit via telephone Note  I connected with Paul Murillo on 01/30/19 at 3:34 pm by telephone and verified that I am speaking with the correct person using two identifiers. Paul Murillo is currently located at home and no one is currently with her during visit. The provider, [name], FNP is located in their office at time of visit.  I discussed the limitations, risks, security and privacy concerns of performing an evaluation and management service by telephone and the availability of in person appointments. I also discussed with the patient that there may be a patient responsible charge related to this service. The patient expressed understanding and agreed to proceed.   History and Present Illness:   Pt calls the office today for chronic follow up. Pt is followed by Urologists every 6 months for Prostate Cancer.PT states he is followed by Cardiologists annually for CAD.  He states his Losartan is on back order and needs something else sent in.  Hypertension  This is a chronic problem. The current episode started more than 1 year ago. The problem has been resolved since onset. The problem is controlled (States last month at his Urologists his BP was normal, but can not remember reading). Pertinent negatives include no anxiety, headaches, peripheral edema or shortness of breath. Risk factors for coronary artery disease include dyslipidemia, male gender, smoking/tobacco exposure and sedentary lifestyle. The current treatment provides moderate improvement. Hypertensive end-organ damage includes CAD/MI. There is no history of heart failure.  Benign Prostatic Hypertrophy  This is a chronic problem. The current episode started more than 1 year ago. The problem has been resolved since onset. Irritative symptoms include nocturia (1). Obstructive symptoms do not include straining or a weak stream. Pertinent negatives include no hematuria, hesitancy, nausea or vomiting.  Hyperlipidemia  This  is a chronic problem. The current episode started more than 1 year ago. The problem is controlled. Recent lipid tests were reviewed and are normal. Pertinent negatives include no shortness of breath. Current antihyperlipidemic treatment includes statins. The current treatment provides moderate improvement of lipids. Risk factors for coronary artery disease include dyslipidemia, male sex, hypertension and a sedentary lifestyle.      Review of Systems  Respiratory: Negative for shortness of breath.   Gastrointestinal: Negative for nausea and vomiting.  Genitourinary: Positive for nocturia (1). Negative for hematuria and hesitancy.  Neurological: Negative for headaches.  All other systems reviewed and are negative.   Observations/Objective: No SOB or distress   Assessment and Plan: 1. Essential hypertension Will change losartan to Telmisartan  -Dash diet information given -Exercise encouraged - Stress Management  -Continue current meds -RTO in 3 month - telmisartan (MICARDIS) 80 MG tablet; Take 1 tablet (80 mg total) by mouth daily.  Dispense: 90 tablet; Refill: 1  2. Mixed hyperlipidemia  3. Smoker  4. Benign prostatic hyperplasia with nocturia  5. CAD S/P percutaneous coronary angioplasty  Continue medications  Smoking cessation discussed Social distancing discussed RTO in 3 months       I discussed the assessment and treatment plan with the patient. The patient was provided an opportunity to ask questions and all were answered. The patient agreed with the plan and demonstrated an understanding of the instructions.   The patient was advised to call back or seek an in-person evaluation if the symptoms worsen or if the condition fails to improve as anticipated.  The above assessment and management plan was discussed with the patient. The patient verbalized understanding of and has agreed to  the management plan. Patient is aware to call the clinic if symptoms persist or  worsen. Patient is aware when to return to the clinic for a follow-up visit. Patient educated on when it is appropriate to go to the emergency department.    Call ended 3:49 pm, I provided 15 minutes of non-face-to-face time during this encounter.    [name], FNP

## 2019-03-23 ENCOUNTER — Other Ambulatory Visit: Payer: Self-pay | Admitting: Family

## 2019-03-23 DIAGNOSIS — K219 Gastro-esophageal reflux disease without esophagitis: Secondary | ICD-10-CM

## 2019-03-25 NOTE — Telephone Encounter (Signed)
Pharmacy comment:  Alternative Requested: PT PAYING $ 18 FOR PANTO.  PT REQUESTING LOWER OUT-OF-POCKET COST.  SEE ALTERNATIVES AND ASSOCIATED COST: OMEPR $7

## 2019-04-23 ENCOUNTER — Other Ambulatory Visit: Payer: Self-pay | Admitting: Family

## 2019-04-23 DIAGNOSIS — I10 Essential (primary) hypertension: Secondary | ICD-10-CM

## 2019-05-02 ENCOUNTER — Other Ambulatory Visit: Payer: Self-pay | Admitting: Family

## 2019-05-08 ENCOUNTER — Other Ambulatory Visit: Payer: Self-pay | Admitting: Family

## 2019-05-15 ENCOUNTER — Other Ambulatory Visit: Payer: Self-pay

## 2019-05-16 ENCOUNTER — Encounter: Payer: Self-pay | Admitting: Family

## 2019-05-16 ENCOUNTER — Ambulatory Visit (INDEPENDENT_AMBULATORY_CARE_PROVIDER_SITE_OTHER): Payer: Medicare Other | Admitting: Family

## 2019-05-16 VITALS — BP 121/76 | HR 79 | Temp 97.4°F | Ht 73.0 in | Wt 189.2 lb

## 2019-05-16 DIAGNOSIS — Z23 Encounter for immunization: Secondary | ICD-10-CM | POA: Diagnosis not present

## 2019-05-16 DIAGNOSIS — R351 Nocturia: Secondary | ICD-10-CM

## 2019-05-16 DIAGNOSIS — C61 Malignant neoplasm of prostate: Secondary | ICD-10-CM

## 2019-05-16 DIAGNOSIS — Z9861 Coronary angioplasty status: Secondary | ICD-10-CM | POA: Diagnosis not present

## 2019-05-16 DIAGNOSIS — E559 Vitamin D deficiency, unspecified: Secondary | ICD-10-CM

## 2019-05-16 DIAGNOSIS — Z0000 Encounter for general adult medical examination without abnormal findings: Secondary | ICD-10-CM | POA: Diagnosis not present

## 2019-05-16 DIAGNOSIS — E782 Mixed hyperlipidemia: Secondary | ICD-10-CM

## 2019-05-16 DIAGNOSIS — I10 Essential (primary) hypertension: Secondary | ICD-10-CM

## 2019-05-16 DIAGNOSIS — N401 Enlarged prostate with lower urinary tract symptoms: Secondary | ICD-10-CM

## 2019-05-16 DIAGNOSIS — K219 Gastro-esophageal reflux disease without esophagitis: Secondary | ICD-10-CM | POA: Diagnosis not present

## 2019-05-16 DIAGNOSIS — Z0001 Encounter for general adult medical examination with abnormal findings: Secondary | ICD-10-CM

## 2019-05-16 DIAGNOSIS — I2510 Atherosclerotic heart disease of native coronary artery without angina pectoris: Secondary | ICD-10-CM

## 2019-05-16 DIAGNOSIS — F17200 Nicotine dependence, unspecified, uncomplicated: Secondary | ICD-10-CM

## 2019-05-16 DIAGNOSIS — N529 Male erectile dysfunction, unspecified: Secondary | ICD-10-CM

## 2019-05-16 MED ORDER — TELMISARTAN 80 MG PO TABS: 80.0000 mg | ORAL_TABLET | Freq: Every day | ORAL | 2 refills | Status: DC

## 2019-05-16 MED ORDER — AMLODIPINE BESYLATE 5 MG PO TABS: 5.0000 mg | ORAL_TABLET | Freq: Every day | ORAL | 2 refills | Status: DC

## 2019-05-16 MED ORDER — VITAMIN D (ERGOCALCIFEROL) 1.25 MG (50000 UNIT) PO CAPS: 50000.0000 [IU] | ORAL_CAPSULE | ORAL | 3 refills | Status: DC

## 2019-05-16 NOTE — Patient Instructions (Signed)

## 2019-05-16 NOTE — Addendum Note (Signed)
Addended by: Shelbie Ammons on: 05/16/2019 09:22 AM   Modules accepted: Orders

## 2019-05-16 NOTE — Progress Notes (Signed)
Subjective:    Patient ID: Paul Murillo, male    DOB: Apr 11, 1956, 63 y.o.   MRN: [id number]  Chief Complaint  Patient presents with  . Medical Management of Chronic Issues   Pt presents to the office today for CPE. Marland Kitchen Pt is followed by Urologists every 6 months for Prostate Cancer.PT states he is followed by Cardiologists annually for CAD. Hypertension This is a chronic problem. The current episode started more than 1 year ago. The problem has been waxing and waning since onset. The problem is controlled. Pertinent negatives include no headaches, malaise/fatigue, peripheral edema or shortness of breath. Risk factors for coronary artery disease include dyslipidemia, obesity and male gender. The current treatment provides moderate improvement. Hypertensive end-organ damage includes CAD/MI. There is no history of kidney disease.  Gastroesophageal Reflux He complains of heartburn. He reports no belching or no coughing. This is a chronic problem. The current episode started more than 1 year ago. The problem occurs occasionally. The problem has been waxing and waning. Risk factors include obesity. He has tried a PPI for the symptoms. The treatment provided moderate relief.  Benign Prostatic Hypertrophy This is a chronic problem. The current episode started more than 1 year ago. The problem has been waxing and waning since onset. Irritative symptoms include nocturia.  Hyperlipidemia This is a chronic problem. The current episode started more than 1 year ago. The problem is controlled. Recent lipid tests were reviewed and are normal. Exacerbating diseases include obesity. Pertinent negatives include no shortness of breath. Current antihyperlipidemic treatment includes statins. The current treatment provides moderate improvement of lipids. Risk factors for coronary artery disease include dyslipidemia, male sex, hypertension and a sedentary lifestyle.   ED PT takes Vardenafil as needed. Stable.     Review of Systems  Constitutional: Negative for malaise/fatigue.  Respiratory: Negative for cough and shortness of breath.   Gastrointestinal: Positive for heartburn.  Genitourinary: Positive for nocturia.  Neurological: Negative for headaches.  All other systems reviewed and are negative.      Objective:   Physical Exam Vitals signs reviewed.  Constitutional:      General: He is not in acute distress.    Appearance: He is well-developed.  HENT:     Head: Normocephalic.     Right Ear: Tympanic membrane normal.     Left Ear: Tympanic membrane normal.  Eyes:     General:        Right eye: No discharge.        Left eye: No discharge.     Pupils: Pupils are equal, round, and reactive to light.  Neck:     Musculoskeletal: Normal range of motion and neck supple.     Thyroid: No thyromegaly.  Cardiovascular:     Rate and Rhythm: Normal rate and regular rhythm.     Heart sounds: Normal heart sounds. No murmur.  Pulmonary:     Effort: Pulmonary effort is normal. No respiratory distress.     Breath sounds: Normal breath sounds. No wheezing.  Abdominal:     General: Bowel sounds are normal. There is no distension.     Palpations: Abdomen is soft.     Tenderness: There is no abdominal tenderness.  Musculoskeletal: Normal range of motion.        General: No tenderness.  Skin:    General: Skin is warm and dry.     Findings: No erythema or rash.  Neurological:     Mental Status: He is alert and oriented to  person, place, and time.     Cranial Nerves: No cranial nerve deficit.     Deep Tendon Reflexes: Reflexes are normal and symmetric.  Psychiatric:        Behavior: Behavior normal.        Thought Content: Thought content normal.        Judgment: Judgment normal.          BP 121/76   Pulse 79   Temp (!) 97.4 F (36.3 C) (Oral)   Ht 6' 1" (1.854 m)   Wt 189 lb 3.2 oz (85.8 kg)   BMI 24.96 kg/m   Assessment & Plan:  Paul Murillo comes in today with chief complaint  of Medical Management of Chronic Issues   Diagnosis and orders addressed:  1. Essential hypertension - telmisartan (MICARDIS) 80 MG tablet; Take 1 tablet (80 mg total) by mouth daily.  Dispense: 90 tablet; Refill: 2 - amLODipine (NORVASC) 5 MG tablet; Take 1 tablet (5 mg total) by mouth daily.  Dispense: 90 tablet; Refill: 2 - CBC with Differential/Platelet - CMP14+EGFR  2. CAD S/P percutaneous coronary angioplasty - CBC with Differential/Platelet - CMP14+EGFR - Lipid panel  3. Gastroesophageal reflux disease, esophagitis presence not specified - CBC with Differential/Platelet - CMP14+EGFR  4. Annual physical exam - CBC with Differential/Platelet - CMP14+EGFR - Lipid panel - VITAMIN D 25 Hydroxy (Vit-D Deficiency, Fractures) - TSH  5. Malignant neoplasm of prostate ([place])  - CBC with Differential/Platelet - CMP14+EGFR  6. Benign prostatic hyperplasia with nocturia - CBC with Differential/Platelet - CMP14+EGFR  7. Erectile dysfunction, unspecified erectile dysfunction type - CBC with Differential/Platelet - CMP14+EGFR  8. Mixed hyperlipidemia  - CBC with Differential/Platelet - CMP14+EGFR - Lipid panel  9. Smoker - CBC with Differential/Platelet - CMP14+EGFR  10. Vitamin D deficiency - Vitamin D, Ergocalciferol, (DRISDOL) 1.25 MG (50000 UT) CAPS capsule; Take 1 capsule (50,000 Units total) by mouth every 7 (seven) days.  Dispense: 12 capsule; Refill: 3 - CBC with Differential/Platelet - CMP14+EGFR - VITAMIN D 25 Hydroxy (Vit-D Deficiency, Fractures)   Labs pending Health Maintenance reviewed Diet and exercise encouraged  Follow up plan: 6 months   [name], FNP

## 2019-05-17 ENCOUNTER — Other Ambulatory Visit: Payer: Self-pay | Admitting: Family

## 2019-05-17 LAB — CMP14+EGFR
ALT: 18 IU/L (ref 0–44)
AST: 20 IU/L (ref 0–40)
Albumin/Globulin Ratio: 2 (ref 1.2–2.2)
Albumin: 4.7 g/dL (ref 3.8–4.8)
Alkaline Phosphatase: 65 IU/L (ref 39–117)
BUN/Creatinine Ratio: 12 (ref 10–24)
BUN: 15 mg/dL (ref 8–27)
Bilirubin Total: 0.5 mg/dL (ref 0.0–1.2)
CO2: 20 mmol/L (ref 20–29)
Calcium: 9.6 mg/dL (ref 8.6–10.2)
Chloride: 102 mmol/L (ref 96–106)
Creatinine, Ser: 1.22 mg/dL (ref 0.76–1.27)
GFR calc Af Amer: 71 mL/min/{1.73_m2} (ref >59)
GFR calc non Af Amer: 62 mL/min/{1.73_m2} (ref >59)
Globulin, Total: 2.3 g/dL (ref 1.5–4.5)
Glucose: 107 mg/dL — ABNORMAL HIGH (ref 65–99)
Potassium: 4.4 mmol/L (ref 3.5–5.2)
Sodium: 139 mmol/L (ref 134–144)
Total Protein: 7 g/dL (ref 6.0–8.5)

## 2019-05-17 LAB — CBC WITH DIFFERENTIAL/PLATELET
Basophils Absolute: 0 10*3/uL (ref 0.0–0.2)
Basos: 0 %
EOS (ABSOLUTE): 0.2 10*3/uL (ref 0.0–0.4)
Eos: 2 %
Hematocrit: 38.1 % (ref 37.5–51.0)
Hemoglobin: 13.3 g/dL (ref 13.0–17.7)
Immature Grans (Abs): 0 10*3/uL (ref 0.0–0.1)
Immature Granulocytes: 0 %
Lymphocytes Absolute: 2.4 10*3/uL (ref 0.7–3.1)
Lymphs: 24 %
MCH: 31.3 pg (ref 26.6–33.0)
MCHC: 34.9 g/dL (ref 31.5–35.7)
MCV: 90 fL (ref 79–97)
Monocytes Absolute: 0.6 10*3/uL (ref 0.1–0.9)
Monocytes: 6 %
Neutrophils Absolute: 6.9 10*3/uL (ref 1.4–7.0)
Neutrophils: 68 %
Platelets: 257 10*3/uL (ref 150–450)
RBC: 4.25 x10E6/uL (ref 4.14–5.80)
RDW: 13.2 % (ref 11.6–15.4)
WBC: 10.3 10*3/uL (ref 3.4–10.8)

## 2019-05-17 LAB — LIPID PANEL
Chol/HDL Ratio: 2.8 ratio (ref 0.0–5.0)
Cholesterol, Total: 165 mg/dL (ref 100–199)
HDL: 58 mg/dL (ref >39)
LDL Calculated: 97 mg/dL (ref 0–99)
Triglycerides: 48 mg/dL (ref 0–149)
VLDL Cholesterol Cal: 10 mg/dL (ref 5–40)

## 2019-05-17 LAB — TSH: TSH: 3.14 u[IU]/mL (ref 0.450–4.500)

## 2019-05-17 LAB — VITAMIN D 25 HYDROXY (VIT D DEFICIENCY, FRACTURES): Vit D, 25-Hydroxy: 45.2 ng/mL (ref 30.0–100.0)

## 2019-05-17 MED ORDER — ATORVASTATIN CALCIUM 80 MG PO TABS: 80.0000 mg | ORAL_TABLET | Freq: Every day | ORAL | 1 refills | Status: DC

## 2019-05-31 ENCOUNTER — Other Ambulatory Visit: Payer: Self-pay | Admitting: Family

## 2019-05-31 DIAGNOSIS — E782 Mixed hyperlipidemia: Secondary | ICD-10-CM

## 2019-07-14 DIAGNOSIS — I252 Old myocardial infarction: Secondary | ICD-10-CM | POA: Insufficient documentation

## 2019-07-15 DIAGNOSIS — I252 Old myocardial infarction: Secondary | ICD-10-CM | POA: Diagnosis not present

## 2019-07-15 DIAGNOSIS — I2510 Atherosclerotic heart disease of native coronary artery without angina pectoris: Secondary | ICD-10-CM | POA: Diagnosis not present

## 2019-07-15 DIAGNOSIS — E782 Mixed hyperlipidemia: Secondary | ICD-10-CM | POA: Diagnosis not present

## 2019-07-15 DIAGNOSIS — I10 Essential (primary) hypertension: Secondary | ICD-10-CM | POA: Diagnosis not present

## 2019-07-21 ENCOUNTER — Other Ambulatory Visit: Payer: Self-pay | Admitting: Family

## 2019-07-21 DIAGNOSIS — K219 Gastro-esophageal reflux disease without esophagitis: Secondary | ICD-10-CM

## 2019-07-26 ENCOUNTER — Encounter: Payer: Self-pay | Admitting: Gastroenterology

## 2019-10-27 ENCOUNTER — Other Ambulatory Visit: Payer: Self-pay | Admitting: Family

## 2019-10-27 DIAGNOSIS — K219 Gastro-esophageal reflux disease without esophagitis: Secondary | ICD-10-CM

## 2019-10-28 NOTE — Telephone Encounter (Signed)
Ov 11/18/19

## 2019-11-07 ENCOUNTER — Other Ambulatory Visit: Payer: Self-pay | Admitting: Family

## 2019-11-07 DIAGNOSIS — E782 Mixed hyperlipidemia: Secondary | ICD-10-CM

## 2019-11-18 ENCOUNTER — Encounter: Payer: Self-pay | Admitting: Family

## 2019-11-18 ENCOUNTER — Ambulatory Visit (INDEPENDENT_AMBULATORY_CARE_PROVIDER_SITE_OTHER): Payer: Medicare Other | Admitting: Family

## 2019-11-18 ENCOUNTER — Other Ambulatory Visit: Payer: Self-pay

## 2019-11-18 ENCOUNTER — Telehealth: Payer: Self-pay | Admitting: Family

## 2019-11-18 VITALS — BP 125/81 | HR 77 | Temp 98.7°F | Ht 73.0 in | Wt 187.2 lb

## 2019-11-18 DIAGNOSIS — E782 Mixed hyperlipidemia: Secondary | ICD-10-CM

## 2019-11-18 DIAGNOSIS — I2510 Atherosclerotic heart disease of native coronary artery without angina pectoris: Secondary | ICD-10-CM

## 2019-11-18 DIAGNOSIS — N401 Enlarged prostate with lower urinary tract symptoms: Secondary | ICD-10-CM | POA: Diagnosis not present

## 2019-11-18 DIAGNOSIS — E559 Vitamin D deficiency, unspecified: Secondary | ICD-10-CM

## 2019-11-18 DIAGNOSIS — K219 Gastro-esophageal reflux disease without esophagitis: Secondary | ICD-10-CM

## 2019-11-18 DIAGNOSIS — Z9861 Coronary angioplasty status: Secondary | ICD-10-CM | POA: Diagnosis not present

## 2019-11-18 DIAGNOSIS — R351 Nocturia: Secondary | ICD-10-CM

## 2019-11-18 DIAGNOSIS — R7309 Other abnormal glucose: Secondary | ICD-10-CM | POA: Diagnosis not present

## 2019-11-18 DIAGNOSIS — I10 Essential (primary) hypertension: Secondary | ICD-10-CM | POA: Diagnosis not present

## 2019-11-18 DIAGNOSIS — F17200 Nicotine dependence, unspecified, uncomplicated: Secondary | ICD-10-CM

## 2019-11-18 DIAGNOSIS — E049 Nontoxic goiter, unspecified: Secondary | ICD-10-CM

## 2019-11-18 NOTE — Patient Instructions (Signed)
Goiter  A goiter is an enlarged thyroid gland. The thyroid is located in the lower front of the neck. It makes hormones that affect many body parts and systems, including the system that affects how quickly the body burns fuel for energy (metabolism). Most goiters are painless and are not a cause for concern. Some goiters can affect the way your thyroid makes thyroid hormones. Goiters and conditions that cause goiters can be treated, if necessary. What are the causes? Common causes of this condition include:  Lack (deficiency) of a mineral called iodine. The thyroid gland uses iodine to make thyroid hormones.  Diseases that attack healthy cells in the body (autoimmune diseases) and affect thyroid function, such as Graves' disease or Hashimoto's disease. These diseases may cause the body to produce too much thyroid hormone (hyperthyroidism) or too little of the hormone (hypothyroidism).  Conditions that cause inflammation of the thyroid (thyroiditis).  One or more small growths on the thyroid (nodular goiter). Other causes include:  Medical problems caused by abnormal genes that are passed from parent to child (genetic defects).  Thyroid injury or infection.  Tumors that may or may not be cancerous.  Pregnancy.  Certain medicines.  Exposure to radiation. In some cases, the cause may not be known. What increases the risk? This condition is more likely to develop in:  People who do not get enough iodine in their diet.  People who have a family history of goiter.  Women.  People who are older than age 40.  People who smoke tobacco.  People who have had exposure to radiation. What are the signs or symptoms? The main symptom of this condition is swelling in the lower, front part of the neck. This swelling can range from a very small bump to a large lump. Other symptoms may include:  A tight feeling in the throat.  A hoarse voice.  Coughing.  Wheezing.  Difficulty  swallowing or breathing.  Bulging veins in the neck.  Dizziness. When a goiter is the result of an overactive thyroid (hyperthyroidism), symptoms may also include:  Nervousness or restlessness.  Inability to tolerate heat.  Unexplained weight loss.  Diarrhea.  Change in the texture of hair or skin.  Changes in heartbeat, such as skipped beats, extra beats, or a rapid heart rate.  Loss of menstruation.  Shaky hands.  Increased appetite.  Sleep problems. When a goiter is the result of an underactive thyroid (hypothyroidism), symptoms may also include:  Feeling like you have no energy (lethargy).  Inability to tolerate cold.  Weight gain that is not explained by a change in diet or exercise habits.  Dry skin.  Coarse hair.  Irregular menstrual periods.  Constipation.  Sadness or depression.  Fatigue. In some cases, there may not be any symptoms and the thyroid hormone levels may be normal. How is this diagnosed? This condition may be diagnosed based on your symptoms, your medical history, and a physical exam. You may have tests, such as:  Blood tests to check thyroid function.  Imaging tests, such as: ? Ultrasound. ? CT scan. ? MRI. ? Thyroid scan.  Removal of a tissue sample (biopsy) of the goiter or any nodules. The sample will be tested to check for cancer. How is this treated? Treatment for this condition depends on the cause and your symptoms. Treatment may include:  Medicines to regulate thyroid hormone levels.  Anti-inflammatory medicines or steroid medicines, if the goiter is caused by inflammation.  Iodine supplements or changes to your   diet, if the goiter is caused by iodine deficiency.  Radioactive iodine treatment.  Surgery to remove your thyroid. In some cases, you may only need regular check-ups with your health care provider to monitor your condition, and you may not need treatment. Follow these instructions at home:  Follow  instructions from your health care provider about any changes to your diet.  Take over-the-counter and prescription medicines only as told by your health care provider. These include supplements.  Do not use any products that contain nicotine or tobacco, such as cigarettes and e-cigarettes. If you need help quitting, ask your health care provider.  Keep all follow-up visits as told by your health care provider. This is important. Contact a health care provider if:  Your symptoms do not get better with treatment.  You have nausea, vomiting, or diarrhea. Get help right away if:  You have sudden, unexplained confusion or other mental changes.  You have a fever.  You have chest pain.  You have trouble breathing or swallowing.  You suddenly become very weak.  You experience extreme restlessness.  You feel your heart racing. Summary  A goiter is an enlarged thyroid gland.  The thyroid gland is located in the lower front of the neck. It makes hormones that affect many body parts and systems, including the system that affects how quickly the body burns fuel for energy (metabolism).  The main symptom of this condition is swelling in the lower, front part of the neck. This swelling can range from a very small bump to a large lump.  Treatment for this condition depends on the cause and your symptoms. You may need medicines, supplements, or regular monitoring of your condition. This information is not intended to replace advice given to you by your health care provider. Make sure you discuss any questions you have with your health care provider. Document Revised: 09/22/2017 Document Reviewed: 07/06/2017 [employer] Patient Education  2020 [employer]  

## 2019-11-18 NOTE — Progress Notes (Signed)
Subjective:    Patient ID: Paul Murillo, male    DOB: 07/23/1956, 64 y.o.   MRN: [id number]  Chief Complaint  Patient presents with  . Medical Management of Chronic Issues    6 mth    Pt presents to the office today for chronic follow up . Pt is followed by Urologists every 6 months for Prostate Cancer.PT states he is followed by Cardiologists annually for CAD. Hypertension This is a chronic problem. The current episode started more than 1 year ago. The problem has been resolved since onset. The problem is controlled. Associated symptoms include malaise/fatigue. Pertinent negatives include no peripheral edema or shortness of breath. Risk factors for coronary artery disease include dyslipidemia, obesity, male gender and sedentary lifestyle. The current treatment provides moderate improvement. Hypertensive end-organ damage includes CAD/MI.  Gastroesophageal Reflux He complains of belching and heartburn. This is a chronic problem. The current episode started more than 1 year ago. The problem occurs rarely. The problem has been waxing and waning. Risk factors include obesity. He has tried a PPI for the symptoms. The treatment provided moderate relief.  Benign Prostatic Hypertrophy This is a chronic problem. The current episode started more than 1 year ago. Irritative symptoms include nocturia (1-2).  Hyperlipidemia This is a chronic problem. The current episode started more than 1 year ago. The problem is controlled. Recent lipid tests were reviewed and are normal. He has no history of obesity. Pertinent negatives include no shortness of breath. Current antihyperlipidemic treatment includes statins. The current treatment provides moderate improvement of lipids. Risk factors for coronary artery disease include dyslipidemia.      Review of Systems  Constitutional: Positive for malaise/fatigue.  Respiratory: Negative for shortness of breath.   Gastrointestinal: Positive for heartburn.    Genitourinary: Positive for nocturia (1-2).  All other systems reviewed and are negative.      Objective:   Physical Exam Vitals reviewed.  Constitutional:      General: He is not in acute distress.    Appearance: He is well-developed.  HENT:     Head: Normocephalic.     Right Ear: External ear normal.     Left Ear: External ear normal.     Nose:     Comments: Goiter  Eyes:     General:        Right eye: No discharge.        Left eye: No discharge.     Pupils: Pupils are equal, round, and reactive to light.  Neck:     Thyroid: No thyromegaly.  Cardiovascular:     Rate and Rhythm: Normal rate and regular rhythm.     Heart sounds: Normal heart sounds. No murmur.  Pulmonary:     Effort: Pulmonary effort is normal. No respiratory distress.     Breath sounds: Normal breath sounds. No wheezing.  Abdominal:     General: Bowel sounds are normal. There is no distension.     Palpations: Abdomen is soft.     Tenderness: There is no abdominal tenderness.  Musculoskeletal:        General: No tenderness. Normal range of motion.     Cervical back: Normal range of motion and neck supple.  Skin:    General: Skin is warm and dry.     Findings: No erythema or rash.  Neurological:     Mental Status: He is alert and oriented to person, place, and time.     Cranial Nerves: No cranial nerve deficit.  Deep Tendon Reflexes: Reflexes are normal and symmetric.  Psychiatric:        Behavior: Behavior normal.        Thought Content: Thought content normal.        Judgment: Judgment normal.      BP 125/81   Pulse 77   Temp 98.7 F (37.1 C) (Temporal)   Ht 6' 1"  (1.854 m)   Wt 187 lb 3.2 oz (84.9 kg)   SpO2 98%   BMI 24.70 kg/m       Assessment & Plan:  Paul Murillo comes in today with chief complaint of Medical Management of Chronic Issues (6 mth )   Diagnosis and orders addressed:  1. CAD S/P percutaneous coronary angioplasty - CMP14+EGFR - CBC with  Differential/Platelet  2. Essential hypertension - CMP14+EGFR - CBC with Differential/Platelet  3. Gastroesophageal reflux disease, unspecified whether esophagitis present - CMP14+EGFR - CBC with Differential/Platelet  4. Benign prostatic hyperplasia with nocturia - CMP14+EGFR - CBC with Differential/Platelet  5. Mixed hyperlipidemia - CMP14+EGFR - CBC with Differential/Platelet  6. Smoker - CMP14+EGFR - CBC with Differential/Platelet  7. Vitamin D deficiency - CMP14+EGFR - CBC with Differential/Platelet  8. Goiter - CMP14+EGFR - CBC with Differential/Platelet - TSH - US THYROID; Future   Labs pending Health Maintenance reviewed Diet and exercise encouraged  Follow up plan: 6 months and keep specialists appt   [name], FNP

## 2019-11-19 ENCOUNTER — Other Ambulatory Visit: Payer: Self-pay | Admitting: Family

## 2019-11-19 ENCOUNTER — Other Ambulatory Visit: Payer: Self-pay | Admitting: *Deleted

## 2019-11-19 DIAGNOSIS — R7309 Other abnormal glucose: Secondary | ICD-10-CM

## 2019-11-19 LAB — CBC WITH DIFFERENTIAL/PLATELET
Basophils Absolute: 0 10*3/uL (ref 0.0–0.2)
Basos: 1 %
EOS (ABSOLUTE): 0.3 10*3/uL (ref 0.0–0.4)
Eos: 3 %
Hematocrit: 40.1 % (ref 37.5–51.0)
Hemoglobin: 13.5 g/dL (ref 13.0–17.7)
Immature Grans (Abs): 0 10*3/uL (ref 0.0–0.1)
Immature Granulocytes: 0 %
Lymphocytes Absolute: 2.1 10*3/uL (ref 0.7–3.1)
Lymphs: 25 %
MCH: 31.1 pg (ref 26.6–33.0)
MCHC: 33.7 g/dL (ref 31.5–35.7)
MCV: 92 fL (ref 79–97)
Monocytes Absolute: 0.6 10*3/uL (ref 0.1–0.9)
Monocytes: 7 %
Neutrophils Absolute: 5.4 10*3/uL (ref 1.4–7.0)
Neutrophils: 64 %
Platelets: 274 10*3/uL (ref 150–450)
RBC: 4.34 x10E6/uL (ref 4.14–5.80)
RDW: 13.4 % (ref 11.6–15.4)
WBC: 8.3 10*3/uL (ref 3.4–10.8)

## 2019-11-19 LAB — CMP14+EGFR
ALT: 18 IU/L (ref 0–44)
AST: 18 IU/L (ref 0–40)
Albumin/Globulin Ratio: 1.8 (ref 1.2–2.2)
Albumin: 4.4 g/dL (ref 3.8–4.8)
Alkaline Phosphatase: 75 IU/L (ref 39–117)
BUN/Creatinine Ratio: 10 (ref 10–24)
BUN: 13 mg/dL (ref 8–27)
Bilirubin Total: 0.5 mg/dL (ref 0.0–1.2)
CO2: 23 mmol/L (ref 20–29)
Calcium: 9.6 mg/dL (ref 8.6–10.2)
Chloride: 101 mmol/L (ref 96–106)
Creatinine, Ser: 1.29 mg/dL — ABNORMAL HIGH (ref 0.76–1.27)
GFR calc Af Amer: 67 mL/min/{1.73_m2} (ref >59)
GFR calc non Af Amer: 58 mL/min/{1.73_m2} — ABNORMAL LOW (ref >59)
Globulin, Total: 2.5 g/dL (ref 1.5–4.5)
Glucose: 110 mg/dL — ABNORMAL HIGH (ref 65–99)
Potassium: 4.7 mmol/L (ref 3.5–5.2)
Sodium: 138 mmol/L (ref 134–144)
Total Protein: 6.9 g/dL (ref 6.0–8.5)

## 2019-11-19 LAB — HGB A1C W/O EAG: Hgb A1c MFr Bld: 5.8 % — ABNORMAL HIGH (ref 4.8–5.6)

## 2019-11-19 LAB — TSH: TSH: 3.64 u[IU]/mL (ref 0.450–4.500)

## 2019-11-19 LAB — SPECIMEN STATUS REPORT

## 2019-11-21 ENCOUNTER — Ambulatory Visit (HOSPITAL_COMMUNITY): Payer: Medicare Other

## 2019-11-21 NOTE — Progress Notes (Signed)
Lmtcb.

## 2019-11-28 ENCOUNTER — Ambulatory Visit (HOSPITAL_COMMUNITY)
Admission: RE | Admit: 2019-11-28 | Discharge: 2019-11-28 | Disposition: A | Discharge status: Home / Self Care | Payer: Medicare Other | Source: Ambulatory Visit | Attending: Family | Admitting: Family

## 2019-11-28 ENCOUNTER — Other Ambulatory Visit: Payer: Self-pay

## 2019-11-28 DIAGNOSIS — E049 Nontoxic goiter, unspecified: Secondary | ICD-10-CM | POA: Diagnosis not present

## 2019-11-28 DIAGNOSIS — E010 Iodine-deficiency related diffuse (endemic) goiter: Secondary | ICD-10-CM | POA: Diagnosis not present

## 2020-02-09 ENCOUNTER — Other Ambulatory Visit: Payer: Self-pay | Admitting: Family

## 2020-02-09 DIAGNOSIS — K219 Gastro-esophageal reflux disease without esophagitis: Secondary | ICD-10-CM

## 2020-02-18 ENCOUNTER — Other Ambulatory Visit: Payer: Self-pay | Admitting: Family

## 2020-02-18 DIAGNOSIS — I10 Essential (primary) hypertension: Secondary | ICD-10-CM

## 2020-03-24 ENCOUNTER — Ambulatory Visit (INDEPENDENT_AMBULATORY_CARE_PROVIDER_SITE_OTHER): Payer: Medicare Other | Admitting: Family

## 2020-03-24 ENCOUNTER — Encounter: Payer: Self-pay | Admitting: Family

## 2020-03-24 DIAGNOSIS — E782 Mixed hyperlipidemia: Secondary | ICD-10-CM | POA: Diagnosis not present

## 2020-03-24 DIAGNOSIS — I2510 Atherosclerotic heart disease of native coronary artery without angina pectoris: Secondary | ICD-10-CM

## 2020-03-24 DIAGNOSIS — I10 Essential (primary) hypertension: Secondary | ICD-10-CM

## 2020-03-24 DIAGNOSIS — N401 Enlarged prostate with lower urinary tract symptoms: Secondary | ICD-10-CM | POA: Diagnosis not present

## 2020-03-24 DIAGNOSIS — K219 Gastro-esophageal reflux disease without esophagitis: Secondary | ICD-10-CM | POA: Diagnosis not present

## 2020-03-24 DIAGNOSIS — E559 Vitamin D deficiency, unspecified: Secondary | ICD-10-CM | POA: Diagnosis not present

## 2020-03-24 DIAGNOSIS — Z9861 Coronary angioplasty status: Secondary | ICD-10-CM

## 2020-03-24 DIAGNOSIS — R351 Nocturia: Secondary | ICD-10-CM

## 2020-03-24 DIAGNOSIS — Z8546 Personal history of malignant neoplasm of prostate: Secondary | ICD-10-CM

## 2020-03-24 DIAGNOSIS — F17200 Nicotine dependence, unspecified, uncomplicated: Secondary | ICD-10-CM

## 2020-03-24 MED ORDER — OMEPRAZOLE 20 MG PO CPDR: DELAYED_RELEASE_CAPSULE | ORAL | 4 refills | Status: DC

## 2020-03-24 MED ORDER — AMLODIPINE BESYLATE 5 MG PO TABS: 5.0000 mg | ORAL_TABLET | Freq: Every day | ORAL | 4 refills | Status: DC

## 2020-03-24 MED ORDER — TELMISARTAN 80 MG PO TABS: 80.0000 mg | ORAL_TABLET | Freq: Every day | ORAL | 2 refills | Status: DC

## 2020-03-24 MED ORDER — ATORVASTATIN CALCIUM 80 MG PO TABS: 80.0000 mg | ORAL_TABLET | Freq: Every day | ORAL | 4 refills | Status: DC

## 2020-03-24 NOTE — Progress Notes (Signed)
Virtual Visit via telephone Note Due to COVID-19 pandemic this visit was conducted virtually. This visit type was conducted due to national recommendations for restrictions regarding the COVID-19 Pandemic (e.g. social distancing, sheltering in place) in an effort to limit this patient's exposure and mitigate transmission in our community. All issues noted in this document were discussed and addressed.  A physical exam was not performed with this format.  I connected with Paul Murillo on 03/24/20 at 8:20 AM by telephone and verified that I am speaking with the correct person using two identifiers. Paul Murillo is currently located at work and no one is currently with him  during visit. The provider, [name], FNP is located in their office at time of visit.  I discussed the limitations, risks, security and privacy concerns of performing an evaluation and management service by telephone and the availability of in person appointments. I also discussed with the patient that there may be a patient responsible charge related to this service. The patient expressed understanding and agreed to proceed.   History and Present Illness:  Pt presents to the office today forchronic follow up. Pt is followed by Urologists every 6 months for Prostate Cancer.PT states he is followed by Cardiologists annually for CAD. Hypertension This is a chronic problem. The current episode started more than 1 year ago. The problem has been resolved since onset. The problem is controlled. Pertinent negatives include no malaise/fatigue, peripheral edema or shortness of breath. Risk factors for coronary artery disease include dyslipidemia, male gender, sedentary lifestyle and smoking/tobacco exposure. The current treatment provides moderate improvement. Hypertensive end-organ damage includes CAD/MI.  Gastroesophageal Reflux He complains of belching and heartburn. This is a chronic problem. The current episode started more  than 1 year ago. The problem occurs occasionally. The problem has been waxing and waning. He has tried a PPI for the symptoms. The treatment provided moderate relief.  Hyperlipidemia This is a chronic problem. The current episode started more than 1 year ago. The problem is controlled. Recent lipid tests were reviewed and are normal. Pertinent negatives include no shortness of breath. Current antihyperlipidemic treatment includes statins. The current treatment provides moderate improvement of lipids. Risk factors for coronary artery disease include dyslipidemia, hypertension, male sex and a sedentary lifestyle.  Nicotine Dependence Presents for follow-up visit. His urge triggers include company of smokers. He smokes < 1/2 a pack of cigarettes per day.  Benign Prostatic Hypertrophy This is a chronic problem. The problem has been waxing and waning since onset. Irritative symptoms include nocturia (2).      Review of Systems  Constitutional: Negative for malaise/fatigue.  Respiratory: Negative for shortness of breath.   Gastrointestinal: Positive for heartburn.  Genitourinary: Positive for nocturia (2).  All other systems reviewed and are negative.    Observations/Objective: No SOB or distress noted   Assessment and Plan: Paul Murillo comes in today with chief complaint of No chief complaint on file.   Diagnosis and orders addressed:  1. Essential hypertension - CMP14+EGFR - amLODipine (NORVASC) 5 MG tablet; Take 1 tablet (5 mg total) by mouth daily.  Dispense: 90 tablet; Refill: 4 - telmisartan (MICARDIS) 80 MG tablet; Take 1 tablet (80 mg total) by mouth daily.  Dispense: 90 tablet; Refill: 2  2. Gastroesophageal reflux disease, unspecified whether esophagitis present - CMP14+EGFR - omeprazole (PRILOSEC) 20 MG capsule; PLEASE SPECIFY DIRECTIONS, REFILLS AND QUANTITY  Dispense: 90 capsule; Refill: 4  3. Benign prostatic hyperplasia with nocturia - CMP14+EGFR - Ambulatory  referral  to Urology  4. Mixed hyperlipidemia - CMP14+EGFR  5. Vitamin D deficiency - CMP14+EGFR  6. Smoker - CMP14+EGFR  7. CAD S/P percutaneous coronary angioplasty - CMP14+EGFR  8. H/O malignant neoplasm of prostate - Ambulatory referral to Urology  9. Gastroesophageal reflux disease    Labs pending Health Maintenance reviewed Diet and exercise encouraged  Follow up plan: 6 months   I discussed the assessment and treatment plan with the patient. The patient was provided an opportunity to ask questions and all were answered. The patient agreed with the plan and demonstrated an understanding of the instructions.   The patient was advised to call back or seek an in-person evaluation if the symptoms worsen or if the condition fails to improve as anticipated.  The above assessment and management plan was discussed with the patient. The patient verbalized understanding of and has agreed to the management plan. Patient is aware to call the clinic if symptoms persist or worsen. Patient is aware when to return to the clinic for a follow-up visit. Patient educated on when it is appropriate to go to the emergency department.   Time call ended:  8:40 AM  I provided 20 minutes of non-face-to-face time during this encounter.    [name], FNP

## 2020-04-18 ENCOUNTER — Other Ambulatory Visit: Payer: Self-pay | Admitting: Family

## 2020-04-18 DIAGNOSIS — E559 Vitamin D deficiency, unspecified: Secondary | ICD-10-CM

## 2020-05-04 ENCOUNTER — Other Ambulatory Visit: Payer: Self-pay | Admitting: Family

## 2020-05-14 DIAGNOSIS — R3915 Urgency of urination: Secondary | ICD-10-CM | POA: Diagnosis not present

## 2020-05-19 ENCOUNTER — Ambulatory Visit (INDEPENDENT_AMBULATORY_CARE_PROVIDER_SITE_OTHER): Payer: Medicare Other | Admitting: Family

## 2020-05-19 ENCOUNTER — Other Ambulatory Visit: Payer: Self-pay

## 2020-05-19 ENCOUNTER — Encounter: Payer: Self-pay | Admitting: Family

## 2020-05-19 VITALS — BP 140/87 | HR 68 | Temp 98.2°F | Ht 73.0 in | Wt 188.6 lb

## 2020-05-19 DIAGNOSIS — E782 Mixed hyperlipidemia: Secondary | ICD-10-CM

## 2020-05-19 DIAGNOSIS — K219 Gastro-esophageal reflux disease without esophagitis: Secondary | ICD-10-CM | POA: Diagnosis not present

## 2020-05-19 DIAGNOSIS — I2510 Atherosclerotic heart disease of native coronary artery without angina pectoris: Secondary | ICD-10-CM | POA: Diagnosis not present

## 2020-05-19 DIAGNOSIS — Z0001 Encounter for general adult medical examination with abnormal findings: Secondary | ICD-10-CM

## 2020-05-19 DIAGNOSIS — E559 Vitamin D deficiency, unspecified: Secondary | ICD-10-CM

## 2020-05-19 DIAGNOSIS — Z0000 Encounter for general adult medical examination without abnormal findings: Secondary | ICD-10-CM | POA: Diagnosis not present

## 2020-05-19 DIAGNOSIS — R351 Nocturia: Secondary | ICD-10-CM

## 2020-05-19 DIAGNOSIS — Z8546 Personal history of malignant neoplasm of prostate: Secondary | ICD-10-CM

## 2020-05-19 DIAGNOSIS — Z9861 Coronary angioplasty status: Secondary | ICD-10-CM | POA: Diagnosis not present

## 2020-05-19 DIAGNOSIS — N401 Enlarged prostate with lower urinary tract symptoms: Secondary | ICD-10-CM

## 2020-05-19 DIAGNOSIS — I10 Essential (primary) hypertension: Secondary | ICD-10-CM

## 2020-05-19 NOTE — Patient Instructions (Signed)

## 2020-05-19 NOTE — Progress Notes (Signed)
Subjective:    Patient ID: Paul Murillo, male    DOB: Oct 24, 1956, 64 y.o.   MRN: [id number]  Chief Complaint  Patient presents with  . Medical Management of Chronic Issues     mth check up no concerns  . Hypertension   Pt presents to the office today forCPE. Pt is followed by Urologists every 6 months for Prostate Cancer.PT states he is followed by Cardiologists annually for CAD. He states he is doing well and retired 05/08/20.  Hypertension This is a chronic problem. The current episode started more than 1 year ago. The problem has been resolved since onset. The problem is controlled. Pertinent negatives include no headaches, malaise/fatigue or peripheral edema. Risk factors for coronary artery disease include dyslipidemia, male gender and sedentary lifestyle. There is no history of CVA or heart failure.  Gastroesophageal Reflux He complains of belching and heartburn. This is a chronic problem. The current episode started more than 1 year ago. The problem occurs occasionally. The problem has been waxing and waning. The symptoms are aggravated by certain foods. He has tried a PPI for the symptoms. The treatment provided moderate relief.  Benign Prostatic Hypertrophy This is a chronic problem. The current episode started more than 1 year ago. Irritative symptoms include nocturia (2).  Hyperlipidemia This is a chronic problem. The current episode started more than 1 year ago. The problem is controlled. Recent lipid tests were reviewed and are normal. Current antihyperlipidemic treatment includes statins. The current treatment provides moderate improvement of lipids. Risk factors for coronary artery disease include a sedentary lifestyle, hypertension and male sex.      Review of Systems  Constitutional: Negative for malaise/fatigue.  Gastrointestinal: Positive for heartburn.  Genitourinary: Positive for nocturia (2).  Neurological: Negative for headaches.  All other systems reviewed and  are negative.  Family History  Problem Relation Age of Onset  . Heart disease Father   . Hypertension Father   . Diabetes Brother   . Cancer Neg Hx    Social History   Socioeconomic History  . Marital status: Married    Spouse name: Not on file  . Number of children: Not on file  . Years of education: Not on file  . Highest education level: Not on file  Occupational History  . Not on file  Tobacco Use  . Smoking status: Former Smoker    Types: Cigarettes    Quit date: 05/08/2020    Years since quitting: 0.0  . Smokeless tobacco: Never Used  Vaping Use  . Vaping Use: Never used  Substance and Sexual Activity  . Alcohol use: Yes    Alcohol/week: 0.0 standard drinks    Comment: occ.  . Drug use: No  . Sexual activity: Yes  Other Topics Concern  . Not on file  Social History Narrative  . Not on file   Social Determinants of Health   Financial Resource Strain:   . Difficulty of Paying Living Expenses:   Food Insecurity:   . Worried About Charity fundraiser in the Last Year:   . Arboriculturist in the Last Year:   Transportation Needs:   . Film/video editor (Medical):   Marland Kitchen Lack of Transportation (Non-Medical):   Physical Activity:   . Days of Exercise per Week:   . Minutes of Exercise per Session:   Stress:   . Feeling of Stress :   Social Connections:   . Frequency of Communication with Friends and Family:   .  Frequency of Social Gatherings with Friends and Family:   . Attends Religious Services:   . Active Member of Clubs or Organizations:   . Attends Archivist Meetings:   Marland Kitchen Marital Status:        Objective:   Physical Exam Vitals reviewed.  Constitutional:      General: He is not in acute distress.    Appearance: He is well-developed.  HENT:     Head: Normocephalic.     Right Ear: Tympanic membrane normal.     Left Ear: Tympanic membrane normal.  Eyes:     General:        Right eye: No discharge.        Left eye: No discharge.      Pupils: Pupils are equal, round, and reactive to light.  Neck:     Thyroid: No thyromegaly.  Cardiovascular:     Rate and Rhythm: Normal rate and regular rhythm.     Heart sounds: Normal heart sounds. No murmur heard.   Pulmonary:     Effort: Pulmonary effort is normal. No respiratory distress.     Breath sounds: Normal breath sounds. No wheezing.  Abdominal:     General: Bowel sounds are normal. There is no distension.     Palpations: Abdomen is soft.     Tenderness: There is no abdominal tenderness.  Musculoskeletal:        General: No tenderness. Normal range of motion.     Cervical back: Normal range of motion and neck supple.  Skin:    General: Skin is warm and dry.     Findings: No erythema or rash.  Neurological:     Mental Status: He is alert and oriented to person, place, and time.     Cranial Nerves: No cranial nerve deficit.     Deep Tendon Reflexes: Reflexes are normal and symmetric.  Psychiatric:        Behavior: Behavior normal.        Thought Content: Thought content normal.        Judgment: Judgment normal.       BP (!) 148/84   Pulse 71   Temp 98.2 F (36.8 C) (Temporal)   Ht 6' 1"  (1.854 m)   Wt 188 lb 9.6 oz (85.5 kg)   BMI 24.88 kg/m      Assessment & Plan:  Paul Murillo comes in today with chief complaint of Medical Management of Chronic Issues ( mth check up no concerns) and Hypertension   Diagnosis and orders addressed:  1. CAD S/P percutaneous coronary angioplasty - CMP14+EGFR - CBC with Differential/Platelet  2. Essential hypertension - CMP14+EGFR - CBC with Differential/Platelet  3. Gastroesophageal reflux disease, unspecified whether esophagitis present - CMP14+EGFR - CBC with Differential/Platelet  4. Benign prostatic hyperplasia with nocturia - CMP14+EGFR - CBC with Differential/Platelet  5. H/O malignant neoplasm of prostate - CMP14+EGFR - CBC with Differential/Platelet  6. Mixed hyperlipidemia - CMP14+EGFR - CBC  with Differential/Platelet - Lipid panel  7. Vitamin D deficiency - CMP14+EGFR - CBC with Differential/Platelet - VITAMIN D 25 Hydroxy (Vit-D Deficiency, Fractures)  8. Annual physical exam - CMP14+EGFR - CBC with Differential/Platelet - Lipid panel - TSH - VITAMIN D 25 Hydroxy (Vit-D Deficiency, Fractures)   Labs pending Health Maintenance reviewed Diet and exercise encouraged  Follow up plan: 6 months    [name], FNP

## 2020-05-20 ENCOUNTER — Other Ambulatory Visit: Payer: Self-pay | Admitting: Family

## 2020-05-20 LAB — CMP14+EGFR
ALT: 22 IU/L (ref 0–44)
AST: 22 IU/L (ref 0–40)
Albumin/Globulin Ratio: 1.9 (ref 1.2–2.2)
Albumin: 4.5 g/dL (ref 3.8–4.8)
Alkaline Phosphatase: 69 IU/L (ref 48–121)
BUN/Creatinine Ratio: 9 — ABNORMAL LOW (ref 10–24)
BUN: 12 mg/dL (ref 8–27)
Bilirubin Total: 0.5 mg/dL (ref 0.0–1.2)
CO2: 24 mmol/L (ref 20–29)
Calcium: 9.6 mg/dL (ref 8.6–10.2)
Chloride: 101 mmol/L (ref 96–106)
Creatinine, Ser: 1.34 mg/dL — ABNORMAL HIGH (ref 0.76–1.27)
GFR calc Af Amer: 63 mL/min/{1.73_m2} (ref >59)
GFR calc non Af Amer: 55 mL/min/{1.73_m2} — ABNORMAL LOW (ref >59)
Globulin, Total: 2.4 g/dL (ref 1.5–4.5)
Glucose: 85 mg/dL (ref 65–99)
Potassium: 4.4 mmol/L (ref 3.5–5.2)
Sodium: 139 mmol/L (ref 134–144)
Total Protein: 6.9 g/dL (ref 6.0–8.5)

## 2020-05-20 LAB — LIPID PANEL
Chol/HDL Ratio: 3 ratio (ref 0.0–5.0)
Cholesterol, Total: 175 mg/dL (ref 100–199)
HDL: 58 mg/dL (ref >39)
LDL Chol Calc (NIH): 102 mg/dL — ABNORMAL HIGH (ref 0–99)
Triglycerides: 81 mg/dL (ref 0–149)
VLDL Cholesterol Cal: 15 mg/dL (ref 5–40)

## 2020-05-20 LAB — CBC WITH DIFFERENTIAL/PLATELET
Basophils Absolute: 0.1 10*3/uL (ref 0.0–0.2)
Basos: 1 %
EOS (ABSOLUTE): 0.4 10*3/uL (ref 0.0–0.4)
Eos: 4 %
Hematocrit: 41.3 % (ref 37.5–51.0)
Hemoglobin: 13.8 g/dL (ref 13.0–17.7)
Immature Grans (Abs): 0 10*3/uL (ref 0.0–0.1)
Immature Granulocytes: 0 %
Lymphocytes Absolute: 2.6 10*3/uL (ref 0.7–3.1)
Lymphs: 28 %
MCH: 30.7 pg (ref 26.6–33.0)
MCHC: 33.4 g/dL (ref 31.5–35.7)
MCV: 92 fL (ref 79–97)
Monocytes Absolute: 0.6 10*3/uL (ref 0.1–0.9)
Monocytes: 7 %
Neutrophils Absolute: 5.7 10*3/uL (ref 1.4–7.0)
Neutrophils: 60 %
Platelets: 260 10*3/uL (ref 150–450)
RBC: 4.5 x10E6/uL (ref 4.14–5.80)
RDW: 13.7 % (ref 11.6–15.4)
WBC: 9.4 10*3/uL (ref 3.4–10.8)

## 2020-05-20 LAB — TSH: TSH: 6.37 u[IU]/mL — ABNORMAL HIGH (ref 0.450–4.500)

## 2020-05-20 LAB — VITAMIN D 25 HYDROXY (VIT D DEFICIENCY, FRACTURES): Vit D, 25-Hydroxy: 45.9 ng/mL (ref 30.0–100.0)

## 2020-05-20 MED ORDER — LEVOTHYROXINE SODIUM 50 MCG PO TABS
50.0000 ug | ORAL_TABLET | Freq: Every day | ORAL | 11 refills | Status: DC
Start: 2020-05-20 — End: 2020-11-20

## 2020-06-12 ENCOUNTER — Encounter: Payer: Self-pay | Admitting: Family

## 2020-06-12 LAB — IFOBT (OCCULT BLOOD): IFOBT: NEGATIVE

## 2020-08-02 ENCOUNTER — Other Ambulatory Visit: Payer: Self-pay | Admitting: Family

## 2020-09-25 ENCOUNTER — Other Ambulatory Visit: Payer: Self-pay | Admitting: Family

## 2020-09-25 DIAGNOSIS — E559 Vitamin D deficiency, unspecified: Secondary | ICD-10-CM

## 2020-10-29 ENCOUNTER — Other Ambulatory Visit: Payer: Self-pay | Admitting: Family

## 2020-10-31 IMAGING — US US THYROID
1 series · 14 of 25 positions shown · non-contrast
Comparison: None.

CLINICAL DATA: Thyromegaly on exam

EXAM:
THYROID ULTRASOUND
TECHNIQUE: Ultrasound examination of the thyroid gland and adjacent soft
tissues was performed.

[Series 1: us thyroid · 14 of 48 slices shown]
[im 1/48]
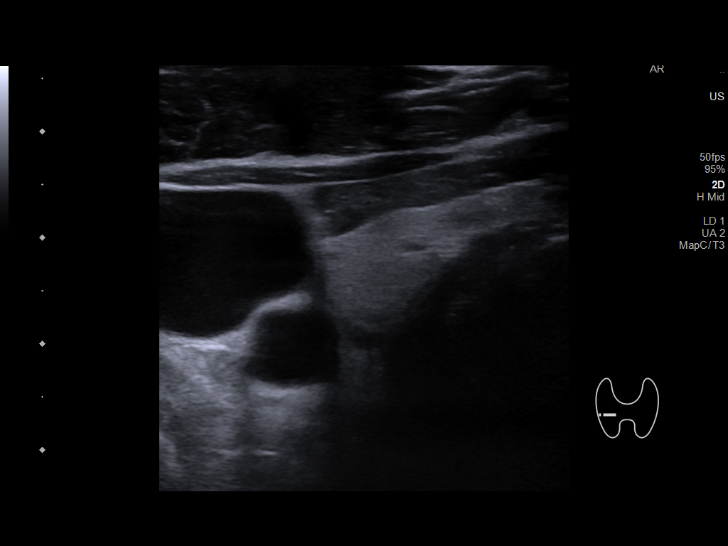
[im 4/48]
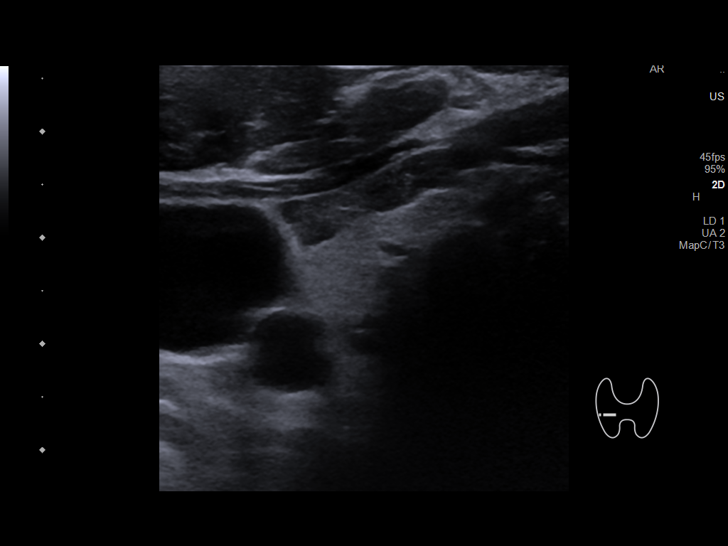
[im 8/48]
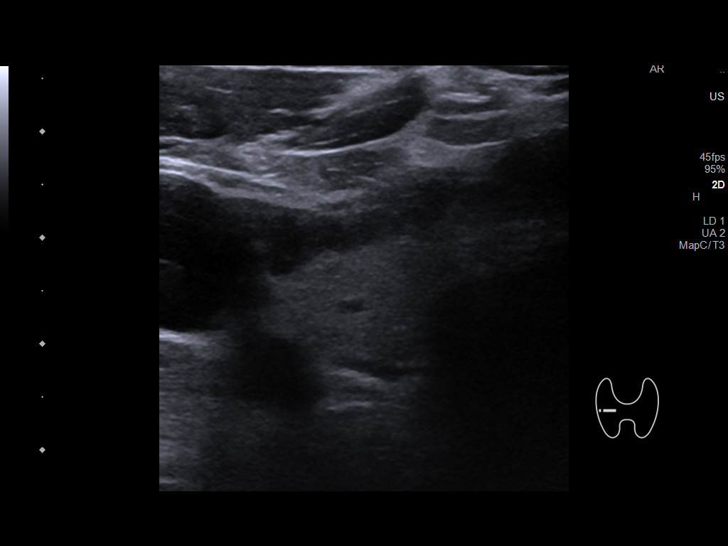
[im 12/48]
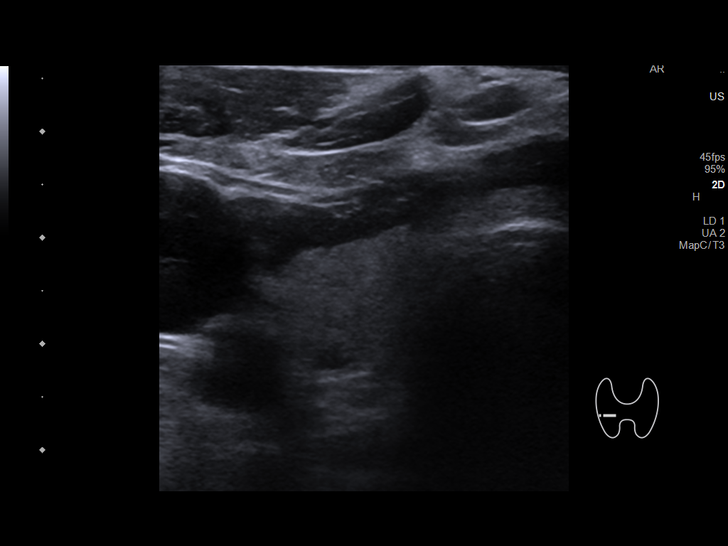
[im 16/48]
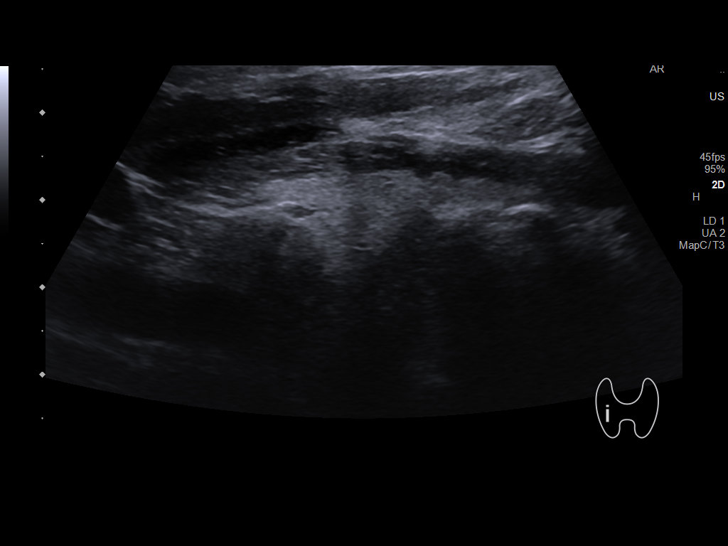
[im 18/48]
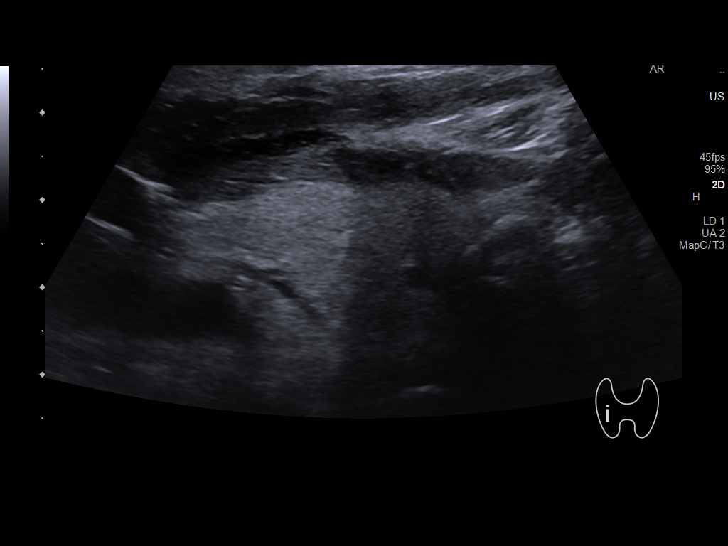
[im 22/48]
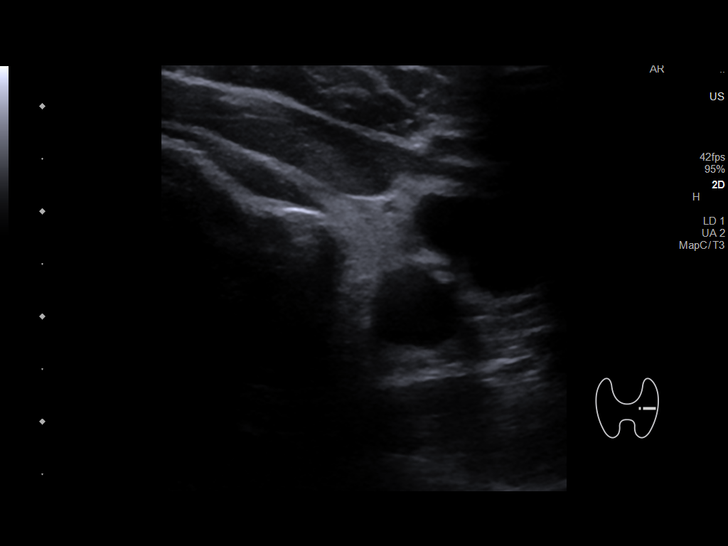
[im 26/48]
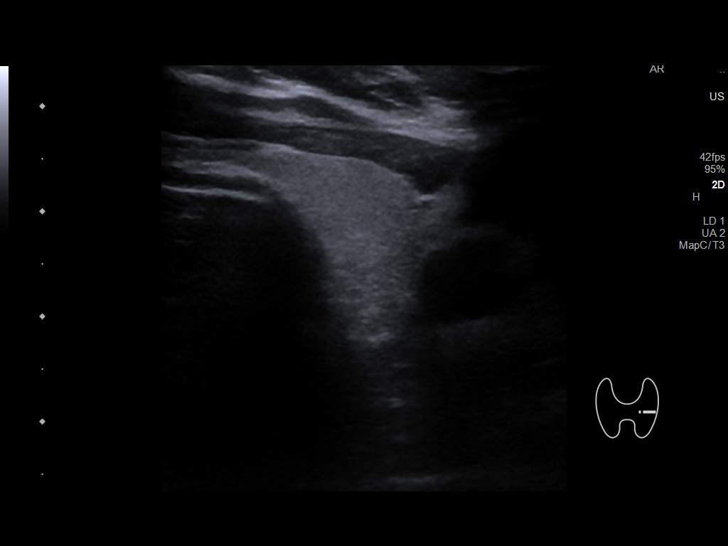
[im 30/48]
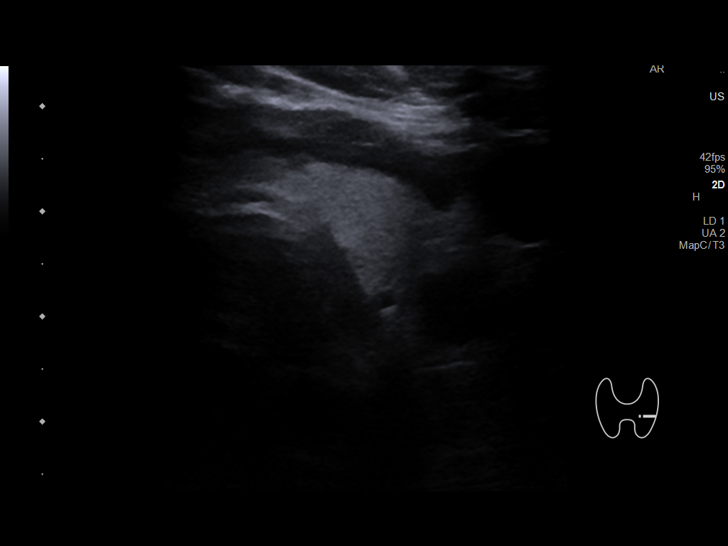
[im 32/48]
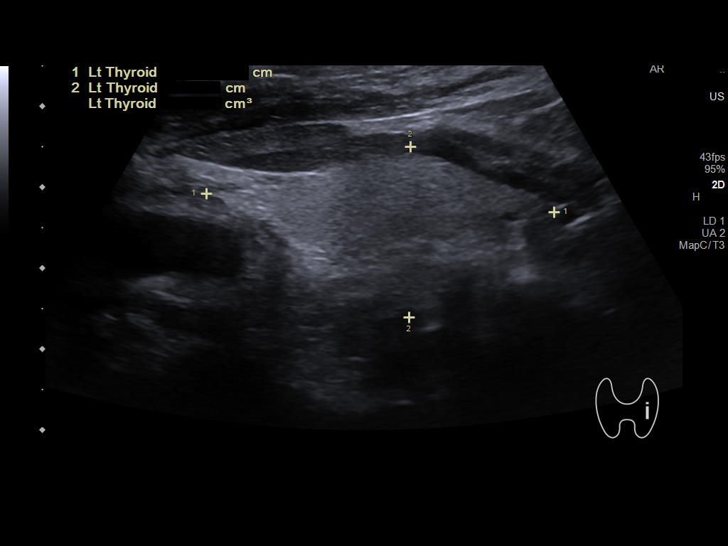
[im 36/48]
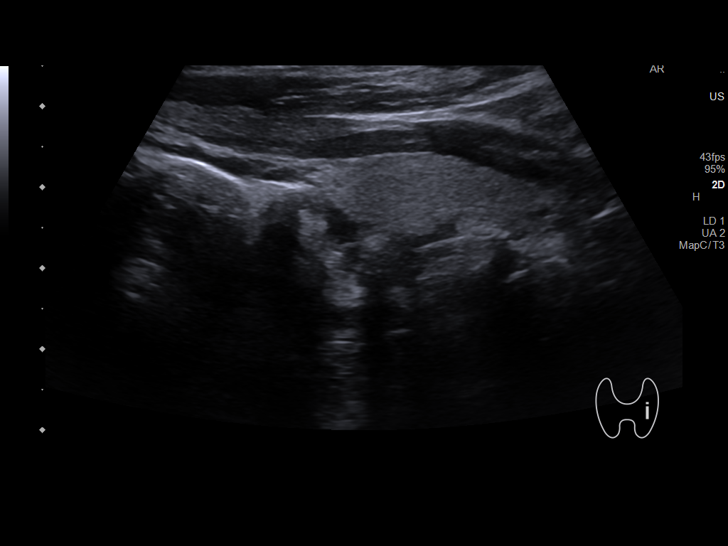
[im 40/48]
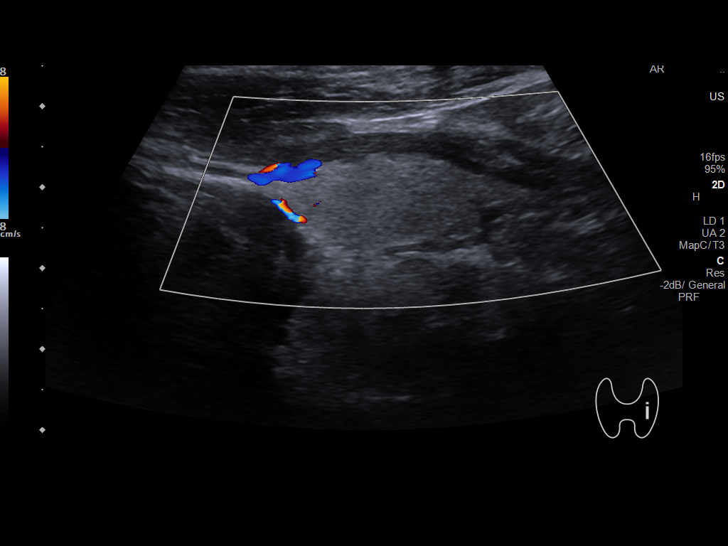
[im 44/48]
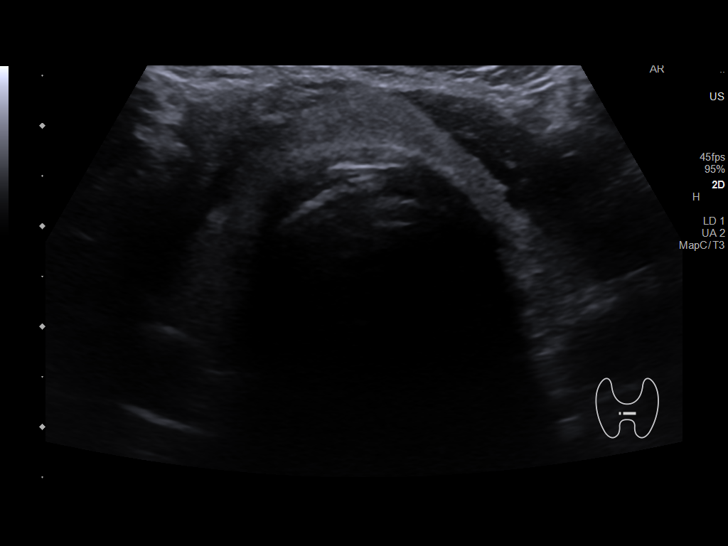
[im 48/48]
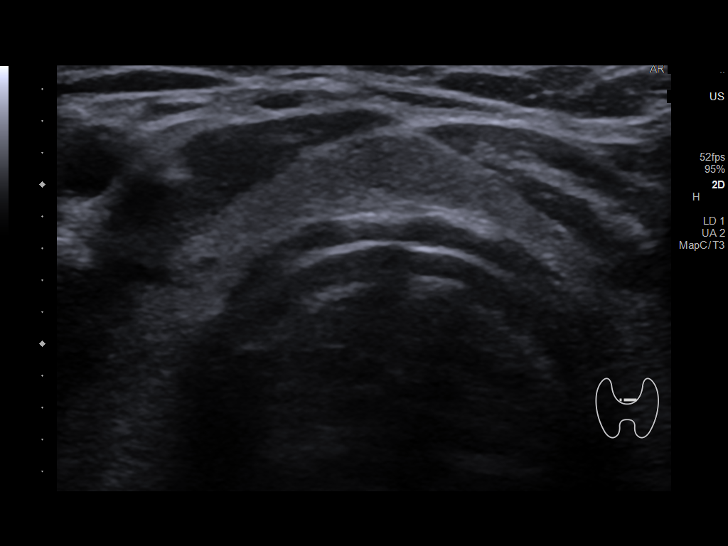

[14 of 25 positions shown; findings below may reference images not displayed]

FINDINGS: Parenchymal Echotexture: Normal

Isthmus: 4 mm

Right lobe: 4.6 x 1.8 x 1.6 cm

Left lobe: 4.3 x 2.1 x 1.3 cm

_________________________________________________________

Estimated total number of nodules >/= 1 cm: 0

Number of spongiform nodules >/=  2 cm not described below (TR1): 0

Number of mixed cystic and solid nodules >/= 1.5 cm not described
below (TR2): 0

_________________________________________________________

No discrete nodules are seen within the thyroid gland.
IMPRESSION: Normal thyroid ultrasound for age

The above is in keeping with the ACR TI-RADS recommendations - [HOSPITAL] 8948;[DATE].

## 2020-11-19 ENCOUNTER — Encounter: Payer: Self-pay | Admitting: Family

## 2020-11-19 ENCOUNTER — Other Ambulatory Visit: Payer: Self-pay

## 2020-11-19 ENCOUNTER — Ambulatory Visit (INDEPENDENT_AMBULATORY_CARE_PROVIDER_SITE_OTHER): Payer: Medicare Other | Admitting: Family

## 2020-11-19 VITALS — BP 115/75 | HR 80 | Temp 97.9°F | Ht 73.0 in | Wt 197.2 lb

## 2020-11-19 DIAGNOSIS — K219 Gastro-esophageal reflux disease without esophagitis: Secondary | ICD-10-CM

## 2020-11-19 DIAGNOSIS — R42 Dizziness and giddiness: Secondary | ICD-10-CM | POA: Diagnosis not present

## 2020-11-19 DIAGNOSIS — I2510 Atherosclerotic heart disease of native coronary artery without angina pectoris: Secondary | ICD-10-CM

## 2020-11-19 DIAGNOSIS — E782 Mixed hyperlipidemia: Secondary | ICD-10-CM | POA: Diagnosis not present

## 2020-11-19 DIAGNOSIS — N529 Male erectile dysfunction, unspecified: Secondary | ICD-10-CM

## 2020-11-19 DIAGNOSIS — E559 Vitamin D deficiency, unspecified: Secondary | ICD-10-CM | POA: Diagnosis not present

## 2020-11-19 DIAGNOSIS — Z9861 Coronary angioplasty status: Secondary | ICD-10-CM

## 2020-11-19 DIAGNOSIS — N401 Enlarged prostate with lower urinary tract symptoms: Secondary | ICD-10-CM

## 2020-11-19 DIAGNOSIS — Z1211 Encounter for screening for malignant neoplasm of colon: Secondary | ICD-10-CM | POA: Diagnosis not present

## 2020-11-19 DIAGNOSIS — Z8546 Personal history of malignant neoplasm of prostate: Secondary | ICD-10-CM

## 2020-11-19 DIAGNOSIS — R351 Nocturia: Secondary | ICD-10-CM

## 2020-11-19 DIAGNOSIS — Z23 Encounter for immunization: Secondary | ICD-10-CM

## 2020-11-19 DIAGNOSIS — I10 Essential (primary) hypertension: Secondary | ICD-10-CM | POA: Diagnosis not present

## 2020-11-19 DIAGNOSIS — Z87891 Personal history of nicotine dependence: Secondary | ICD-10-CM

## 2020-11-19 MED ORDER — VARDENAFIL HCL 20 MG PO TABS
20.0000 mg | ORAL_TABLET | Freq: Every day | ORAL | 2 refills | Status: AC | PRN
Start: 2020-11-19 — End: ?

## 2020-11-19 MED ORDER — VITAMIN D (ERGOCALCIFEROL) 1.25 MG (50000 UNIT) PO CAPS: 50000.0000 [IU] | ORAL_CAPSULE | ORAL | 1 refills | Status: DC

## 2020-11-19 NOTE — Addendum Note (Signed)
Addended by: Ladean Raya on: 11/19/2020 08:59 AM   Modules accepted: Orders

## 2020-11-19 NOTE — Progress Notes (Addendum)
Subjective:    Patient ID: Paul Murillo, male    DOB: 11-30-1955, 65 y.o.   MRN: [id number]  Chief Complaint  Patient presents with  . Medical Management of Chronic Issues  . Hypertension  . Dizziness    Comes and goes    Pt presents to the office today for chronic follow up. Pt is followed by Urologists every 6 months for Prostate Cancer.PT states he is followed by Cardiologists annually for CAD. He states he is doing well and retired 05/08/20.  Hypertension This is a chronic problem. The current episode started more than 1 year ago. The problem has been resolved since onset. The problem is controlled. Pertinent negatives include no malaise/fatigue, peripheral edema or shortness of breath. Risk factors for coronary artery disease include dyslipidemia, obesity, male gender and sedentary lifestyle. The current treatment provides moderate improvement. Hypertensive end-organ damage includes CAD/MI. There is no history of CVA or heart failure.  Dizziness This is a new problem. The current episode started 1 to 4 weeks ago. The problem occurs intermittently.  Gastroesophageal Reflux He complains of belching and heartburn. This is a chronic problem. The current episode started more than 1 year ago. The problem occurs occasionally. He has tried an antacid for the symptoms. The treatment provided moderate relief.  Benign Prostatic Hypertrophy This is a chronic problem. The current episode started more than 1 year ago. Irritative symptoms include nocturia (2).  Hyperlipidemia This is a chronic problem. The current episode started more than 1 year ago. The problem is controlled. Pertinent negatives include no shortness of breath. Current antihyperlipidemic treatment includes statins. The current treatment provides moderate improvement of lipids. Risk factors for coronary artery disease include dyslipidemia, hypertension and a sedentary lifestyle.      Review of Systems  Constitutional: Negative  for malaise/fatigue.  Respiratory: Negative for shortness of breath.   Gastrointestinal: Positive for heartburn.  Genitourinary: Positive for nocturia (2).  Neurological: Positive for dizziness.  All other systems reviewed and are negative.      Objective:   Physical Exam Vitals reviewed.  Constitutional:      General: He is not in acute distress.    Appearance: He is well-developed and well-nourished.  HENT:     Head: Normocephalic.     Right Ear: Tympanic membrane normal.     Left Ear: Tympanic membrane normal.     Mouth/Throat:     Mouth: Oropharynx is clear and moist.  Eyes:     General:        Right eye: No discharge.        Left eye: No discharge.     Pupils: Pupils are equal, round, and reactive to light.  Neck:     Thyroid: No thyromegaly.  Cardiovascular:     Rate and Rhythm: Normal rate and regular rhythm.     Pulses: Intact distal pulses.     Heart sounds: Normal heart sounds. No murmur heard.   Pulmonary:     Effort: Pulmonary effort is normal. No respiratory distress.     Breath sounds: Normal breath sounds. No wheezing.  Abdominal:     General: Bowel sounds are normal. There is no distension.     Palpations: Abdomen is soft.     Tenderness: There is no abdominal tenderness.  Musculoskeletal:        General: No tenderness or edema. Normal range of motion.     Cervical back: Normal range of motion and neck supple.  Skin:    General:  Skin is warm and dry.     Findings: No erythema or rash.  Neurological:     Mental Status: He is alert and oriented to person, place, and time.     Cranial Nerves: No cranial nerve deficit.     Deep Tendon Reflexes: Reflexes are normal and symmetric.  Psychiatric:        Mood and Affect: Mood and affect normal.        Behavior: Behavior normal.        Thought Content: Thought content normal.        Judgment: Judgment normal.       BP 115/75 Comment: Patient reports at home  Pulse 80   Temp 97.9 F (36.6 C)  (Temporal)   Ht 6' 1"  (1.854 m)   Wt 197 lb 3.2 oz (89.4 kg)   SpO2 100%   BMI 26.02 kg/m      Assessment & Plan:  Paul Murillo comes in today with chief complaint of Medical Management of Chronic Issues, Hypertension, and Dizziness (Comes and goes )   Diagnosis and orders addressed:  1. Erectile dysfunction, unspecified erectile dysfunction type - vardenafil (LEVITRA) 20 MG tablet; Take 1 tablet (20 mg total) by mouth daily as needed for erectile dysfunction.  Dispense: 10 tablet; Refill: 2 - CMP14+EGFR - CBC with Differential/Platelet  2. Vitamin D deficiency - Vitamin D, Ergocalciferol, (DRISDOL) 1.25 MG (50000 UNIT) CAPS capsule; Take 1 capsule (50,000 Units total) by mouth every 7 (seven) days.  Dispense: 12 capsule; Refill: 1 - CMP14+EGFR - CBC with Differential/Platelet  3. Primary hypertension - CMP14+EGFR - CBC with Differential/Platelet  4. CAD S/P percutaneous coronary angioplasty - CMP14+EGFR - CBC with Differential/Platelet - Ambulatory referral to Cardiology  5. Gastroesophageal reflux disease, unspecified whether esophagitis present - CMP14+EGFR - CBC with Differential/Platelet  6. Benign prostatic hyperplasia with nocturia - CMP14+EGFR - CBC with Differential/Platelet  7. Mixed hyperlipidemia - CMP14+EGFR - CBC with Differential/Platelet - Ambulatory referral to Cardiology  8. H/O malignant neoplasm of prostate - CMP14+EGFR - CBC with Differential/Platelet  9. Hx of smoking  - CMP14+EGFR - CBC with Differential/Platelet  10. Colon cancer screening - Cologuard - CMP14+EGFR - CBC with Differential/Platelet  11. Dizzinesses - CMP14+EGFR - CBC with Differential/Platelet - TSH - Ambulatory referral to Cardiology   Labs pending Health Maintenance reviewed Diet and exercise encouraged  Follow up plan: 6 months    [name], FNP

## 2020-11-19 NOTE — Patient Instructions (Signed)
Dizziness Dizziness is a common problem. It is a feeling of unsteadiness or light-headedness. You may feel like you are about to faint. Dizziness can lead to injury if you stumble or fall. Anyone can become dizzy, but dizziness is more common in older adults. This condition can be caused by a number of things, including medicines, dehydration, or illness. Follow these instructions at home: Eating and drinking  Drink enough fluid to keep your urine clear or pale yellow. This helps to keep you from becoming dehydrated. Try to drink more clear fluids, such as water.  Do not drink alcohol.  Limit your caffeine intake if told to do so by your health care provider. Check ingredients and nutrition facts to see if a food or beverage contains caffeine.  Limit your salt (sodium) intake if told to do so by your health care provider. Check ingredients and nutrition facts to see if a food or beverage contains sodium. Activity  Avoid making quick movements. ? Rise slowly from chairs and steady yourself until you feel okay. ? In the morning, first sit up on the side of the bed. When you feel okay, stand slowly while you hold onto something until you know that your balance is fine.  If you need to stand in one place for a long time, move your legs often. Tighten and relax the muscles in your legs while you are standing.  Do not drive or use heavy machinery if you feel dizzy.  Avoid bending down if you feel dizzy. Place items in your home so that they are easy for you to reach without leaning over. Lifestyle  Do not use any products that contain nicotine or tobacco, such as cigarettes and e-cigarettes. If you need help quitting, ask your health care provider.  Try to reduce your stress level by using methods such as yoga or meditation. Talk with your health care provider if you need help to manage your stress. General instructions  Watch your dizziness for any changes.  Take over-the-counter and  prescription medicines only as told by your health care provider. Talk with your health care provider if you think that your dizziness is caused by a medicine that you are taking.  Tell a friend or a family member that you are feeling dizzy. If he or she notices any changes in your behavior, have this person call your health care provider.  Keep all follow-up visits as told by your health care provider. This is important. Contact a health care provider if:  Your dizziness does not go away.  Your dizziness or light-headedness gets worse.  You feel nauseous.  You have reduced hearing.  You have new symptoms.  You are unsteady on your feet or you feel like the room is spinning. Get help right away if:  You vomit or have diarrhea and are unable to eat or drink anything.  You have problems talking, walking, swallowing, or using your arms, hands, or legs.  You feel generally weak.  You are not thinking clearly or you have trouble forming sentences. It may take a friend or family member to notice this.  You have chest pain, abdominal pain, shortness of breath, or sweating.  Your vision changes.  You have any bleeding.  You have a severe headache.  You have neck pain or a stiff neck.  You have a fever. These symptoms may represent a serious problem that is an emergency. Do not wait to see if the symptoms will go away. Get medical help   right away. Call your local emergency services (911 in the [place] Do not drive yourself to the hospital. Summary  Dizziness is a feeling of unsteadiness or light-headedness. This condition can be caused by a number of things, including medicines, dehydration, or illness.  Anyone can become dizzy, but dizziness is more common in older adults.  Drink enough fluid to keep your urine clear or pale yellow. Do not drink alcohol.  Avoid making quick movements if you feel dizzy. Monitor your dizziness for any changes. This information is not intended to  replace advice given to you by your health care provider. Make sure you discuss any questions you have with your health care provider. Document Revised: 10/13/2017 Document Reviewed: 11/12/2016 Elsevier Patient Education  2021 Elsevier Inc.  

## 2020-11-20 ENCOUNTER — Other Ambulatory Visit: Payer: Self-pay | Admitting: Family

## 2020-11-20 LAB — CMP14+EGFR
ALT: 31 IU/L (ref 0–44)
AST: 26 IU/L (ref 0–40)
Albumin/Globulin Ratio: 1.7 (ref 1.2–2.2)
Albumin: 4.5 g/dL (ref 3.8–4.8)
Alkaline Phosphatase: 76 IU/L (ref 44–121)
BUN/Creatinine Ratio: 8 — ABNORMAL LOW (ref 10–24)
BUN: 10 mg/dL (ref 8–27)
Bilirubin Total: 0.4 mg/dL (ref 0.0–1.2)
CO2: 22 mmol/L (ref 20–29)
Calcium: 10.1 mg/dL (ref 8.6–10.2)
Chloride: 104 mmol/L (ref 96–106)
Creatinine, Ser: 1.27 mg/dL (ref 0.76–1.27)
GFR calc Af Amer: 68 mL/min/{1.73_m2} (ref >59)
GFR calc non Af Amer: 58 mL/min/{1.73_m2} — ABNORMAL LOW (ref >59)
Globulin, Total: 2.7 g/dL (ref 1.5–4.5)
Glucose: 99 mg/dL (ref 65–99)
Potassium: 4.5 mmol/L (ref 3.5–5.2)
Sodium: 143 mmol/L (ref 134–144)
Total Protein: 7.2 g/dL (ref 6.0–8.5)

## 2020-11-20 LAB — CBC WITH DIFFERENTIAL/PLATELET
Basophils Absolute: 0.1 10*3/uL (ref 0.0–0.2)
Basos: 1 %
EOS (ABSOLUTE): 0.4 10*3/uL (ref 0.0–0.4)
Eos: 4 %
Hematocrit: 41.5 % (ref 37.5–51.0)
Hemoglobin: 14 g/dL (ref 13.0–17.7)
Immature Grans (Abs): 0 10*3/uL (ref 0.0–0.1)
Immature Granulocytes: 0 %
Lymphocytes Absolute: 3.2 10*3/uL — ABNORMAL HIGH (ref 0.7–3.1)
Lymphs: 33 %
MCH: 31 pg (ref 26.6–33.0)
MCHC: 33.7 g/dL (ref 31.5–35.7)
MCV: 92 fL (ref 79–97)
Monocytes Absolute: 0.7 10*3/uL (ref 0.1–0.9)
Monocytes: 7 %
Neutrophils Absolute: 5.3 10*3/uL (ref 1.4–7.0)
Neutrophils: 55 %
Platelets: 262 10*3/uL (ref 150–450)
RBC: 4.52 x10E6/uL (ref 4.14–5.80)
RDW: 13.9 % (ref 11.6–15.4)
WBC: 9.5 10*3/uL (ref 3.4–10.8)

## 2020-11-20 LAB — TSH: TSH: 5.19 u[IU]/mL — ABNORMAL HIGH (ref 0.450–4.500)

## 2020-11-20 MED ORDER — LEVOTHYROXINE SODIUM 75 MCG PO TABS: 75.0000 ug | ORAL_TABLET | Freq: Every day | ORAL | 1 refills | Status: DC

## 2020-11-24 ENCOUNTER — Ambulatory Visit (INDEPENDENT_AMBULATORY_CARE_PROVIDER_SITE_OTHER): Payer: Medicare Other | Admitting: *Deleted

## 2020-11-24 DIAGNOSIS — Z0000 Encounter for general adult medical examination without abnormal findings: Secondary | ICD-10-CM | POA: Diagnosis not present

## 2020-11-24 NOTE — Patient Instructions (Signed)
  [place] Maintenance Summary and Written Plan of Care  Mr. Paul Murillo ,  Thank you for allowing me to perform your Medicare Annual Wellness Visit and for your ongoing commitment to your health.   Health Maintenance & Immunization History Health Maintenance  Topic Date Due  . Fecal DNA (Cologuard)  Never done  . TETANUS/TDAP  05/19/2021 (Originally 01/05/2020)  . COVID-19 Vaccine (4 - Booster for Pfizer series) 02/06/2021  . INFLUENZA VACCINE  Completed  . Hepatitis C Screening  Completed  . PNA vac Low Risk Adult  Completed   Immunization History  Administered Date(s) Administered  . DTaP 01/04/2010  . Influenza Whole 08/12/2008  . Influenza, High Dose Seasonal PF 08/01/2019  . Influenza,inj,Quad PF,6+ Mos 08/05/2014, 07/30/2015, 08/25/2016, 08/16/2017, 08/10/2018  . Influenza-Unspecified 08/08/2018, 08/08/2020  . PFIZER(Purple Top)SARS-COV-2 Vaccination 01/04/2020, 01/25/2020, 08/08/2020  . Pneumococcal Conjugate-13 11/19/2020  . Pneumococcal Polysaccharide-23 05/16/2019  . Td 01/04/2010  . Tdap 01/04/2010  . Zoster 02/22/2016    These are the patient goals that we discussed: Goals Addressed            This Visit's Progress   . AWV       11/24/2020 AWV Goal: Exercise for General Health   Patient will verbalize understanding of the benefits of increased physical activity:  Exercising regularly is important. It will improve your overall fitness, flexibility, and endurance.  Regular exercise also will improve your overall health. It can help you control your weight, reduce stress, and improve your bone density.  Over the next year, patient will increase physical activity as tolerated with a goal of at least 150 minutes of moderate physical activity per week.   You can tell that you are exercising at a moderate intensity if your heart starts beating faster and you start breathing faster but can still hold a conversation.  Moderate-intensity  exercise ideas include:  Walking 1 mile (1.6 km) in about 15 minutes  Biking  Hiking  Golfing  Dancing  Water aerobics  Patient will verbalize understanding of everyday activities that increase physical activity by providing examples like the following: ? Yard work, such as: ? Pushing a Conservation officer, nature ? Raking and bagging leaves ? Washing your car ? Pushing a stroller ? Shoveling snow ? Gardening ? Washing windows or floors  Patient will be able to explain general safety guidelines for exercising:   Before you start a new exercise program, talk with your health care provider.  Do not exercise so much that you hurt yourself, feel dizzy, or get very short of breath.  Wear comfortable clothes and wear shoes with good support.  Drink plenty of water while you exercise to prevent dehydration or heat stroke.  Work out until your breathing and your heartbeat get faster.         This is a list of Health Maintenance Items that are overdue or due now: Health Maintenance Due  Topic Date Due  . Fecal DNA (Cologuard)  Never done     Orders/Referrals Placed Today: No orders of the defined types were placed in this encounter.  (Contact our referral department at [phone] if you have not spoken with someone about your referral appointment within the next 5 days)    Follow-up Plan . Follow-up with Sharion Balloon, FNP as planned . Complete cologuard once you receive it in the mail. If you do not receive within two weeks please let us know.

## 2020-11-24 NOTE — Progress Notes (Signed)
MEDICARE ANNUAL WELLNESS VISIT  11/24/2020  Telephone Visit Disclaimer This Medicare AWV was conducted by telephone due to national recommendations for restrictions regarding the COVID-19 Pandemic (e.g. social distancing).  I verified, using two identifiers, that I am speaking with Paul Murillo or their authorized healthcare agent. I discussed the limitations, risks, security, and privacy concerns of performing an evaluation and management service by telephone and the potential availability of an in-person appointment in the future. The patient expressed understanding and agreed to proceed.  Location of Patient: Home  Location of Provider (nurse):  [facility]  Subjective:    Paul Murillo is a 65 y.o. male patient of Hawks, Theador Hawthorne, FNP who had a Medicare Annual Wellness Visit today via telephone. Paul Murillo is Retired and lives with their spouse. He has 2 children. He reports that he is socially active and does interact with friends/family regularly. He is not physically active and enjoys being outside when the weather is warmer.  Patient Care Team: Sharion Balloon, FNP as PCP - General (Family Medicine) Ceasar Mons, MD as Consulting Physician (Urology)  Advanced Directives 11/24/2020 03/20/2018 11/18/2016 07/06/2016 01/18/2016 01/29/2014  Does Patient Have a Medical Advance Directive? Yes No No No No Patient does not have advance directive  Type of Advance Directive [place]  Does patient want to make changes to medical advance directive? No - Patient declined - - - - -  Copy of [place] in Chart? No - copy requested - - - - -  Would patient like information on creating a medical advance directive? - - No - Patient declined No - patient declined information No - patient declined information -    Hospital Utilization Over the Past 12 Months: # of hospitalizations or ER visits: 0 # of surgeries:  0  Review of Systems    Patient reports that his overall health is unchanged compared to last year.  History obtained from chart review and the patient  Patient Reported Readings (BP, Pulse, CBG, Weight, etc) none  Pain Assessment Pain : No/denies pain     Current Medications & Allergies (verified) Allergies as of 11/24/2020   No Known Allergies     Medication List       Accurate as of November 24, 2020  3:45 PM. If you have any questions, ask your nurse or doctor.        alfuzosin 10 MG 24 hr tablet Commonly known as: UROXATRAL   amLODipine 5 MG tablet Commonly known as: NORVASC Take 1 tablet (5 mg total) by mouth daily.   aspirin 81 MG EC tablet Take 81 mg by mouth daily.   atorvastatin 80 MG tablet Commonly known as: LIPITOR Take 1 tablet (80 mg total) by mouth daily.   levothyroxine 75 MCG tablet Commonly known as: Synthroid Take 1 tablet (75 mcg total) by mouth daily.   nitroGLYCERIN 0.4 MG SL tablet Commonly known as: NITROSTAT Place 1 tablet (0.4 mg total) under the tongue every 5 (five) minutes as needed for chest pain. Reported on 04/01/2016   omeprazole 20 MG capsule Commonly known as: PRILOSEC PLEASE SPECIFY DIRECTIONS, REFILLS AND QUANTITY   oxybutynin 10 MG 24 hr tablet Commonly known as: DITROPAN-XL   tamsulosin 0.4 MG Caps capsule Commonly known as: FLOMAX TAKE 1 CAPSULE BY MOUTH EVERY DAY   telmisartan 80 MG tablet Commonly known as: MICARDIS Take 1 tablet (80 mg total) by mouth daily.  vardenafil 20 MG tablet Commonly known as: LEVITRA Take 1 tablet (20 mg total) by mouth daily as needed for erectile dysfunction.   Vitamin D (Ergocalciferol) 1.25 MG (50000 UNIT) Caps capsule Commonly known as: DRISDOL Take 1 capsule (50,000 Units total) by mouth every 7 (seven) days.       History (reviewed): Past Medical History:  Diagnosis Date  . Cancer [facility])    Prostate  . Coronary artery disease   . ED (erectile dysfunction)   .  Genitourinary disorder   . Headache   . Hypercholesteremia   . Hypertension   . MI (myocardial infarction) ([place])   . Reflux   . Syncope   . Vitamin D deficiency    Past Surgical History:  Procedure Laterality Date  . APPENDECTOMY    . HERNIA REPAIR    . LEFT HEART CATHETERIZATION WITH CORONARY ANGIOGRAM N/A 01/29/2014   Procedure: LEFT HEART CATHETERIZATION WITH CORONARY ANGIOGRAM;  Surgeon: Peter M Martinique, MD;  Location: [facility] CATH LAB;  Service: Cardiovascular;  Laterality: N/A;  . TOTAL HIP ARTHROPLASTY     Family History  Problem Relation Age of Onset  . Heart disease Father   . Hypertension Father   . Diabetes Brother   . Cancer Neg Hx    Social History   Socioeconomic History  . Marital status: Married    Spouse name: Not on file  . Number of children: 2  . Years of education: Not on file  . Highest education level: 12th grade  Occupational History  . Occupation: Retired  Tobacco Use  . Smoking status: Former Smoker    Types: Cigarettes    Quit date: 05/08/2020    Years since quitting: 0.5  . Smokeless tobacco: Never Used  Vaping Use  . Vaping Use: Never used  Substance and Sexual Activity  . Alcohol use: Yes    Alcohol/week: 0.0 standard drinks    Comment: occ.  . Drug use: No  . Sexual activity: Yes  Other Topics Concern  . Not on file  Social History Narrative  . Not on file   Social Determinants of Health   Financial Resource Strain: Not on file  Food Insecurity: Not on file  Transportation Needs: Not on file  Physical Activity: Not on file  Stress: Not on file  Social Connections: Not on file    Activities of Daily Living In your present state of health, do you have any difficulty performing the following activities: 11/24/2020  Hearing? N  Vision? N  Comment wears glasses  Difficulty concentrating or making decisions? N  Walking or climbing stairs? N  Dressing or bathing? N  Doing errands, shopping? N  Preparing Food and eating ? N  Using  the Toilet? N  In the past six months, have you accidently leaked urine? Y  Comment He is currently seeing urologist  Do you have problems with loss of bowel control? N  Managing your Medications? N  Managing your Finances? N  Housekeeping or managing your Housekeeping? N  Some recent data might be hidden    Patient Education/ Literacy How often do you need to have someone help you when you read instructions, pamphlets, or other written materials from your doctor or pharmacy?: 1 - Never What is the last grade level you completed in school?: 12th  Exercise Current Exercise Habits: The patient does not participate in regular exercise at present  Diet Patient reports consuming 1-2 meals a day and 1 snack(s) a day Patient reports that  his primary diet is: Regular Patient reports that she does have regular access to food.   Depression Screen PHQ 2/9 Scores 11/24/2020 11/19/2020 05/19/2020 11/18/2019 05/16/2019 11/15/2018 05/14/2018  PHQ - 2 Score 0 0 0 0 0 0 0  PHQ- 9 Score - - - - - - -     Fall Risk Fall Risk  11/24/2020 11/19/2020 05/19/2020 11/18/2019 05/16/2019  Falls in the past year? 0 0 0 0 0  Number falls in past yr: - - - - -  Comment - - - - -  Injury with Fall? - - - - -     Objective:  Paul Murillo seemed alert and oriented and he participated appropriately during our telephone visit.  Blood Pressure Weight BMI  BP Readings from Last 3 Encounters:  11/19/20 115/75  05/19/20 (!) 140/87  11/18/19 125/81   Wt Readings from Last 3 Encounters:  11/19/20 197 lb 3.2 oz (89.4 kg)  05/19/20 188 lb 9.6 oz (85.5 kg)  11/18/19 187 lb 3.2 oz (84.9 kg)   BMI Readings from Last 1 Encounters:  11/19/20 26.02 kg/m    *Unable to obtain current vital signs, weight, and BMI due to telephone visit type  Hearing/Vision  . [place] did not seem to have difficulty with hearing/understanding during the telephone conversation . Reports that he has had a formal eye exam by an eye care  professional within the past year . Reports that he has not had a formal hearing evaluation within the past year *Unable to fully assess hearing and vision during telephone visit type  Cognitive Function: No flowsheet data found. (Normal:0-7, Significant for Dysfunction: >8)  Normal Cognitive Function Screening: Yes   Immunization & Health Maintenance Record Immunization History  Administered Date(s) Administered  . DTaP 01/04/2010  . Influenza Whole 08/12/2008  . Influenza, High Dose Seasonal PF 08/01/2019  . Influenza,inj,Quad PF,6+ Mos 08/05/2014, 07/30/2015, 08/25/2016, 08/16/2017, 08/10/2018  . Influenza-Unspecified 08/08/2018, 08/08/2020  . PFIZER(Purple Top)SARS-COV-2 Vaccination 01/04/2020, 01/25/2020, 08/08/2020  . Pneumococcal Conjugate-13 11/19/2020  . Pneumococcal Polysaccharide-23 05/16/2019  . Td 01/04/2010  . Tdap 01/04/2010  . Zoster 02/22/2016    Health Maintenance  Topic Date Due  . Fecal DNA (Cologuard)  Never done  . TETANUS/TDAP  05/19/2021 (Originally 01/05/2020)  . COVID-19 Vaccine (4 - Booster for Pfizer series) 02/06/2021  . INFLUENZA VACCINE  Completed  . Hepatitis C Screening  Completed  . PNA vac Low Risk Adult  Completed       Assessment  This is a routine wellness examination for Giovani Neumeister.  Health Maintenance: Due or Overdue Health Maintenance Due  Topic Date Due  . Fecal DNA (Cologuard)  Never done    Paul Murillo does not need a referral for Community Assistance: Care Management:   no Social Work:    no Prescription Assistance:  no Nutrition/Diabetes Education:  no   Plan:  Personalized Goals Goals Addressed            This Visit's Progress   . AWV       11/24/2020 AWV Goal: Exercise for General Health   Patient will verbalize understanding of the benefits of increased physical activity:  Exercising regularly is important. It will improve your overall fitness, flexibility, and endurance.  Regular exercise also will  improve your overall health. It can help you control your weight, reduce stress, and improve your bone density.  Over the next year, patient will increase physical activity as tolerated with a goal of at least 150 minutes  of moderate physical activity per week.   You can tell that you are exercising at a moderate intensity if your heart starts beating faster and you start breathing faster but can still hold a conversation.  Moderate-intensity exercise ideas include:  Walking 1 mile (1.6 km) in about 15 minutes  Biking  Hiking  Golfing  Dancing  Water aerobics  Patient will verbalize understanding of everyday activities that increase physical activity by providing examples like the following: ? Yard work, such as: ? Pushing a Conservation officer, nature ? Raking and bagging leaves ? Washing your car ? Pushing a stroller ? Shoveling snow ? Gardening ? Washing windows or floors  Patient will be able to explain general safety guidelines for exercising:   Before you start a new exercise program, talk with your health care provider.  Do not exercise so much that you hurt yourself, feel dizzy, or get very short of breath.  Wear comfortable clothes and wear shoes with good support.  Drink plenty of water while you exercise to prevent dehydration or heat stroke.  Work out until your breathing and your heartbeat get faster.       Personalized Health Maintenance & Screening Recommendations  Colorectal cancer screening- Patient is waiting on Cologuard to come in the mail.  Lung Cancer Screening Recommended: no (Low Dose CT Chest recommended if Age 57-80 years, 30 pack-year currently smoking OR have quit w/in past 15 years) Hepatitis C Screening recommended: no HIV Screening recommended: no  Advanced Directives: Written information was not prepared per patient's request.  Referrals & Orders No orders of the defined types were placed in this encounter.   Follow-up Plan . Follow-up with  Sharion Balloon, FNP as planned . Complete cologuard once you receive it in the mail. If you do not receive within two weeks please let us know.  . AVS printed and mailed to patient    I have personally reviewed and noted the following in the patient's chart:   . Medical and social history . Use of alcohol, tobacco or illicit drugs  . Current medications and supplements . Functional ability and status . Nutritional status . Physical activity . Advanced directives . List of other physicians . Hospitalizations, surgeries, and ER visits in previous 12 months . Vitals . Screenings to include cognitive, depression, and falls . Referrals and appointments  In addition, I have reviewed and discussed with Paul Murillo certain preventive protocols, quality metrics, and best practice recommendations. A written personalized care plan for preventive services as well as general preventive health recommendations is available and can be mailed to the patient at his request.      Lynnea Ferrier, LPN  [phone]

## 2020-12-10 DIAGNOSIS — Z1211 Encounter for screening for malignant neoplasm of colon: Secondary | ICD-10-CM | POA: Diagnosis not present

## 2020-12-17 LAB — EXTERNAL GENERIC LAB PROCEDURE: COLOGUARD: NEGATIVE

## 2020-12-17 LAB — COLOGUARD: COLOGUARD: NEGATIVE

## 2020-12-25 DIAGNOSIS — I2510 Atherosclerotic heart disease of native coronary artery without angina pectoris: Secondary | ICD-10-CM | POA: Diagnosis not present

## 2020-12-25 DIAGNOSIS — R9431 Abnormal electrocardiogram [ECG] [EKG]: Secondary | ICD-10-CM | POA: Diagnosis not present

## 2020-12-25 DIAGNOSIS — E7800 Pure hypercholesterolemia, unspecified: Secondary | ICD-10-CM | POA: Diagnosis not present

## 2020-12-25 DIAGNOSIS — R0600 Dyspnea, unspecified: Secondary | ICD-10-CM | POA: Diagnosis not present

## 2020-12-25 DIAGNOSIS — E782 Mixed hyperlipidemia: Secondary | ICD-10-CM | POA: Diagnosis not present

## 2020-12-25 DIAGNOSIS — I252 Old myocardial infarction: Secondary | ICD-10-CM | POA: Diagnosis not present

## 2020-12-25 DIAGNOSIS — R55 Syncope and collapse: Secondary | ICD-10-CM | POA: Diagnosis not present

## 2020-12-25 DIAGNOSIS — Z9861 Coronary angioplasty status: Secondary | ICD-10-CM | POA: Diagnosis not present

## 2020-12-25 DIAGNOSIS — I10 Essential (primary) hypertension: Secondary | ICD-10-CM | POA: Diagnosis not present

## 2020-12-25 DIAGNOSIS — Z720 Tobacco use: Secondary | ICD-10-CM | POA: Diagnosis not present

## 2020-12-25 DIAGNOSIS — R42 Dizziness and giddiness: Secondary | ICD-10-CM | POA: Diagnosis not present

## 2020-12-25 LAB — COLOGUARD: Cologuard: NEGATIVE

## 2021-01-18 ENCOUNTER — Ambulatory Visit: Payer: Medicare Other | Admitting: Family

## 2021-01-25 ENCOUNTER — Other Ambulatory Visit: Payer: Self-pay | Admitting: Family

## 2021-02-01 ENCOUNTER — Other Ambulatory Visit: Payer: Self-pay

## 2021-02-01 ENCOUNTER — Encounter: Payer: Self-pay | Admitting: Family

## 2021-02-01 ENCOUNTER — Ambulatory Visit (INDEPENDENT_AMBULATORY_CARE_PROVIDER_SITE_OTHER): Payer: Medicare Other | Admitting: Family

## 2021-02-01 VITALS — BP 140/82 | HR 75 | Temp 98.6°F | Ht 73.0 in | Wt 193.4 lb

## 2021-02-01 DIAGNOSIS — E039 Hypothyroidism, unspecified: Secondary | ICD-10-CM | POA: Insufficient documentation

## 2021-02-01 DIAGNOSIS — R55 Syncope and collapse: Secondary | ICD-10-CM

## 2021-02-01 DIAGNOSIS — R6889 Other general symptoms and signs: Secondary | ICD-10-CM | POA: Diagnosis not present

## 2021-02-01 NOTE — Progress Notes (Signed)
Subjective:    Patient ID: Paul Murillo, male    DOB: [birth year], 65 y.o.   MRN: [id number]  Chief Complaint  Patient presents with  . Hypothyroidism    2 mth rck   . Loss of Consciousness    01/11/21 is the last one that happened. 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 He was seen on 11/19/20 and we increased his levothyroxine to 75 mcg from 50 mcg.  He states while he was at work, he was working on a machine and got "really hot" and then states he woke up on the floor. He reports this has helped several times throughout the years. He has a Cardiologists appt on 04/28 for stress test.  Loss of Consciousness This is a recurrent problem. He lost consciousness for a period of less than 1 minute. Pertinent negatives include no fever, malaise/fatigue or palpitations. He has tried bed rest for the symptoms. The treatment provided mild relief.  Thyroid Problem Presents for follow-up visit. Patient reports no anxiety, constipation, diarrhea, fatigue or palpitations. The symptoms have been stable.      Review of Systems  Constitutional: Negative for fatigue, fever and malaise/fatigue.  Cardiovascular: Positive for syncope. Negative for palpitations.  Gastrointestinal: Negative for constipation and diarrhea.  Psychiatric/Behavioral: The patient is not nervous/anxious.   All other systems reviewed and are negative.      Objective:   Physical Exam Vitals reviewed.  Constitutional:      General: He is not in acute distress.    Appearance: He is well-developed.  HENT:     Head: Normocephalic.     Right Ear: Tympanic membrane normal.     Left Ear: Tympanic membrane normal.  Eyes:     General:        Right eye: No discharge.        Left eye: No discharge.     Pupils: Pupils are equal, round, and reactive to  light.  Neck:     Thyroid: No thyromegaly.  Cardiovascular:     Rate and Rhythm: Normal rate and regular rhythm.     Heart sounds: Normal heart sounds. No murmur heard.   Pulmonary:     Effort: Pulmonary effort is normal. No respiratory distress.     Breath sounds: Normal breath sounds. No wheezing.  Abdominal:     General: Bowel sounds are normal. There is no distension.     Palpations: Abdomen is soft.     Tenderness: There is no abdominal tenderness.  Musculoskeletal:        General: No tenderness. Normal range of motion.     Cervical back: Normal range of motion and neck supple.  Skin:    General: Skin is warm and dry.     Findings: No erythema or rash.  Neurological:     Mental Status: He is alert and oriented to person, place, and time.     Cranial Nerves: No cranial nerve deficit.     Deep Tendon Reflexes: Reflexes are normal and symmetric.  Psychiatric:        Behavior: Behavior normal.        Thought Content: Thought content normal.        Judgment: Judgment normal.  BP (!) 140/2   Pulse 75   Temp 98.6 F (37 C) (Temporal)   Ht 6' 1"  (1.854 m)   Wt 193 lb 6.4 oz (87.7 kg)   BMI 25.52 kg/m   Assessment & Plan:  Paul Murillo comes in today with chief complaint of Hypothyroidism (2 mth rck ) and Loss of Consciousness (01/11/21 is the last one that happened. 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 Has cardio appt for stress test and heart monitor on 02/18/21)   Diagnosis and orders addressed:  1. SYNCOPE - Anemia Profile B - CMP14+EGFR - EKG 12-Lead  2. Hypothyroidism, unspecified type - CMP14+EGFR - TSH   Labs pending Health Maintenance reviewed Diet and exercise encouraged  Follow up plan: 6 months and keep follow up with Cardiologists   [name], FNP

## 2021-02-01 NOTE — Patient Instructions (Signed)
Hypothyroidism  Hypothyroidism is when the thyroid gland does not make enough of certain hormones (it is underactive). The thyroid gland is a small gland located in the lower front part of the neck, just in front of the windpipe (trachea). This gland makes hormones that help control how the body uses food for energy (metabolism) as well as how the heart and brain function. These hormones also play a role in keeping your bones strong. When the thyroid is underactive, it produces too little of the hormones thyroxine (T4) and triiodothyronine (T3). What are the causes? This condition may be caused by:  Hashimoto's disease. This is a disease in which the body's disease-fighting system (immune system) attacks the thyroid gland. This is the most common cause.  Viral infections.  Pregnancy.  Certain medicines.  Birth defects.  Past radiation treatments to the head or neck for cancer.  Past treatment with radioactive iodine.  Past exposure to radiation in the environment.  Past surgical removal of part or all of the thyroid.  Problems with a gland in the center of the brain (pituitary gland).  Lack of enough iodine in the diet. What increases the risk? You are more likely to develop this condition if:  You are male.  You have a family history of thyroid conditions.  You use a medicine called lithium.  You take medicines that affect the immune system (immunosuppressants). What are the signs or symptoms? Symptoms of this condition include:  Feeling as though you have no energy (lethargy).  Not being able to tolerate cold.  Weight gain that is not explained by a change in diet or exercise habits.  Lack of appetite.  Dry skin.  Coarse hair.  Menstrual irregularity.  Slowing of thought processes.  Constipation.  Sadness or depression. How is this diagnosed? This condition may be diagnosed based on:  Your symptoms, your medical history, and a physical exam.  Blood  tests. You may also have imaging tests, such as an ultrasound or MRI. How is this treated? This condition is treated with medicine that replaces the thyroid hormones that your body does not make. After you begin treatment, it may take several weeks for symptoms to go away. Follow these instructions at home:  Take over-the-counter and prescription medicines only as told by your health care provider.  If you start taking any new medicines, tell your health care provider.  Keep all follow-up visits as told by your health care provider. This is important. ? As your condition improves, your dosage of thyroid hormone medicine may change. ? You will need to have blood tests regularly so that your health care provider can monitor your condition. Contact a health care provider if:  Your symptoms do not get better with treatment.  You are taking thyroid hormone replacement medicine and you: ? Sweat a lot. ? Have tremors. ? Feel anxious. ? Lose weight rapidly. ? Cannot tolerate heat. ? Have emotional swings. ? Have diarrhea. ? Feel weak. Get help right away if you have:  Chest pain.  An irregular heartbeat.  A rapid heartbeat.  Difficulty breathing. Summary  Hypothyroidism is when the thyroid gland does not make enough of certain hormones (it is underactive).  When the thyroid is underactive, it produces too little of the hormones thyroxine (T4) and triiodothyronine (T3).  The most common cause is Hashimoto's disease, a disease in which the body's disease-fighting system (immune system) attacks the thyroid gland. The condition can also be caused by viral infections, medicine, pregnancy, or   past radiation treatment to the head or neck.  Symptoms may include weight gain, dry skin, constipation, feeling as though you do not have energy, and not being able to tolerate cold.  This condition is treated with medicine to replace the thyroid hormones that your body does not make. This  information is not intended to replace advice given to you by your health care provider. Make sure you discuss any questions you have with your health care provider. Document Revised: 07/10/2020 Document Reviewed: 06/25/2020 Elsevier Patient Education  2021 [employer]  

## 2021-02-02 LAB — ANEMIA PROFILE B
Basophils Absolute: 0.1 10*3/uL (ref 0.0–0.2)
Basos: 1 %
EOS (ABSOLUTE): 0.4 10*3/uL (ref 0.0–0.4)
Eos: 5 %
Ferritin: 538 ng/mL — ABNORMAL HIGH (ref 30–400)
Folate: 12.8 ng/mL (ref >3.0)
Hematocrit: 39.6 % (ref 37.5–51.0)
Hemoglobin: 13.3 g/dL (ref 13.0–17.7)
Immature Grans (Abs): 0 10*3/uL (ref 0.0–0.1)
Immature Granulocytes: 0 %
Iron Saturation: 31 % (ref 15–55)
Iron: 82 ug/dL (ref 38–169)
Lymphocytes Absolute: 2.7 10*3/uL (ref 0.7–3.1)
Lymphs: 32 %
MCH: 30.6 pg (ref 26.6–33.0)
MCHC: 33.6 g/dL (ref 31.5–35.7)
MCV: 91 fL (ref 79–97)
Monocytes Absolute: 0.6 10*3/uL (ref 0.1–0.9)
Monocytes: 7 %
Neutrophils Absolute: 4.6 10*3/uL (ref 1.4–7.0)
Neutrophils: 55 %
Platelets: 270 10*3/uL (ref 150–450)
RBC: 4.34 x10E6/uL (ref 4.14–5.80)
RDW: 13.1 % (ref 11.6–15.4)
Retic Ct Pct: 1.2 % (ref 0.6–2.6)
Total Iron Binding Capacity: 268 ug/dL (ref 250–450)
UIBC: 186 ug/dL (ref 111–343)
Vitamin B-12: 702 pg/mL (ref 232–1245)
WBC: 8.4 10*3/uL (ref 3.4–10.8)

## 2021-02-02 LAB — CMP14+EGFR
ALT: 27 IU/L (ref 0–44)
AST: 24 IU/L (ref 0–40)
Albumin/Globulin Ratio: 1.9 (ref 1.2–2.2)
Albumin: 4.6 g/dL (ref 3.8–4.8)
Alkaline Phosphatase: 81 IU/L (ref 44–121)
BUN/Creatinine Ratio: 10 (ref 10–24)
BUN: 13 mg/dL (ref 8–27)
Bilirubin Total: 0.3 mg/dL (ref 0.0–1.2)
CO2: 21 mmol/L (ref 20–29)
Calcium: 9.8 mg/dL (ref 8.6–10.2)
Chloride: 100 mmol/L (ref 96–106)
Creatinine, Ser: 1.29 mg/dL — ABNORMAL HIGH (ref 0.76–1.27)
Globulin, Total: 2.4 g/dL (ref 1.5–4.5)
Glucose: 104 mg/dL — ABNORMAL HIGH (ref 65–99)
Potassium: 4.6 mmol/L (ref 3.5–5.2)
Sodium: 139 mmol/L (ref 134–144)
Total Protein: 7 g/dL (ref 6.0–8.5)
eGFR: 61 mL/min/{1.73_m2} (ref >59)

## 2021-02-02 LAB — TSH: TSH: 2.22 u[IU]/mL (ref 0.450–4.500)

## 2021-02-16 DIAGNOSIS — R0600 Dyspnea, unspecified: Secondary | ICD-10-CM | POA: Diagnosis not present

## 2021-02-16 DIAGNOSIS — R55 Syncope and collapse: Secondary | ICD-10-CM | POA: Diagnosis not present

## 2021-03-05 DIAGNOSIS — R55 Syncope and collapse: Secondary | ICD-10-CM | POA: Diagnosis not present

## 2021-03-05 DIAGNOSIS — R0600 Dyspnea, unspecified: Secondary | ICD-10-CM | POA: Diagnosis not present

## 2021-03-12 DIAGNOSIS — I493 Ventricular premature depolarization: Secondary | ICD-10-CM | POA: Diagnosis not present

## 2021-04-08 ENCOUNTER — Other Ambulatory Visit: Payer: Self-pay | Admitting: Family

## 2021-04-08 DIAGNOSIS — K219 Gastro-esophageal reflux disease without esophagitis: Secondary | ICD-10-CM

## 2021-04-27 ENCOUNTER — Other Ambulatory Visit: Payer: Self-pay | Admitting: Family

## 2021-04-27 DIAGNOSIS — I10 Essential (primary) hypertension: Secondary | ICD-10-CM

## 2021-05-19 ENCOUNTER — Other Ambulatory Visit: Payer: Self-pay

## 2021-05-19 ENCOUNTER — Encounter: Payer: Self-pay | Admitting: Family

## 2021-05-19 ENCOUNTER — Ambulatory Visit (INDEPENDENT_AMBULATORY_CARE_PROVIDER_SITE_OTHER): Payer: Medicare Other | Admitting: Family

## 2021-05-19 ENCOUNTER — Other Ambulatory Visit: Payer: Self-pay | Admitting: Family

## 2021-05-19 VITALS — BP 130/82 | HR 95 | Temp 98.2°F | Ht 73.0 in | Wt 198.0 lb

## 2021-05-19 DIAGNOSIS — Z9861 Coronary angioplasty status: Secondary | ICD-10-CM

## 2021-05-19 DIAGNOSIS — E039 Hypothyroidism, unspecified: Secondary | ICD-10-CM | POA: Diagnosis not present

## 2021-05-19 DIAGNOSIS — E559 Vitamin D deficiency, unspecified: Secondary | ICD-10-CM

## 2021-05-19 DIAGNOSIS — N401 Enlarged prostate with lower urinary tract symptoms: Secondary | ICD-10-CM

## 2021-05-19 DIAGNOSIS — E782 Mixed hyperlipidemia: Secondary | ICD-10-CM

## 2021-05-19 DIAGNOSIS — I2510 Atherosclerotic heart disease of native coronary artery without angina pectoris: Secondary | ICD-10-CM | POA: Diagnosis not present

## 2021-05-19 DIAGNOSIS — I10 Essential (primary) hypertension: Secondary | ICD-10-CM

## 2021-05-19 DIAGNOSIS — K219 Gastro-esophageal reflux disease without esophagitis: Secondary | ICD-10-CM

## 2021-05-19 DIAGNOSIS — Z8546 Personal history of malignant neoplasm of prostate: Secondary | ICD-10-CM

## 2021-05-19 DIAGNOSIS — R351 Nocturia: Secondary | ICD-10-CM

## 2021-05-19 MED ORDER — OMEPRAZOLE 20 MG PO CPDR: DELAYED_RELEASE_CAPSULE | ORAL | 3 refills | Status: DC

## 2021-05-19 MED ORDER — VITAMIN D (ERGOCALCIFEROL) 1.25 MG (50000 UNIT) PO CAPS: 50000.0000 [IU] | ORAL_CAPSULE | ORAL | 1 refills | Status: DC

## 2021-05-19 NOTE — Progress Notes (Signed)
Subjective:    Patient ID: Paul Murillo, male    DOB: [birth year], 65 y.o.   MRN: [id number]  Chief Complaint  Patient presents with   Medical Management of Chronic Issues   Pt presents to the office today for chronic follow up . Pt is followed by Urologists every 6 months for Prostate Cancer. PT states he is followed by Cardiologists annually for CAD. He states he is doing well and retired 05/08/20.  Hypertension This is a chronic problem. The current episode started more than 1 year ago. The problem has been resolved since onset. The problem is controlled. Associated symptoms include malaise/fatigue. Pertinent negatives include no palpitations, peripheral edema or shortness of breath. Risk factors for coronary artery disease include dyslipidemia, male gender and sedentary lifestyle. The current treatment provides moderate improvement. Identifiable causes of hypertension include a thyroid problem.  Gastroesophageal Reflux He complains of belching, heartburn and a hoarse voice. This is a chronic problem. The current episode started more than 1 year ago. The problem occurs occasionally. Associated symptoms include fatigue. Risk factors include obesity. He has tried a PPI for the symptoms. The treatment provided moderate relief.  Thyroid Problem Presents for follow-up visit. Symptoms include fatigue and hoarse voice. Patient reports no depressed mood or palpitations. The symptoms have been stable. His past medical history is significant for hyperlipidemia.  Benign Prostatic Hypertrophy This is a chronic problem. Irritative symptoms include nocturia (3).  Hyperlipidemia This is a chronic problem. The current episode started more than 1 year ago. The problem is controlled. Recent lipid tests were reviewed and are normal. Exacerbating diseases include obesity. Pertinent negatives include no shortness of breath. Current antihyperlipidemic treatment includes statins. The current treatment provides  moderate improvement of lipids. Risk factors for coronary artery disease include dyslipidemia, male sex, hypertension and a sedentary lifestyle.     Review of Systems  Constitutional:  Positive for fatigue and malaise/fatigue.  HENT:  Positive for hoarse voice.   Respiratory:  Negative for shortness of breath.   Cardiovascular:  Negative for palpitations.  Gastrointestinal:  Positive for heartburn.  Genitourinary:  Positive for nocturia (3).  All other systems reviewed and are negative.     Objective:   Physical Exam Vitals reviewed.  Constitutional:      General: He is not in acute distress.    Appearance: He is well-developed.  HENT:     Head: Normocephalic.     Right Ear: Tympanic membrane normal.     Left Ear: Tympanic membrane normal.  Eyes:     General:        Right eye: No discharge.        Left eye: No discharge.     Pupils: Pupils are equal, round, and reactive to light.  Neck:     Thyroid: No thyromegaly.  Cardiovascular:     Rate and Rhythm: Normal rate and regular rhythm.     Heart sounds: Normal heart sounds. No murmur heard. Pulmonary:     Effort: Pulmonary effort is normal. No respiratory distress.     Breath sounds: Normal breath sounds. No wheezing.  Abdominal:     General: Bowel sounds are normal. There is no distension.     Palpations: Abdomen is soft.     Tenderness: There is no abdominal tenderness.  Musculoskeletal:        General: No tenderness. Normal range of motion.     Cervical back: Normal range of motion and neck supple.  Skin:    General: Skin  is warm and dry.     Findings: No erythema or rash.  Neurological:     Mental Status: He is alert and oriented to person, place, and time.     Cranial Nerves: No cranial nerve deficit.     Deep Tendon Reflexes: Reflexes are normal and symmetric.  Psychiatric:        Behavior: Behavior normal.        Thought Content: Thought content normal.        Judgment: Judgment normal.      BP 130/82    Pulse 95   Temp 98.2 F (36.8 C) (Temporal)   Ht 6' 1"  (1.854 m)   Wt 198 lb (89.8 kg)   SpO2 98%   BMI 26.12 kg/m      Assessment & Plan:  Paul Murillo comes in today with chief complaint of Medical Management of Chronic Issues   Diagnosis and orders addressed:  1. Vitamin D deficiency - Vitamin D, Ergocalciferol, (DRISDOL) 1.25 MG (50000 UNIT) CAPS capsule; Take 1 capsule (50,000 Units total) by mouth every 7 (seven) days.  Dispense: 12 capsule; Refill: 1 - CMP14+EGFR  2. Gastroesophageal reflux disease, unspecified whether esophagitis present - omeprazole (PRILOSEC) 20 MG capsule; Once a day  Dispense: 90 capsule; Refill: 3 - CMP14+EGFR   4. Primary hypertension - CMP14+EGFR  5. CAD S/P percutaneous coronary angioplast - CMP14+EGFR  6. Hypothyroidism, unspecified type - CMP14+EGFR  7. Benign prostatic hyperplasia with nocturia - CMP14+EGFR  8. Mixed hyperlipidemia - CMP14+EGFR  9. H/O malignant neoplasm of prostate - CMP14+EGFR   Labs pending Health Maintenance reviewed Diet and exercise encouraged  Follow up plan: 6 months    [name], FNP

## 2021-05-19 NOTE — Patient Instructions (Signed)

## 2021-05-20 LAB — CMP14+EGFR
ALT: 22 IU/L (ref 0–44)
AST: 18 IU/L (ref 0–40)
Albumin/Globulin Ratio: 1.8 (ref 1.2–2.2)
Albumin: 4.5 g/dL (ref 3.8–4.8)
Alkaline Phosphatase: 84 IU/L (ref 44–121)
BUN/Creatinine Ratio: 8 — ABNORMAL LOW (ref 10–24)
BUN: 10 mg/dL (ref 8–27)
Bilirubin Total: 0.5 mg/dL (ref 0.0–1.2)
CO2: 22 mmol/L (ref 20–29)
Calcium: 9.8 mg/dL (ref 8.6–10.2)
Chloride: 103 mmol/L (ref 96–106)
Creatinine, Ser: 1.25 mg/dL (ref 0.76–1.27)
Globulin, Total: 2.5 g/dL (ref 1.5–4.5)
Glucose: 125 mg/dL — ABNORMAL HIGH (ref 65–99)
Potassium: 4 mmol/L (ref 3.5–5.2)
Sodium: 138 mmol/L (ref 134–144)
Total Protein: 7 g/dL (ref 6.0–8.5)
eGFR: 63 mL/min/{1.73_m2} (ref >59)

## 2021-06-09 ENCOUNTER — Other Ambulatory Visit: Payer: Self-pay | Admitting: Family

## 2021-06-09 DIAGNOSIS — I10 Essential (primary) hypertension: Secondary | ICD-10-CM

## 2021-09-21 ENCOUNTER — Ambulatory Visit (INDEPENDENT_AMBULATORY_CARE_PROVIDER_SITE_OTHER): Payer: Medicare Other | Admitting: Family

## 2021-09-21 ENCOUNTER — Encounter: Payer: Self-pay | Admitting: Family

## 2021-09-21 VITALS — BP 125/78 | HR 76 | Temp 98.2°F | Ht 73.0 in | Wt 196.6 lb

## 2021-09-21 DIAGNOSIS — I10 Essential (primary) hypertension: Secondary | ICD-10-CM | POA: Diagnosis not present

## 2021-09-21 DIAGNOSIS — R351 Nocturia: Secondary | ICD-10-CM

## 2021-09-21 DIAGNOSIS — E039 Hypothyroidism, unspecified: Secondary | ICD-10-CM | POA: Diagnosis not present

## 2021-09-21 DIAGNOSIS — K219 Gastro-esophageal reflux disease without esophagitis: Secondary | ICD-10-CM | POA: Diagnosis not present

## 2021-09-21 DIAGNOSIS — E559 Vitamin D deficiency, unspecified: Secondary | ICD-10-CM

## 2021-09-21 DIAGNOSIS — Z8546 Personal history of malignant neoplasm of prostate: Secondary | ICD-10-CM

## 2021-09-21 DIAGNOSIS — E782 Mixed hyperlipidemia: Secondary | ICD-10-CM

## 2021-09-21 DIAGNOSIS — N401 Enlarged prostate with lower urinary tract symptoms: Secondary | ICD-10-CM

## 2021-09-21 NOTE — Progress Notes (Signed)
Subjective:    Patient ID: Paul Murillo, male    DOB: Dec 17, 1955, 65 y.o.   MRN: [id number]  Chief Complaint  Patient presents with   Medical Management of Chronic Issues   Pt presents to the office today for chronic follow up . Pt is followed by Urologists every 6 months for Prostate Cancer. PT states he is followed by Cardiologists annually for CAD. He states he is doing well and retired 05/08/20. Hypertension This is a chronic problem. The current episode started more than 1 year ago. The problem has been resolved since onset. The problem is controlled. Associated symptoms include malaise/fatigue. Pertinent negatives include no peripheral edema or shortness of breath. Risk factors for coronary artery disease include dyslipidemia, obesity and male gender. The current treatment provides moderate improvement. Identifiable causes of hypertension include a thyroid problem.  Gastroesophageal Reflux He complains of belching and heartburn. This is a chronic problem. The current episode started more than 1 year ago. The problem occurs occasionally. The problem has been waxing and waning. Associated symptoms include fatigue. He has tried a PPI for the symptoms. The treatment provided moderate relief.  Thyroid Problem Presents for follow-up visit. Symptoms include fatigue. Patient reports no anxiety, constipation or depressed mood. The symptoms have been stable. His past medical history is significant for hyperlipidemia.  Hyperlipidemia This is a chronic problem. The current episode started more than 1 year ago. The problem is controlled. Associated symptoms include leg pain. Pertinent negatives include no shortness of breath. Current antihyperlipidemic treatment includes statins. The current treatment provides moderate improvement of lipids. Risk factors for coronary artery disease include dyslipidemia, hypertension, male sex and a sedentary lifestyle.     Review of Systems  Constitutional:  Positive  for fatigue and malaise/fatigue.  Respiratory:  Negative for shortness of breath.   Gastrointestinal:  Positive for heartburn. Negative for constipation.  Psychiatric/Behavioral:  The patient is not nervous/anxious.   All other systems reviewed and are negative.     Objective:   Physical Exam Vitals reviewed.  Constitutional:      General: He is not in acute distress.    Appearance: He is well-developed.  HENT:     Head: Normocephalic.     Right Ear: Tympanic membrane normal.     Left Ear: Tympanic membrane normal.  Eyes:     General:        Right eye: No discharge.        Left eye: No discharge.     Pupils: Pupils are equal, round, and reactive to light.  Neck:     Thyroid: No thyromegaly.  Cardiovascular:     Rate and Rhythm: Normal rate and regular rhythm.     Heart sounds: Normal heart sounds. No murmur heard. Pulmonary:     Effort: Pulmonary effort is normal. No respiratory distress.     Breath sounds: Normal breath sounds. No wheezing.  Abdominal:     General: Bowel sounds are normal. There is no distension.     Palpations: Abdomen is soft.     Tenderness: There is no abdominal tenderness.  Musculoskeletal:        General: No tenderness. Normal range of motion.     Cervical back: Normal range of motion and neck supple.  Skin:    General: Skin is warm and dry.     Findings: No erythema or rash.  Neurological:     Mental Status: He is alert and oriented to person, place, and time.  Cranial Nerves: No cranial nerve deficit.     Deep Tendon Reflexes: Reflexes are normal and symmetric.  Psychiatric:        Behavior: Behavior normal.        Thought Content: Thought content normal.        Judgment: Judgment normal.      BP 125/78   Pulse 76   Temp 98.2 F (36.8 C) (Temporal)   Ht 6' 1"  (1.854 m)   Wt 196 lb 9.6 oz (89.2 kg)   BMI 25.94 kg/m      Assessment & Plan:  Paul Murillo comes in today with chief complaint of Medical Management of Chronic  Issues   Diagnosis and orders addressed:  1. Primary hypertension - CMP14+EGFR  2. Hypothyroidism, unspecified type - CMP14+EGFR  3. Gastroesophageal reflux disease, unspecified whether esophagitis present - CMP14+EGFR  4. Benign prostatic hyperplasia with nocturia - CMP14+EGFR  5. Mixed hyperlipidemia  - CMP14+EGFR  6. H/O malignant neoplasm of prostate - CMP14+EGFR  7. Vitamin D deficiency  - CMP14+EGFR   Labs pending Health Maintenance reviewed Diet and exercise encouraged  Follow up plan: 6 months    [name], FNP

## 2021-09-21 NOTE — Patient Instructions (Signed)
Health Maintenance After Age 67 After age 67, you are at a higher risk for certain long-term diseases and infections as well as injuries from falls. Falls are a major cause of broken bones and head injuries in people who are older than age 67. Getting regular preventive care can help to keep you healthy and well. Preventive care includes getting regular testing and making lifestyle changes as recommended by your health care provider. Talk with your health care provider about: Which screenings and tests you should have. A screening is a test that checks for a disease when you have no symptoms. A diet and exercise plan that is right for you. What should I know about screenings and tests to prevent falls? Screening and testing are the best ways to find a health problem early. Early diagnosis and treatment give you the best chance of managing medical conditions that are common after age 67. Certain conditions and lifestyle choices may make you more likely to have a fall. Your health care provider may recommend: Regular vision checks. Poor vision and conditions such as cataracts can make you more likely to have a fall. If you wear glasses, make sure to get your prescription updated if your vision changes. Medicine review. Work with your health care provider to regularly review all of the medicines you are taking, including over-the-counter medicines. Ask your health care provider about any side effects that may make you more likely to have a fall. Tell your health care provider if any medicines that you take make you feel dizzy or sleepy. Strength and balance checks. Your health care provider may recommend certain tests to check your strength and balance while standing, walking, or changing positions. Foot health exam. Foot pain and numbness, as well as not wearing proper footwear, can make you more likely to have a fall. Screenings, including: Osteoporosis screening. Osteoporosis is a condition that causes  the bones to get weaker and break more easily. Blood pressure screening. Blood pressure changes and medicines to control blood pressure can make you feel dizzy. Depression screening. You may be more likely to have a fall if you have a fear of falling, feel depressed, or feel unable to do activities that you used to do. Alcohol use screening. Using too much alcohol can affect your balance and may make you more likely to have a fall. Follow these instructions at home: Lifestyle Do not drink alcohol if: Your health care provider tells you not to drink. If you drink alcohol: Limit how much you have to: 0-1 drink a day for women. 0-2 drinks a day for men. Know how much alcohol is in your drink. In the [place] one drink equals one 12 oz bottle of beer (355 mL), one 5 oz glass of wine (148 mL), or one 1 oz glass of hard liquor (44 mL). Do not use any products that contain nicotine or tobacco. These products include cigarettes, chewing tobacco, and vaping devices, such as e-cigarettes. If you need help quitting, ask your health care provider. Activity  Follow a regular exercise program to stay fit. This will help you maintain your balance. Ask your health care provider what types of exercise are appropriate for you. If you need a cane or walker, use it as recommended by your health care provider. Wear supportive shoes that have nonskid soles. Safety  Remove any tripping hazards, such as rugs, cords, and clutter. Install safety equipment such as grab bars in bathrooms and safety rails on stairs. Keep rooms and walkways   well-lit. General instructions Talk with your health care provider about your risks for falling. Tell your health care provider if: You fall. Be sure to tell your health care provider about all falls, even ones that seem minor. You feel dizzy, tiredness (fatigue), or off-balance. Take over-the-counter and prescription medicines only as told by your health care provider. These include  supplements. Eat a healthy diet and maintain a healthy weight. A healthy diet includes low-fat dairy products, low-fat (lean) meats, and fiber from whole grains, beans, and lots of fruits and vegetables. Stay current with your vaccines. Schedule regular health, dental, and eye exams. Summary Having a healthy lifestyle and getting preventive care can help to protect your health and wellness after age 67. Screening and testing are the best way to find a health problem early and help you avoid having a fall. Early diagnosis and treatment give you the best chance for managing medical conditions that are more common for people who are older than age 67. Falls are a major cause of broken bones and head injuries in people who are older than age 67. Take precautions to prevent a fall at home. Work with your health care provider to learn what changes you can make to improve your health and wellness and to prevent falls. This information is not intended to replace advice given to you by your health care provider. Make sure you discuss any questions you have with your health care provider. Document Revised: 03/01/2021 Document Reviewed: 03/01/2021 Elsevier Patient Education  2022 [employer]  

## 2021-09-22 LAB — CMP14+EGFR
ALT: 24 IU/L (ref 0–44)
AST: 19 IU/L (ref 0–40)
Albumin/Globulin Ratio: 1.9 (ref 1.2–2.2)
Albumin: 4.6 g/dL (ref 3.8–4.8)
Alkaline Phosphatase: 83 IU/L (ref 44–121)
BUN/Creatinine Ratio: 7 — ABNORMAL LOW (ref 10–24)
BUN: 9 mg/dL (ref 8–27)
Bilirubin Total: 0.5 mg/dL (ref 0.0–1.2)
CO2: 23 mmol/L (ref 20–29)
Calcium: 9.5 mg/dL (ref 8.6–10.2)
Chloride: 103 mmol/L (ref 96–106)
Creatinine, Ser: 1.36 mg/dL — ABNORMAL HIGH (ref 0.76–1.27)
Globulin, Total: 2.4 g/dL (ref 1.5–4.5)
Glucose: 90 mg/dL (ref 70–99)
Potassium: 4.5 mmol/L (ref 3.5–5.2)
Sodium: 139 mmol/L (ref 134–144)
Total Protein: 7 g/dL (ref 6.0–8.5)
eGFR: 57 mL/min/{1.73_m2} — ABNORMAL LOW (ref >59)

## 2021-09-24 MED ORDER — AMOXICILLIN-POT CLAVULANATE 875-125 MG PO TABS: 1.0000 | ORAL_TABLET | Freq: Two times a day (BID) | ORAL | 0 refills | Status: DC

## 2021-09-24 NOTE — Addendum Note (Signed)
Addended by: [name] A on: 09/24/2021 01:07 PM   Modules accepted: Orders

## 2021-11-12 ENCOUNTER — Other Ambulatory Visit: Payer: Self-pay | Admitting: Family

## 2021-11-12 DIAGNOSIS — E559 Vitamin D deficiency, unspecified: Secondary | ICD-10-CM

## 2021-11-25 ENCOUNTER — Ambulatory Visit (INDEPENDENT_AMBULATORY_CARE_PROVIDER_SITE_OTHER): Payer: Medicare Other

## 2021-11-25 VITALS — Wt 196.0 lb

## 2021-11-25 DIAGNOSIS — Z0000 Encounter for general adult medical examination without abnormal findings: Secondary | ICD-10-CM | POA: Diagnosis not present

## 2021-11-25 NOTE — Patient Instructions (Addendum)
Paul Murillo , Thank you for taking time to come for your Medicare Wellness Visit. I appreciate your ongoing commitment to your health goals. Please review the following plan we discussed and let me know if I can assist you in the future.   Screening recommendations/referrals: Colonoscopy: Cologuard done 12/25/2020 - repeat in 3 years Recommended yearly ophthalmology/optometry visit for glaucoma screening and checkup Recommended yearly dental visit for hygiene and checkup  Vaccinations: Influenza vaccine: Done 11/22/2021 - repeat in fall Pneumococcal vaccine: Done 05/16/2019 & 11/19/2020 Tdap vaccine: Done 01/04/2010 - Repeat in 10 years *Due now Shingles vaccine: Zostavax done 02/22/2016 - Shingrix due now - 2 doses 2 months apart recommended   Covid-19: Done 01/04/2020, 01/25/2020, 08/08/2020, & 11/22/2021  Advanced directives: Advance directive discussed with you today. Even though you declined this today, please call our office should you change your mind, and we can give you the proper paperwork for you to fill out.   Conditions/risks identified: Aim for 30 minutes of exercise or brisk walking each day, drink 6-8 glasses of water and eat lots of fruits and vegetables.   If you wish to quit smoking, help is available. For free tobacco cessation program offerings call the [facility] at (332)117-8738 or Live Well Line at 364-102-4965. You may also visit www.[place].com or email livelifewell@[address] .com for more information on other programs.   You may also call 1-800-QUIT-NOW 706-474-4377) or visit www.[facility].dk or www.[facility].org for additional resources on smoking cessation.    Next appointment: Follow up in one year for your annual wellness visit.   Preventive Care 68 Years and Older, Male  Preventive care refers to lifestyle choices and visits with your health care provider that can promote health and wellness. What does preventive care include? A yearly physical  exam. This is also called an annual well check. Dental exams once or twice a year. Routine eye exams. Ask your health care provider how often you should have your eyes checked. Personal lifestyle choices, including: Daily care of your teeth and gums. Regular physical activity. Eating a healthy diet. Avoiding tobacco and drug use. Limiting alcohol use. Practicing safe sex. Taking low doses of aspirin every day. Taking vitamin and mineral supplements as recommended by your health care provider. What happens during an annual well check? The services and screenings done by your health care provider during your annual well check will depend on your age, overall health, lifestyle risk factors, and family history of disease. Counseling  Your health care provider may ask you questions about your: Alcohol use. Tobacco use. Drug use. Emotional well-being. Home and relationship well-being. Sexual activity. Eating habits. History of falls. Memory and ability to understand (cognition). Work and work Statistician. Screening  You may have the following tests or measurements: Height, weight, and BMI. Blood pressure. Lipid and cholesterol levels. These may be checked every 5 years, or more frequently if you are over 43 years old. Skin check. Lung cancer screening. You may have this screening every year starting at age 98 if you have a 30-pack-year history of smoking and currently smoke or have quit within the past 15 years. Fecal occult blood test (FOBT) of the stool. You may have this test every year starting at age 63. Flexible sigmoidoscopy or colonoscopy. You may have a sigmoidoscopy every 5 years or a colonoscopy every 10 years starting at age 28. Prostate cancer screening. Recommendations will vary depending on your family history and other risks. Hepatitis C blood test. Hepatitis B blood  test. Sexually transmitted disease (STD) testing. Diabetes screening. This is done by checking your  blood sugar (glucose) after you have not eaten for a while (fasting). You may have this done every 1-3 years. Abdominal aortic aneurysm (AAA) screening. You may need this if you are a current or former smoker. Osteoporosis. You may be screened starting at age 39 if you are at high risk. Talk with your health care provider about your test results, treatment options, and if necessary, the need for more tests. Vaccines  Your health care provider may recommend certain vaccines, such as: Influenza vaccine. This is recommended every year. Tetanus, diphtheria, and acellular pertussis (Tdap, Td) vaccine. You may need a Td booster every 10 years. Zoster vaccine. You may need this after age 36. Pneumococcal 13-valent conjugate (PCV13) vaccine. One dose is recommended after age 9. Pneumococcal polysaccharide (PPSV23) vaccine. One dose is recommended after age 77. Talk to your health care provider about which screenings and vaccines you need and how often you need them. This information is not intended to replace advice given to you by your health care provider. Make sure you discuss any questions you have with your health care provider. Document Released: 11/06/2015 Document Revised: 06/29/2016 Document Reviewed: 08/11/2015 Elsevier Interactive Patient Education  2017 [place] Prevention in the Home Falls can cause injuries. They can happen to people of all ages. There are many things you can do to make your home safe and to help prevent falls. What can I do on the outside of my home? Regularly fix the edges of walkways and driveways and fix any cracks. Remove anything that might make you trip as you walk through a door, such as a raised step or threshold. Trim any bushes or trees on the path to your home. Use bright outdoor lighting. Clear any walking paths of anything that might make someone trip, such as rocks or tools. Regularly check to see if handrails are loose or broken. Make sure  that both sides of any steps have handrails. Any raised decks and porches should have guardrails on the edges. Have any leaves, snow, or ice cleared regularly. Use sand or salt on walking paths during winter. Clean up any spills in your garage right away. This includes oil or grease spills. What can I do in the bathroom? Use night lights. Install grab bars by the toilet and in the tub and shower. Do not use towel bars as grab bars. Use non-skid mats or decals in the tub or shower. If you need to sit down in the shower, use a plastic, non-slip stool. Keep the floor dry. Clean up any water that spills on the floor as soon as it happens. Remove soap buildup in the tub or shower regularly. Attach bath mats securely with double-sided non-slip rug tape. Do not have throw rugs and other things on the floor that can make you trip. What can I do in the bedroom? Use night lights. Make sure that you have a light by your bed that is easy to reach. Do not use any sheets or blankets that are too big for your bed. They should not hang down onto the floor. Have a firm chair that has side arms. You can use this for support while you get dressed. Do not have throw rugs and other things on the floor that can make you trip. What can I do in the kitchen? Clean up any spills right away. Avoid walking on wet floors. Keep items that  you use a lot in easy-to-reach places. If you need to reach something above you, use a strong step stool that has a grab bar. Keep electrical cords out of the way. Do not use floor polish or wax that makes floors slippery. If you must use wax, use non-skid floor wax. Do not have throw rugs and other things on the floor that can make you trip. What can I do with my stairs? Do not leave any items on the stairs. Make sure that there are handrails on both sides of the stairs and use them. Fix handrails that are broken or loose. Make sure that handrails are as long as the  stairways. Check any carpeting to make sure that it is firmly attached to the stairs. Fix any carpet that is loose or worn. Avoid having throw rugs at the top or bottom of the stairs. If you do have throw rugs, attach them to the floor with carpet tape. Make sure that you have a light switch at the top of the stairs and the bottom of the stairs. If you do not have them, ask someone to add them for you. What else can I do to help prevent falls? Wear shoes that: Do not have high heels. Have rubber bottoms. Are comfortable and fit you well. Are closed at the toe. Do not wear sandals. If you use a stepladder: Make sure that it is fully opened. Do not climb a closed stepladder. Make sure that both sides of the stepladder are locked into place. Ask someone to hold it for you, if possible. Clearly mark and make sure that you can see: Any grab bars or handrails. First and last steps. Where the edge of each step is. Use tools that help you move around (mobility aids) if they are needed. These include: Canes. Walkers. Scooters. Crutches. Turn on the lights when you go into a dark area. Replace any light bulbs as soon as they burn out. Set up your furniture so you have a clear path. Avoid moving your furniture around. If any of your floors are uneven, fix them. If there are any pets around you, be aware of where they are. Review your medicines with your doctor. Some medicines can make you feel dizzy. This can increase your chance of falling. Ask your doctor what other things that you can do to help prevent falls. This information is not intended to replace advice given to you by your health care provider. Make sure you discuss any questions you have with your health care provider. Document Released: 08/06/2009 Document Revised: 03/17/2016 Document Reviewed: 11/14/2014 Elsevier Interactive Patient Education  2017 [employer].

## 2021-11-25 NOTE — Progress Notes (Signed)
Subjective:   Paul Murillo is a 66 y.o. male who presents for Medicare Annual/Subsequent preventive examination.  Virtual Visit via Telephone Note  I connected with  Paul Murillo on 11/25/21 at  2:45 PM EST by telephone and verified that I am speaking with the correct person using two identifiers.  Location: Patient: Home Provider: [facility] Persons participating in the virtual visit: patient/Nurse Health Advisor   I discussed the limitations, risks, security and privacy concerns of performing an evaluation and management service by telephone and the availability of in person appointments. The patient expressed understanding and agreed to proceed.  Interactive audio and video telecommunications were attempted between this nurse and patient, however failed, due to patient having technical difficulties OR patient did not have access to video capability.  We continued and completed visit with audio only.  Some vital signs may be absent or patient reported.   Maddisen Vought E Paelyn Smick, LPN   Review of Systems     Cardiac Risk Factors include: advanced age (>33men, >54 women);sedentary lifestyle;hypertension;dyslipidemia;male gender;smoking/ tobacco exposure;Other (see comment), Risk factor comments: CAD, hx of MI     Objective:    Today's Vitals   11/25/21 1421  Weight: 196 lb (88.9 kg)   Body mass index is 25.86 kg/m.  Advanced Directives 11/25/2021 11/24/2020 03/20/2018 11/18/2016 07/06/2016 01/18/2016 01/29/2014  Does Patient Have a Medical Advance Directive? No Yes No No No No Patient does not have advance directive  Type of Advance Directive - [place]  Does patient want to make changes to medical advance directive? - No - Patient declined - - - - -  Copy of [place] in Chart? - No - copy requested - - - - -  Would patient like information on creating a medical advance directive? No - Patient declined - - No - Patient declined No - patient declined  information No - patient declined information -    Current Medications (verified) Outpatient Encounter Medications as of 11/25/2021  Medication Sig   alfuzosin (UROXATRAL) 10 MG 24 hr tablet    amLODipine (NORVASC) 5 MG tablet TAKE 1 TABLET BY MOUTH EVERY DAY   aspirin 81 MG EC tablet Take 81 mg by mouth daily.   atorvastatin (LIPITOR) 80 MG tablet TAKE 1 TABLET BY MOUTH EVERY DAY   levothyroxine (SYNTHROID) 75 MCG tablet TAKE 1 TABLET BY MOUTH EVERY DAY   omeprazole (PRILOSEC) 20 MG capsule Once a day   oxybutynin (DITROPAN-XL) 10 MG 24 hr tablet    tamsulosin (FLOMAX) 0.4 MG CAPS capsule TAKE 1 CAPSULE BY MOUTH EVERY DAY   telmisartan (MICARDIS) 80 MG tablet TAKE 1 TABLET BY MOUTH EVERY DAY   Vitamin D, Ergocalciferol, (DRISDOL) 1.25 MG (50000 UNIT) CAPS capsule TAKE 1 CAPSULE (50,000 UNITS TOTAL) BY MOUTH EVERY 7 (SEVEN) DAYS   nitroGLYCERIN (NITROSTAT) 0.4 MG SL tablet Place 1 tablet (0.4 mg total) under the tongue every 5 (five) minutes as needed for chest pain. Reported on 04/01/2016 (Patient not taking: Reported on 11/25/2021)   vardenafil (LEVITRA) 20 MG tablet Take 1 tablet (20 mg total) by mouth daily as needed for erectile dysfunction. (Patient not taking: Reported on 11/25/2021)   [DISCONTINUED] amoxicillin-clavulanate (AUGMENTIN) 875-125 MG tablet Take 1 tablet by mouth 2 (two) times daily. (Patient not taking: Reported on 11/25/2021)   No facility-administered encounter medications on file as of 11/25/2021.    Allergies (verified) Patient has no known allergies.   History: Past Medical History:  Diagnosis Date   Cancer [facility])    Prostate   Coronary artery disease    ED (erectile dysfunction)    Genitourinary disorder    Headache    Hypercholesteremia    Hypertension    MI (myocardial infarction) ([place])    Reflux    Syncope    Vitamin D deficiency    Past Surgical History:  Procedure Laterality Date   APPENDECTOMY     HERNIA REPAIR     LEFT HEART CATHETERIZATION WITH  CORONARY ANGIOGRAM N/A 01/29/2014   Procedure: LEFT HEART CATHETERIZATION WITH CORONARY ANGIOGRAM;  Surgeon: Peter M Martinique, MD;  Location: [facility] CATH LAB;  Service: Cardiovascular;  Laterality: N/A;   TOTAL HIP ARTHROPLASTY     Family History  Problem Relation Age of Onset   Heart disease Father    Hypertension Father    Diabetes Brother    Cancer Neg Hx    Social History   Socioeconomic History   Marital status: Married    Spouse name: Thayer Headings   Number of children: 2   Years of education: Not on file   Highest education level: 12th grade  Occupational History   Occupation: Retired  Tobacco Use   Smoking status: Every Day    Packs/day: 0.25    Years: 50.00    Pack years: 12.50    Types: Cigarettes   Smokeless tobacco: Never   Tobacco comments:    Quit 2021 - started back  Vaping Use   Vaping Use: Never used  Substance and Sexual Activity   Alcohol use: Yes    Alcohol/week: 0.0 standard drinks    Comment: occ.   Drug use: No   Sexual activity: Yes  Other Topics Concern   Not on file  Social History Narrative   Children live in [place]   Social Determinants of Health   Financial Resource Strain: Low Risk    Difficulty of Paying Living Expenses: Not hard at all  Food Insecurity: No Food Insecurity   Worried About Charity fundraiser in the Last Year: Never true   Arboriculturist in the Last Year: Never true  Transportation Needs: No Transportation Needs   Lack of Transportation (Medical): No   Lack of Transportation (Non-Medical): No  Physical Activity: Inactive   Days of Exercise per Week: 0 days   Minutes of Exercise per Session: 0 min  Stress: No Stress Concern Present   Feeling of Stress : Not at all  Social Connections: Moderately Integrated   Frequency of Communication with Friends and Family: More than three times a week   Frequency of Social Gatherings with Friends and Family: More than three times a week   Attends Religious Services: More than 4 times per year    Active Member of [employer] or Organizations: No   Attends Archivist Meetings: Never   Marital Status: Married    Tobacco Counseling Ready to quit: Not Answered Counseling given: Not Answered Tobacco comments: Quit 2021 - started back   Clinical Intake:  Pre-visit preparation completed: Yes  Pain : No/denies pain     BMI - recorded: 25.86 Nutritional Status: BMI 25 -29 Overweight Nutritional Risks: None Diabetes: No  How often do you need to have someone help you when you read instructions, pamphlets, or other written materials from your doctor or pharmacy?: 1 - Never  Diabetic? no  Interpreter Needed?: No  Information entered by :: Trenell Concannon, LPN   Activities of Daily Living In your present  state of health, do you have any difficulty performing the following activities: 11/25/2021  Hearing? N  Vision? N  Difficulty concentrating or making decisions? N  Walking or climbing stairs? N  Dressing or bathing? N  Doing errands, shopping? N  Preparing Food and eating ? N  Using the Toilet? N  In the past six months, have you accidently leaked urine? Y  Comment seeing Urology - improved  Do you have problems with loss of bowel control? N  Managing your Medications? N  Managing your Finances? N  Housekeeping or managing your Housekeeping? N  Some recent data might be hidden    Patient Care Team: Sharion Balloon, FNP as PCP - General (Family Medicine) Ceasar Mons, MD as Consulting Physician (Urology) Ebbie Ridge, MD as Referring Physician (Cardiology)  Indicate any recent Medical Services you may have received from other than [facility] providers in the past year (date may be approximate).     Assessment:   This is a routine wellness examination for Paul Murillo.  Hearing/Vision screen Hearing Screening - Comments:: Denies hearing difficulties   Vision Screening - Comments:: Wears rx glasses - up to date with routine eye exams with  optometrist in [place]  Dietary issues and exercise activities discussed: Current Exercise Habits: The patient does not participate in regular exercise at present, Exercise limited by: respiratory conditions(s);cardiac condition(s)   Goals Addressed             This Visit's Progress    AWV   On track    11/24/2020 AWV Goal: Exercise for General Health  Patient will verbalize understanding of the benefits of increased physical activity: Exercising regularly is important. It will improve your overall fitness, flexibility, and endurance. Regular exercise also will improve your overall health. It can help you control your weight, reduce stress, and improve your bone density. Over the next year, patient will increase physical activity as tolerated with a goal of at least 150 minutes of moderate physical activity per week.  You can tell that you are exercising at a moderate intensity if your heart starts beating faster and you start breathing faster but can still hold a conversation. Moderate-intensity exercise ideas include: Walking 1 mile (1.6 km) in about 15 minutes Biking Hiking Golfing Dancing Water aerobics Patient will verbalize understanding of everyday activities that increase physical activity by providing examples like the following: Yard work, such as: Sales promotion account executive Gardening Washing windows or floors Patient will be able to explain general safety guidelines for exercising:  Before you start a new exercise program, talk with your health care provider. Do not exercise so much that you hurt yourself, feel dizzy, or get very short of breath. Wear comfortable clothes and wear shoes with good support. Drink plenty of water while you exercise to prevent dehydration or heat stroke. Work out until your breathing and your heartbeat get faster.        Depression Screen PHQ 2/9 Scores  11/25/2021 02/01/2021 11/24/2020 11/19/2020 05/19/2020 11/18/2019 05/16/2019  PHQ - 2 Score 0 0 0 0 0 0 0  PHQ- 9 Score - - - - - - -    Fall Risk Fall Risk  11/25/2021 02/01/2021 11/24/2020 11/19/2020 05/19/2020  Falls in the past year? 0 1 0 0 0  Number falls in past yr: 0 1 - - -  Comment - - - - -  Injury with Fall? 0 0 - - -  Risk for fall due to : No Fall Risks History of fall(s) - - -  Follow up Falls prevention discussed Education provided - - -    FALL RISK PREVENTION PERTAINING TO THE HOME:  Any stairs in or around the home? Yes  If so, are there any without handrails? Yes  but there is a wall on both side to hold Home free of loose throw rugs in walkways, pet beds, electrical cords, etc? Yes  Adequate lighting in your home to reduce risk of falls? Yes   ASSISTIVE DEVICES UTILIZED TO PREVENT FALLS:  Life alert? No  Use of a cane, walker or w/c? No  Grab bars in the bathroom? No  Shower chair or bench in shower? No  Elevated toilet seat or a handicapped toilet? No   TIMED UP AND GO:  Was the test performed? No . Telephonic visit  Cognitive Function: Normal cognitive status assessed by direct observation by this Nurse Health Advisor. No abnormalities found.       6CIT Screen 11/25/2021  What Year? 0 points  What month? 0 points  What time? 0 points  Count back from 20 0 points  Months in reverse 0 points  Repeat phrase 0 points  Total Score 0    Immunizations Immunization History  Administered Date(s) Administered   DTaP 01/04/2010   Influenza Whole 08/12/2008   Influenza, High Dose Seasonal PF 08/01/2019, 11/22/2021   Influenza,inj,Quad PF,6+ Mos 08/05/2014, 07/30/2015, 08/25/2016, 08/16/2017, 08/10/2018   Influenza-Unspecified 08/08/2018, 08/08/2020   PFIZER(Purple Top)SARS-COV-2 Vaccination 01/04/2020, 01/25/2020, 08/08/2020   Pfizer Covid-19 Vaccine Bivalent Booster 22yrs & up 11/22/2021   Pneumococcal Conjugate-13 11/19/2020   Pneumococcal Polysaccharide-23  05/16/2019   Td 01/04/2010   Tdap 01/04/2010   Zoster, Live 02/22/2016    TDAP status: Due, Education has been provided regarding the importance of this vaccine. Advised may receive this vaccine at local pharmacy or Health Dept. Aware to provide a copy of the vaccination record if obtained from local pharmacy or Health Dept. Verbalized acceptance and understanding.  Flu Vaccine status: Up to date  Pneumococcal vaccine status: Up to date  Covid-19 vaccine status: Completed vaccines  Qualifies for Shingles Vaccine? Yes   Zostavax completed Yes   Shingrix Completed?: No.    Education has been provided regarding the importance of this vaccine. Patient has been advised to call insurance company to determine out of pocket expense if they have not yet received this vaccine. Advised may also receive vaccine at local pharmacy or Health Dept. Verbalized acceptance and understanding.  Screening Tests Health Maintenance  Topic Date Due   Zoster Vaccines- Shingrix (1 of 2) 12/21/2021 (Originally 05/05/1975)   TETANUS/TDAP  09/21/2022 (Originally 01/05/2020)   Fecal DNA (Cologuard)  12/11/2023   Pneumonia Vaccine 19+ Years old  Completed   INFLUENZA VACCINE  Completed   COVID-19 Vaccine  Completed   Hepatitis C Screening  Completed   HPV VACCINES  Aged Out   COLONOSCOPY (Pts 45-14yrs Insurance coverage will need to be confirmed)  Discontinued    Health Maintenance  There are no preventive care reminders to display for this patient.  Colorectal cancer screening: Type of screening: Cologuard. Completed 12/25/2020. Repeat every 3 years  Lung Cancer Screening: (Low Dose CT Chest recommended if Age 22-80 years, 30 pack-year currently smoking OR have quit w/in 15years.) does not qualify.  Additional Screening:  Hepatitis C Screening: does qualify; Completed 08/25/2015  Vision Screening: Recommended annual ophthalmology exams for early detection of glaucoma and other disorders  of the eye. Is the  patient up to date with their annual eye exam?  Yes  Who is the provider or what is the name of the office in which the patient attends annual eye exams? Optometrist in [place]  If pt is not established with a provider, would they like to be referred to a provider to establish care? No .   Dental Screening: Recommended annual dental exams for proper oral hygiene  Community Resource Referral / Chronic Care Management: CRR required this visit?  No   CCM required this visit?  No      Plan:     I have personally reviewed and noted the following in the patients chart:   Medical and social history Use of alcohol, tobacco or illicit drugs  Current medications and supplements including opioid prescriptions. Patient is not currently taking opioid prescriptions. Functional ability and status Nutritional status Physical activity Advanced directives List of other physicians Hospitalizations, surgeries, and ER visits in previous 12 months Vitals Screenings to include cognitive, depression, and falls Referrals and appointments  In addition, I have reviewed and discussed with patient certain preventive protocols, quality metrics, and best practice recommendations. A written personalized care plan for preventive services as well as general preventive health recommendations were provided to patient.     Sandrea Hammond, LPN   04/01/6294   Nurse Notes: None

## 2021-12-05 ENCOUNTER — Other Ambulatory Visit: Payer: Self-pay | Admitting: Family

## 2021-12-05 DIAGNOSIS — I10 Essential (primary) hypertension: Secondary | ICD-10-CM

## 2021-12-30 DIAGNOSIS — M25512 Pain in left shoulder: Secondary | ICD-10-CM | POA: Diagnosis not present

## 2021-12-30 DIAGNOSIS — Z9989 Dependence on other enabling machines and devices: Secondary | ICD-10-CM | POA: Diagnosis not present

## 2021-12-30 DIAGNOSIS — Z96643 Presence of artificial hip joint, bilateral: Secondary | ICD-10-CM | POA: Diagnosis not present

## 2021-12-30 DIAGNOSIS — K219 Gastro-esophageal reflux disease without esophagitis: Secondary | ICD-10-CM | POA: Diagnosis not present

## 2021-12-30 DIAGNOSIS — I2102 ST elevation (STEMI) myocardial infarction involving left anterior descending coronary artery: Secondary | ICD-10-CM | POA: Diagnosis not present

## 2021-12-30 DIAGNOSIS — I420 Dilated cardiomyopathy: Secondary | ICD-10-CM | POA: Diagnosis not present

## 2021-12-30 DIAGNOSIS — I472 Ventricular tachycardia, unspecified: Secondary | ICD-10-CM | POA: Diagnosis not present

## 2021-12-30 DIAGNOSIS — I213 ST elevation (STEMI) myocardial infarction of unspecified site: Secondary | ICD-10-CM | POA: Diagnosis not present

## 2021-12-30 DIAGNOSIS — I2109 ST elevation (STEMI) myocardial infarction involving other coronary artery of anterior wall: Secondary | ICD-10-CM | POA: Diagnosis not present

## 2021-12-30 DIAGNOSIS — Z95818 Presence of other cardiac implants and grafts: Secondary | ICD-10-CM | POA: Diagnosis not present

## 2021-12-30 DIAGNOSIS — Z955 Presence of coronary angioplasty implant and graft: Secondary | ICD-10-CM | POA: Diagnosis not present

## 2021-12-30 DIAGNOSIS — I25110 Atherosclerotic heart disease of native coronary artery with unstable angina pectoris: Secondary | ICD-10-CM | POA: Diagnosis not present

## 2021-12-30 DIAGNOSIS — E782 Mixed hyperlipidemia: Secondary | ICD-10-CM | POA: Diagnosis not present

## 2021-12-30 DIAGNOSIS — R079 Chest pain, unspecified: Secondary | ICD-10-CM | POA: Diagnosis not present

## 2021-12-30 DIAGNOSIS — E039 Hypothyroidism, unspecified: Secondary | ICD-10-CM | POA: Diagnosis not present

## 2021-12-30 DIAGNOSIS — I10 Essential (primary) hypertension: Secondary | ICD-10-CM | POA: Diagnosis not present

## 2021-12-30 DIAGNOSIS — F17210 Nicotine dependence, cigarettes, uncomplicated: Secondary | ICD-10-CM | POA: Diagnosis not present

## 2021-12-30 DIAGNOSIS — Z9282 Status post administration of tPA (rtPA) in a different facility within the last 24 hours prior to admission to current facility: Secondary | ICD-10-CM | POA: Diagnosis not present

## 2021-12-30 DIAGNOSIS — R0789 Other chest pain: Secondary | ICD-10-CM | POA: Diagnosis not present

## 2021-12-30 DIAGNOSIS — I2129 ST elevation (STEMI) myocardial infarction involving other sites: Secondary | ICD-10-CM | POA: Diagnosis not present

## 2021-12-30 DIAGNOSIS — J9601 Acute respiratory failure with hypoxia: Secondary | ICD-10-CM | POA: Diagnosis not present

## 2021-12-30 DIAGNOSIS — R570 Cardiogenic shock: Secondary | ICD-10-CM | POA: Diagnosis not present

## 2021-12-30 DIAGNOSIS — I252 Old myocardial infarction: Secondary | ICD-10-CM | POA: Diagnosis not present

## 2021-12-30 DIAGNOSIS — I255 Ischemic cardiomyopathy: Secondary | ICD-10-CM | POA: Diagnosis not present

## 2022-01-03 ENCOUNTER — Other Ambulatory Visit: Payer: Self-pay | Admitting: Family

## 2022-01-07 ENCOUNTER — Telehealth: Payer: Self-pay | Admitting: Family

## 2022-01-07 NOTE — Telephone Encounter (Signed)
Pt did say that he had a urinary cathter taken out. Per pt notice that there were blood when he urinates but only towards the end when he is done. Per pt said that it's red this morning  but in the afternoon it was not as noticeable anymore. Suggest to pt to either wait until his appt 01/11/22 with PCP per pt thinks that he can wait until then as well. Advice pt we do have an on call provider if he should need to call later if it gets worse. As well as to call EMS , ER or urgent care. Per pt understand. ?

## 2022-01-10 ENCOUNTER — Inpatient Hospital Stay: Payer: Medicare Other | Admitting: Family

## 2022-01-10 DIAGNOSIS — E7800 Pure hypercholesterolemia, unspecified: Secondary | ICD-10-CM | POA: Diagnosis not present

## 2022-01-10 DIAGNOSIS — Z955 Presence of coronary angioplasty implant and graft: Secondary | ICD-10-CM | POA: Diagnosis not present

## 2022-01-10 DIAGNOSIS — I10 Essential (primary) hypertension: Secondary | ICD-10-CM | POA: Diagnosis not present

## 2022-01-10 DIAGNOSIS — Z720 Tobacco use: Secondary | ICD-10-CM | POA: Diagnosis not present

## 2022-01-10 DIAGNOSIS — I252 Old myocardial infarction: Secondary | ICD-10-CM | POA: Diagnosis not present

## 2022-01-10 DIAGNOSIS — I2510 Atherosclerotic heart disease of native coronary artery without angina pectoris: Secondary | ICD-10-CM | POA: Diagnosis not present

## 2022-01-11 ENCOUNTER — Encounter: Payer: Self-pay | Admitting: Family

## 2022-01-11 ENCOUNTER — Ambulatory Visit (INDEPENDENT_AMBULATORY_CARE_PROVIDER_SITE_OTHER): Payer: Medicare Other | Admitting: Family

## 2022-01-11 VITALS — BP 128/79 | HR 78 | Temp 97.7°F | Ht 73.0 in | Wt 198.2 lb

## 2022-01-11 DIAGNOSIS — I10 Essential (primary) hypertension: Secondary | ICD-10-CM

## 2022-01-11 DIAGNOSIS — I2510 Atherosclerotic heart disease of native coronary artery without angina pectoris: Secondary | ICD-10-CM

## 2022-01-11 DIAGNOSIS — Z87891 Personal history of nicotine dependence: Secondary | ICD-10-CM | POA: Diagnosis not present

## 2022-01-11 DIAGNOSIS — Z955 Presence of coronary angioplasty implant and graft: Secondary | ICD-10-CM | POA: Diagnosis not present

## 2022-01-11 DIAGNOSIS — Z9861 Coronary angioplasty status: Secondary | ICD-10-CM

## 2022-01-11 DIAGNOSIS — Z09 Encounter for follow-up examination after completed treatment for conditions other than malignant neoplasm: Secondary | ICD-10-CM | POA: Diagnosis not present

## 2022-01-11 NOTE — Progress Notes (Signed)
? ?Subjective:  ? ? Patient ID: Paul Murillo, male    DOB: May 25, 1956, 66 y.o.   MRN: [id number] ? ?Chief Complaint  ?Patient presents with  ? Hospitalization Follow-up  ?  For heart has heart monitor 30 day. Went to [facility].    ? ?PT presents to the office today for hospital follow up. He went to the [facility] ED on 12/30/21 for chest pain. He had a stent placed and is currently wearing LifeVest. We will request hospital notes. He was discharged on 01/04/22.  ? ?He has hx of HTN, hyperlipidemia, CAD, and prior MI. He is followed by Cardiologists yesterday. They stopped his amlodipine 5 mg and increased the metoprolol to 50 mg daily. He is to continue telmisartan 80 mg daily. His BP is at goal.  ? ?He quit smoking since hospitalization.  ? ?Denies any SOB or chest pain at this time.  ?Hypertension ?This is a chronic problem. The current episode started more than 1 year ago. The problem has been resolved since onset. The problem is controlled. Pertinent negatives include no malaise/fatigue, peripheral edema or shortness of breath. Risk factors for coronary artery disease include dyslipidemia, male gender and sedentary lifestyle. The current treatment provides moderate improvement.  ? ? ? ?Review of Systems  ?Constitutional:  Negative for malaise/fatigue.  ?Respiratory:  Negative for shortness of breath.   ?All other systems reviewed and are negative. ? ?   ?Objective:  ? Physical Exam ?Vitals reviewed.  ?Constitutional:   ?   General: He is not in acute distress. ?   Appearance: He is well-developed.  ?HENT:  ?   Head: Normocephalic.  ?   Right Ear: Tympanic membrane normal.  ?   Left Ear: Tympanic membrane normal.  ?Eyes:  ?   General:     ?   Right eye: No discharge.     ?   Left eye: No discharge.  ?   Pupils: Pupils are equal, round, and reactive to light.  ?Neck:  ?   Thyroid: No thyromegaly.  ?Cardiovascular:  ?   Rate and Rhythm: Normal rate and regular rhythm.  ?   Heart sounds:  Normal heart sounds. No murmur heard. ?   Comments: LifeVest on ?Pulmonary:  ?   Effort: Pulmonary effort is normal. No respiratory distress.  ?   Breath sounds: Normal breath sounds. No wheezing.  ?Abdominal:  ?   General: Bowel sounds are normal. There is no distension.  ?   Palpations: Abdomen is soft.  ?   Tenderness: There is no abdominal tenderness.  ?Musculoskeletal:     ?   General: No tenderness. Normal range of motion.  ?   Cervical back: Normal range of motion and neck supple.  ?Skin: ?   General: Skin is warm and dry.  ?   Findings: No erythema or rash.  ?Neurological:  ?   Mental Status: He is alert and oriented to person, place, and time.  ?   Cranial Nerves: No cranial nerve deficit.  ?   Deep Tendon Reflexes: Reflexes are normal and symmetric.  ?Psychiatric:     ?   Behavior: Behavior normal.     ?   Thought Content: Thought content normal.     ?   Judgment: Judgment normal.  ? ? ? ? ?BP 128/79   Pulse 78   Temp 97.7 ?F (36.5 ?C) (Temporal)   Ht 6' 1"  (1.854 m)   Wt 198 lb 3.2 oz (89.9 kg)  SpO2 97%   BMI 26.15 kg/m?  ? ?   ?Assessment & Plan:  ?Paul Murillo comes in today with chief complaint of Hospitalization Follow-up (For heart has heart monitor 30 day. Went to [facility].  ) ? ? ?Diagnosis and orders addressed: ? ?1. Hospital discharge follow-up ?- CMP14+EGFR ?- CBC with Differential/Platelet ? ?2. History of smoking ?- CMP14+EGFR ?- CBC with Differential/Platelet ? ?3. CAD S/P percutaneous coronary angioplasty ?- CMP14+EGFR ?- CBC with Differential/Platelet ? ?4. Primary hypertension ?- CMP14+EGFR ?- CBC with Differential/Platelet ? ?5. Hx of smoking ?Continue to sustain from smoking  ?- CMP14+EGFR ?- CBC with Differential/Platelet ? ?6. H/O heart artery stent ?- CMP14+EGFR ?- CBC with Differential/Platelet ? ? ?Labs pending ?Health Maintenance reviewed ?Diet and exercise encouraged ? ?Follow up plan: ?Keep follow up with Cardiologists  ? ? ?[name],  FNP ? ?

## 2022-01-11 NOTE — Patient Instructions (Signed)
Coronary Angiogram With Stent ?Coronary angiogram with stent placement is a procedure to widen or open a narrow blood vessel of the heart (coronary artery). Arteries may become blocked by cholesterol buildup (plaques) in the lining of the artery wall. When a coronary artery becomes partially blocked, blood flow to that area decreases. This may lead to chest pain or a heart attack (myocardial infarction). ?A stent is a small piece of metal that looks like mesh or spring. Stent placement may be done as treatment after a heart attack, or to prevent a heart attack if a blocked artery is found by a coronary angiogram. ?Let your health care provider know about: ?Any allergies you have, including allergies to medicines or contrast dye. ?All medicines you are taking, including vitamins, herbs, eye drops, creams, and over-the-counter medicines. ?Any problems you or family members have had with anesthetic medicines. ?Any blood disorders you have. ?Any surgeries you have had. ?Any medical conditions you have, including kidney problems or kidney failure. ?Whether you are pregnant or may be pregnant. ?Whether you are breastfeeding. ?What are the risks? ?Generally, this is a safe procedure. However, serious problems may occur, including: ?Damage to nearby structures or organs, such as the heart, blood vessels, or kidneys. ?A return of blockage. ?Bleeding, infection, or bruising at the insertion site. ?A collection of blood under the skin (hematoma) at the insertion site. ?A blood clot in another part of the body. ?Allergic reaction to medicines or dyes. ?Bleeding into the abdomen (retroperitoneal bleeding). ?Stroke (rare). ?Heart attack (rare). ?What happens before the procedure? ?Staying hydrated ?Follow instructions from your health care provider about hydration, which may include: ?Up to 2 hours before the procedure - you may continue to drink clear liquids, such as water, clear fruit juice, black coffee, and plain  tea. ? ?Eating and drinking restrictions ?Follow instructions from your health care provider about eating and drinking, which may include: ?8 hours before the procedure - stop eating heavy meals or foods, such as meat, fried foods, or fatty foods. ?6 hours before the procedure - stop eating light meals or foods, such as toast or cereal. ?2 hours before the procedure - stop drinking clear liquids. ?Medicines ?Ask your health care provider about: ?Changing or stopping your regular medicines. This is especially important if you are taking diabetes medicines or blood thinners. ?Taking medicines such as aspirin and ibuprofen. These medicines can thin your blood. Do not take these medicines unless your health care provider tells you to take them. ?Generally, aspirin is recommended before a thin tube, called a catheter, is passed through a blood vessel and inserted into the heart (cardiac catheterization). ?Taking over-the-counter medicines, vitamins, herbs, and supplements. ?General instructions ?Do not use any products that contain nicotine or tobacco for at least 4 weeks before the procedure. These products include cigarettes, e-cigarettes, and chewing tobacco. If you need help quitting, ask your health care provider. ?Plan to have someone take you home from the hospital or clinic. ?If you will be going home right after the procedure, plan to have someone with you for 24 hours. ?You may have tests and imaging procedures. ?Ask your health care provider: ?How your insertion site will be marked. Ask which artery will be used for the procedure. ?What steps will be taken to help prevent infection. These may include: ?Removing hair at the insertion site. ?Washing skin with a germ-killing soap. ?Taking antibiotic medicine. ?What happens during the procedure? ? ?An IV will be inserted into one of your veins. ?Electrodes  may be placed on your chest to monitor your heart rate during the procedure. ?You will be given one or more  of the following: ?A medicine to help you relax (sedative). ?A medicine to numb the area (local anesthetic) for catheter insertion. ?A small incision will be made for catheter insertion. ?The catheter will be inserted into an artery using a guide wire. The location may be in your groin, your wrist, or the fold of your arm (near your elbow). ?An X-ray procedure (fluoroscopy) will be used to help guide the catheter to the opening of the heart arteries. ?A dye will be injected into the catheter. X-rays will be taken. The dye helps to show where any narrowing or blockages are located in the arteries. ?Tell your health care provider if you have chest pain or trouble breathing. ?A tiny wire will be guided to the blocked spot, and a balloon will be inflated to make the artery wider. ?The stent will be expanded to crush the plaques into the wall of the vessel. The stent will hold the area open and improve the blood flow. Most stents have a drug coating to reduce the risk of the stent narrowing over time. ?The artery may be made wider using a drill, laser, or other tools that remove plaques. ?The catheter will be removed when the blood flow improves. The stent will stay where it was placed, and the lining of the artery will grow over it. ?A bandage (dressing) will be placed on the insertion site. Pressure will be applied to stop bleeding. ?The IV will be removed. ?This procedure may vary among health care providers and hospitals. ?What happens after the procedure? ?Your blood pressure, heart rate, breathing rate, and blood oxygen level will be monitored until you leave the hospital or clinic. ?If the procedure is done through the leg, you will lie flat in bed for a few hours or for as long as told by your health care provider. You will be instructed not to bend or cross your legs. ?The insertion site and the pulse in your foot or wrist will be checked often. ?You may have more blood tests, X-rays, and a test that records the  electrical activity of your heart (electrocardiogram, or ECG). ?Do not drive for 24 hours if you were given a sedative during your procedure. ?Summary ?Coronary angiogram with stent placement is a procedure to widen or open a narrowed coronary artery. This is done to treat heart problems. ?Before the procedure, let your health care provider know about all the medical conditions and surgeries you have or have had. ?This is a safe procedure. However, some problems may occur, including damage to nearby structures or organs, bleeding, blood clots, or allergies. ?Follow your health care provider's instructions about eating, drinking, medicines, and other lifestyle changes, such as quitting tobacco use before the procedure. ?This information is not intended to replace advice given to you by your health care provider. Make sure you discuss any questions you have with your health care provider. ?Document Revised: 05/01/2019 Document Reviewed: 05/01/2019 ?Elsevier Patient Education ? [place]. ? ?

## 2022-01-12 ENCOUNTER — Other Ambulatory Visit: Payer: Self-pay | Admitting: Family

## 2022-01-12 DIAGNOSIS — N1831 Chronic kidney disease, stage 3a: Secondary | ICD-10-CM

## 2022-01-12 LAB — CBC WITH DIFFERENTIAL/PLATELET
Basophils Absolute: 0 10*3/uL (ref 0.0–0.2)
Basos: 0 %
EOS (ABSOLUTE): 0.5 10*3/uL — ABNORMAL HIGH (ref 0.0–0.4)
Eos: 5 %
Hematocrit: 35.8 % — ABNORMAL LOW (ref 37.5–51.0)
Hemoglobin: 12 g/dL — ABNORMAL LOW (ref 13.0–17.7)
Immature Grans (Abs): 0 10*3/uL (ref 0.0–0.1)
Immature Granulocytes: 0 %
Lymphocytes Absolute: 2.4 10*3/uL (ref 0.7–3.1)
Lymphs: 26 %
MCH: 31 pg (ref 26.6–33.0)
MCHC: 33.5 g/dL (ref 31.5–35.7)
MCV: 93 fL (ref 79–97)
Monocytes Absolute: 0.8 10*3/uL (ref 0.1–0.9)
Monocytes: 8 %
Neutrophils Absolute: 5.6 10*3/uL (ref 1.4–7.0)
Neutrophils: 61 %
Platelets: 391 10*3/uL (ref 150–450)
RBC: 3.87 x10E6/uL — ABNORMAL LOW (ref 4.14–5.80)
RDW: 13.2 % (ref 11.6–15.4)
WBC: 9.3 10*3/uL (ref 3.4–10.8)

## 2022-01-12 LAB — CMP14+EGFR
ALT: 22 IU/L (ref 0–44)
AST: 18 IU/L (ref 0–40)
Albumin/Globulin Ratio: 1.4 (ref 1.2–2.2)
Albumin: 4.2 g/dL (ref 3.8–4.8)
Alkaline Phosphatase: 86 IU/L (ref 44–121)
BUN/Creatinine Ratio: 9 — ABNORMAL LOW (ref 10–24)
BUN: 14 mg/dL (ref 8–27)
Bilirubin Total: 0.4 mg/dL (ref 0.0–1.2)
CO2: 20 mmol/L (ref 20–29)
Calcium: 9.5 mg/dL (ref 8.6–10.2)
Chloride: 102 mmol/L (ref 96–106)
Creatinine, Ser: 1.51 mg/dL — ABNORMAL HIGH (ref 0.76–1.27)
Globulin, Total: 2.9 g/dL (ref 1.5–4.5)
Glucose: 124 mg/dL — ABNORMAL HIGH (ref 70–99)
Potassium: 4.4 mmol/L (ref 3.5–5.2)
Sodium: 139 mmol/L (ref 134–144)
Total Protein: 7.1 g/dL (ref 6.0–8.5)
eGFR: 50 mL/min/{1.73_m2} — ABNORMAL LOW (ref >59)

## 2022-01-13 ENCOUNTER — Other Ambulatory Visit: Payer: Self-pay | Admitting: Family

## 2022-01-13 DIAGNOSIS — I10 Essential (primary) hypertension: Secondary | ICD-10-CM

## 2022-02-03 ENCOUNTER — Encounter: Payer: Self-pay | Admitting: Family Medicine

## 2022-02-03 ENCOUNTER — Other Ambulatory Visit: Payer: Medicare Other

## 2022-02-03 ENCOUNTER — Ambulatory Visit (INDEPENDENT_AMBULATORY_CARE_PROVIDER_SITE_OTHER): Payer: Medicare Other

## 2022-02-03 ENCOUNTER — Ambulatory Visit (INDEPENDENT_AMBULATORY_CARE_PROVIDER_SITE_OTHER): Payer: Medicare Other | Admitting: Family Medicine

## 2022-02-03 VITALS — BP 88/46 | HR 57 | Temp 97.9°F | Resp 16 | Ht 73.0 in | Wt 193.6 lb

## 2022-02-03 DIAGNOSIS — Z9861 Coronary angioplasty status: Secondary | ICD-10-CM

## 2022-02-03 DIAGNOSIS — S2232XA Fracture of one rib, left side, initial encounter for closed fracture: Secondary | ICD-10-CM | POA: Diagnosis not present

## 2022-02-03 DIAGNOSIS — R42 Dizziness and giddiness: Secondary | ICD-10-CM

## 2022-02-03 DIAGNOSIS — R001 Bradycardia, unspecified: Secondary | ICD-10-CM | POA: Diagnosis not present

## 2022-02-03 DIAGNOSIS — R0781 Pleurodynia: Secondary | ICD-10-CM

## 2022-02-03 DIAGNOSIS — N1831 Chronic kidney disease, stage 3a: Secondary | ICD-10-CM | POA: Diagnosis not present

## 2022-02-03 DIAGNOSIS — S2242XA Multiple fractures of ribs, left side, initial encounter for closed fracture: Secondary | ICD-10-CM | POA: Diagnosis not present

## 2022-02-03 DIAGNOSIS — I959 Hypotension, unspecified: Secondary | ICD-10-CM | POA: Diagnosis not present

## 2022-02-03 DIAGNOSIS — I2510 Atherosclerotic heart disease of native coronary artery without angina pectoris: Secondary | ICD-10-CM

## 2022-02-03 LAB — CBC WITH DIFFERENTIAL/PLATELET
Basophils Absolute: 0.1 10*3/uL (ref 0.0–0.2)
Basos: 1 %
EOS (ABSOLUTE): 0.4 10*3/uL (ref 0.0–0.4)
Eos: 5 %
Hematocrit: 39.2 % (ref 37.5–51.0)
Hemoglobin: 13 g/dL (ref 13.0–17.7)
Immature Grans (Abs): 0 10*3/uL (ref 0.0–0.1)
Immature Granulocytes: 0 %
Lymphocytes Absolute: 2.9 10*3/uL (ref 0.7–3.1)
Lymphs: 31 %
MCH: 30.7 pg (ref 26.6–33.0)
MCHC: 33.2 g/dL (ref 31.5–35.7)
MCV: 93 fL (ref 79–97)
Monocytes Absolute: 0.8 10*3/uL (ref 0.1–0.9)
Monocytes: 9 %
Neutrophils Absolute: 5 10*3/uL (ref 1.4–7.0)
Neutrophils: 54 %
Platelets: 254 10*3/uL (ref 150–450)
RBC: 4.23 x10E6/uL (ref 4.14–5.80)
RDW: 13.8 % (ref 11.6–15.4)
WBC: 9.2 10*3/uL (ref 3.4–10.8)

## 2022-02-03 MED ORDER — LIDOCAINE 4 % EX PTCH: 1.0000 | MEDICATED_PATCH | CUTANEOUS | 0 refills | Status: DC

## 2022-02-03 MED ORDER — METOPROLOL SUCCINATE ER 25 MG PO TB24: 25.0000 mg | ORAL_TABLET | Freq: Every day | ORAL | 3 refills | Status: DC

## 2022-02-03 NOTE — Patient Instructions (Signed)
Call to make follow up with cardiology.  ?Stop amlodipine (Norvasc) if still taking.  ?Decrease Toprol dose to 25 mg daily.  ?Keep log of BP and HR and take to follow up with cardiology.  ? ?

## 2022-02-03 NOTE — Progress Notes (Signed)
?  ? ?Subjective:  ?Patient ID: Paul Murillo, male    DOB: 10-Aug-1956, 66 y.o.   MRN: [id number] ? ?Patient Care Team: ?Sharion Balloon, FNP as PCP - General (Family Medicine) ?Ceasar Mons, MD as Consulting Physician (Urology) ?Ebbie Ridge, MD as Referring Physician (Cardiology)  ? ?Chief Complaint:  Chest Pain (Rib Pain, Fell on Floor in [place]) ? ? ?HPI: ?Paul Murillo is a 66 y.o. male presenting on 02/03/2022 for Chest Pain (Rib Pain, [place] on Floor in [place]) ? ? ?Patient presents today for evaluation of left rib pain after a fall on Sunday.  States he was getting out of the shower and the next thing he knew he was in the floor.  He states he does not know if he passed out or just became dizzy and fell.  He denied any chest pain or shortness of breath at the time.  He states he has continued pain to his left lower anterior ribs.  Has been taking Tylenol with some relief of symptoms.  He is noted to be hypotensive and bradycardic in office today.  Denies dizziness, chest pain, shortness of breath, fatigue, palpitations, orthopnea, PND, or syncope today.  He was recently seen by cardiology and he was to stop his amlodipine and increase the dose of his metoprolol succinate.  He has been taking 50 mg metoprolol succinate daily.  He reports his blood pressure has been running below 100 on a consistent basis over the last few days at home.  He currently is wearing a ZOLL LifeVest. ? ? ? ?Relevant past medical, surgical, family, and social history reviewed and updated as indicated.  ?Allergies and medications reviewed and updated. Data reviewed: Chart in Epic. ? ? ?Past Medical History:  ?Diagnosis Date  ? Cancer [facility])   ? Prostate  ? Coronary artery disease   ? ED (erectile dysfunction)   ? Genitourinary disorder   ? Headache   ? Hypercholesteremia   ? Hypertension   ? MI (myocardial infarction) ([place])   ? Reflux   ? Syncope   ? Vitamin D deficiency   ? ? ?Past Surgical History:  ?Procedure  Laterality Date  ? APPENDECTOMY    ? HERNIA REPAIR    ? LEFT HEART CATHETERIZATION WITH CORONARY ANGIOGRAM N/A 01/29/2014  ? Procedure: LEFT HEART CATHETERIZATION WITH CORONARY ANGIOGRAM;  Surgeon: Peter M Martinique, MD;  Location: [facility] CATH LAB;  Service: Cardiovascular;  Laterality: N/A;  ? TOTAL HIP ARTHROPLASTY    ? ? ?Social History  ? ?Socioeconomic History  ? Marital status: Married  ?  Spouse name: Thayer Headings  ? Number of children: 2  ? Years of education: Not on file  ? Highest education level: 12th grade  ?Occupational History  ? Occupation: Retired  ?Tobacco Use  ? Smoking status: Former  ?  Packs/day: 0.25  ?  Years: 50.00  ?  Pack years: 12.50  ?  Types: Cigarettes  ?  Quit date: 12/30/2021  ?  Years since quitting: 0.0  ? Smokeless tobacco: Never  ? Tobacco comments:  ?  Quit 2021 - started back  ?Vaping Use  ? Vaping Use: Never used  ?Substance and Sexual Activity  ? Alcohol use: Yes  ?  Alcohol/week: 0.0 standard drinks  ?  Comment: occ.  ? Drug use: No  ? Sexual activity: Yes  ?Other Topics Concern  ? Not on file  ?Social History Narrative  ? Children live in [place]  ? ?Social Determinants of Health  ? ?  Financial Resource Strain: Low Risk   ? Difficulty of Paying Living Expenses: Not hard at all  ?Food Insecurity: No Food Insecurity  ? Worried About Charity fundraiser in the Last Year: Never true  ? Ran Out of Food in the Last Year: Never true  ?Transportation Needs: No Transportation Needs  ? Lack of Transportation (Medical): No  ? Lack of Transportation (Non-Medical): No  ?Physical Activity: Inactive  ? Days of Exercise per Week: 0 days  ? Minutes of Exercise per Session: 0 min  ?Stress: No Stress Concern Present  ? Feeling of Stress : Not at all  ?Social Connections: Moderately Integrated  ? Frequency of Communication with Friends and Family: More than three times a week  ? Frequency of Social Gatherings with Friends and Family: More than three times a week  ? Attends Religious Services: More than 4 times per  year  ? Active Member of Clubs or Organizations: No  ? Attends Archivist Meetings: Never  ? Marital Status: Married  ?Intimate Partner Violence: Not At Risk  ? Fear of Current or Ex-Partner: No  ? Emotionally Abused: No  ? Physically Abused: No  ? Sexually Abused: No  ? ? ?Outpatient Encounter Medications as of 02/03/2022  ?Medication Sig  ? alfuzosin (UROXATRAL) 10 MG 24 hr tablet   ? aspirin 81 MG EC tablet Take 81 mg by mouth daily.  ? atorvastatin (LIPITOR) 80 MG tablet TAKE 1 TABLET BY MOUTH EVERY DAY  ? dapagliflozin propanediol (FARXIGA) 10 MG TABS tablet Take by mouth daily.  ? levothyroxine (SYNTHROID) 75 MCG tablet TAKE 1 TABLET BY MOUTH EVERY DAY  ? Lidocaine (HM LIDOCAINE PATCH) 4 % PTCH Apply 1 patch topically daily.  ? metoprolol succinate (TOPROL-XL) 25 MG 24 hr tablet Take 1 tablet (25 mg total) by mouth daily.  ? nitroGLYCERIN (NITROSTAT) 0.4 MG SL tablet Place 1 tablet (0.4 mg total) under the tongue every 5 (five) minutes as needed for chest pain. Reported on 04/01/2016  ? omeprazole (PRILOSEC) 20 MG capsule Once a day  ? oxybutynin (DITROPAN-XL) 10 MG 24 hr tablet Take 1 tablet by mouth daily.  ? prasugrel (EFFIENT) 10 MG TABS tablet Take 1 tablet by mouth daily.  ? tamsulosin (FLOMAX) 0.4 MG CAPS capsule TAKE 1 CAPSULE BY MOUTH EVERY DAY  ? telmisartan (MICARDIS) 80 MG tablet TAKE 1 TABLET BY MOUTH EVERY DAY  ? vardenafil (LEVITRA) 20 MG tablet Take 1 tablet (20 mg total) by mouth daily as needed for erectile dysfunction.  ? Vitamin D, Ergocalciferol, (DRISDOL) 1.25 MG (50000 UNIT) CAPS capsule TAKE 1 CAPSULE (50,000 UNITS TOTAL) BY MOUTH EVERY 7 (SEVEN) DAYS  ? [DISCONTINUED] metoprolol succinate (TOPROL-XL) 50 MG 24 hr tablet Take 50 mg by mouth daily.  ? [DISCONTINUED] amLODipine (NORVASC) 5 MG tablet Take 5 mg by mouth daily.  ? ?No facility-administered encounter medications on file as of 02/03/2022.  ? ? ?No Known Allergies ? ?Review of Systems  ?Constitutional:  Negative for  activity change, appetite change, chills, diaphoresis, fatigue, fever and unexpected weight change.  ?HENT: Negative.    ?Eyes: Negative.   ?Respiratory:  Negative for apnea, cough, choking, chest tightness, shortness of breath, wheezing and stridor.   ?Cardiovascular:  Negative for chest pain, palpitations and leg swelling.  ?Gastrointestinal:  Negative for abdominal pain, blood in stool, constipation, diarrhea, nausea and vomiting.  ?Endocrine: Negative.   ?Genitourinary:  Negative for decreased urine volume, difficulty urinating, dysuria, frequency and urgency.  ?Musculoskeletal:  Positive for arthralgias and myalgias. Negative for back pain, gait problem, joint swelling, neck pain and neck stiffness.  ?Skin: Negative.   ?Allergic/Immunologic: Negative.   ?Neurological:  Positive for dizziness and light-headedness. Negative for tremors, seizures, syncope, facial asymmetry, speech difficulty, weakness, numbness and headaches.  ?Hematological: Negative.   ?Psychiatric/Behavioral:  Negative for confusion, hallucinations, sleep disturbance and suicidal ideas.   ?All other systems reviewed and are negative. ? ?   ? ?Objective:  ?BP (!) 88/46   Pulse (!) 57   Temp 97.9 ?F (36.6 ?C)   Resp 16   Ht '6\' 1"'$  (1.854 m)   Wt 193 lb 9.6 oz (87.8 kg)   SpO2 98%   BMI 25.54 kg/m?   ? ?Wt Readings from Last 3 Encounters:  ?02/03/22 193 lb 9.6 oz (87.8 kg)  ?01/11/22 198 lb 3.2 oz (89.9 kg)  ?11/25/21 196 lb (88.9 kg)  ? ? ?Physical Exam ?Vitals and nursing note reviewed.  ?Constitutional:   ?   General: He is not in acute distress. ?   Appearance: Normal appearance. He is well-developed. He is not ill-appearing, toxic-appearing or diaphoretic.  ?HENT:  ?   Head: Normocephalic and atraumatic.  ?   Mouth/Throat:  ?   Mouth: Mucous membranes are moist.  ?Eyes:  ?   Conjunctiva/sclera: Conjunctivae normal.  ?   Pupils: Pupils are equal, round, and reactive to light.  ?Cardiovascular:  ?   Rate and Rhythm: Regular rhythm.  Bradycardia present.  ?   Heart sounds: Normal heart sounds.  ?   Comments: ZOLL LifeVest on ?Pulmonary:  ?   Effort: Pulmonary effort is normal.  ?   Breath sounds: Normal breath sounds.  ?Chest:  ?   Chest wall: Tenderness

## 2022-02-04 DIAGNOSIS — I420 Dilated cardiomyopathy: Secondary | ICD-10-CM | POA: Diagnosis not present

## 2022-02-04 DIAGNOSIS — I2102 ST elevation (STEMI) myocardial infarction involving left anterior descending coronary artery: Secondary | ICD-10-CM | POA: Diagnosis not present

## 2022-02-04 LAB — BMP8+EGFR
BUN/Creatinine Ratio: 12 (ref 10–24)
BUN: 19 mg/dL (ref 8–27)
CO2: 18 mmol/L — ABNORMAL LOW (ref 20–29)
Calcium: 9.9 mg/dL (ref 8.6–10.2)
Chloride: 106 mmol/L (ref 96–106)
Creatinine, Ser: 1.55 mg/dL — ABNORMAL HIGH (ref 0.76–1.27)
Glucose: 142 mg/dL — ABNORMAL HIGH (ref 70–99)
Potassium: 4.4 mmol/L (ref 3.5–5.2)
Sodium: 142 mmol/L (ref 134–144)
eGFR: 49 mL/min/{1.73_m2} — ABNORMAL LOW (ref >59)

## 2022-02-10 DIAGNOSIS — Z720 Tobacco use: Secondary | ICD-10-CM | POA: Diagnosis not present

## 2022-02-10 DIAGNOSIS — I2510 Atherosclerotic heart disease of native coronary artery without angina pectoris: Secondary | ICD-10-CM | POA: Diagnosis not present

## 2022-02-10 DIAGNOSIS — E7800 Pure hypercholesterolemia, unspecified: Secondary | ICD-10-CM | POA: Diagnosis not present

## 2022-02-10 DIAGNOSIS — I252 Old myocardial infarction: Secondary | ICD-10-CM | POA: Diagnosis not present

## 2022-02-10 DIAGNOSIS — I10 Essential (primary) hypertension: Secondary | ICD-10-CM | POA: Diagnosis not present

## 2022-02-10 DIAGNOSIS — Z955 Presence of coronary angioplasty implant and graft: Secondary | ICD-10-CM | POA: Diagnosis not present

## 2022-02-13 DIAGNOSIS — N390 Urinary tract infection, site not specified: Secondary | ICD-10-CM | POA: Diagnosis not present

## 2022-02-13 DIAGNOSIS — E119 Type 2 diabetes mellitus without complications: Secondary | ICD-10-CM | POA: Diagnosis not present

## 2022-02-13 DIAGNOSIS — Z7902 Long term (current) use of antithrombotics/antiplatelets: Secondary | ICD-10-CM | POA: Diagnosis not present

## 2022-02-13 DIAGNOSIS — M79651 Pain in right thigh: Secondary | ICD-10-CM | POA: Diagnosis not present

## 2022-02-13 DIAGNOSIS — K219 Gastro-esophageal reflux disease without esophagitis: Secondary | ICD-10-CM | POA: Diagnosis not present

## 2022-02-13 DIAGNOSIS — Z955 Presence of coronary angioplasty implant and graft: Secondary | ICD-10-CM | POA: Diagnosis not present

## 2022-02-13 DIAGNOSIS — S76311A Strain of muscle, fascia and tendon of the posterior muscle group at thigh level, right thigh, initial encounter: Secondary | ICD-10-CM | POA: Diagnosis not present

## 2022-02-13 DIAGNOSIS — R109 Unspecified abdominal pain: Secondary | ICD-10-CM | POA: Diagnosis not present

## 2022-02-13 DIAGNOSIS — T8484XA Pain due to internal orthopedic prosthetic devices, implants and grafts, initial encounter: Secondary | ICD-10-CM | POA: Diagnosis not present

## 2022-02-13 DIAGNOSIS — E7800 Pure hypercholesterolemia, unspecified: Secondary | ICD-10-CM | POA: Diagnosis not present

## 2022-02-13 DIAGNOSIS — M25551 Pain in right hip: Secondary | ICD-10-CM | POA: Diagnosis not present

## 2022-02-13 DIAGNOSIS — I252 Old myocardial infarction: Secondary | ICD-10-CM | POA: Diagnosis not present

## 2022-02-13 DIAGNOSIS — I2510 Atherosclerotic heart disease of native coronary artery without angina pectoris: Secondary | ICD-10-CM | POA: Diagnosis not present

## 2022-02-13 DIAGNOSIS — Z79899 Other long term (current) drug therapy: Secondary | ICD-10-CM | POA: Diagnosis not present

## 2022-02-13 DIAGNOSIS — Z1629 Resistance to other single specified antibiotic: Secondary | ICD-10-CM | POA: Diagnosis not present

## 2022-02-13 DIAGNOSIS — Z9889 Other specified postprocedural states: Secondary | ICD-10-CM | POA: Diagnosis not present

## 2022-02-13 DIAGNOSIS — R55 Syncope and collapse: Secondary | ICD-10-CM | POA: Diagnosis not present

## 2022-02-13 DIAGNOSIS — I5020 Unspecified systolic (congestive) heart failure: Secondary | ICD-10-CM | POA: Diagnosis not present

## 2022-02-13 DIAGNOSIS — Z87891 Personal history of nicotine dependence: Secondary | ICD-10-CM | POA: Diagnosis not present

## 2022-02-13 DIAGNOSIS — E785 Hyperlipidemia, unspecified: Secondary | ICD-10-CM | POA: Diagnosis not present

## 2022-02-13 DIAGNOSIS — R7881 Bacteremia: Secondary | ICD-10-CM | POA: Diagnosis not present

## 2022-02-13 DIAGNOSIS — I959 Hypotension, unspecified: Secondary | ICD-10-CM | POA: Diagnosis not present

## 2022-02-13 DIAGNOSIS — I340 Nonrheumatic mitral (valve) insufficiency: Secondary | ICD-10-CM | POA: Diagnosis not present

## 2022-02-13 DIAGNOSIS — M009 Pyogenic arthritis, unspecified: Secondary | ICD-10-CM | POA: Diagnosis not present

## 2022-02-13 DIAGNOSIS — N179 Acute kidney failure, unspecified: Secondary | ICD-10-CM | POA: Diagnosis not present

## 2022-02-13 DIAGNOSIS — S2242XA Multiple fractures of ribs, left side, initial encounter for closed fracture: Secondary | ICD-10-CM | POA: Diagnosis not present

## 2022-02-13 DIAGNOSIS — Z471 Aftercare following joint replacement surgery: Secondary | ICD-10-CM | POA: Diagnosis not present

## 2022-02-13 DIAGNOSIS — S76111A Strain of right quadriceps muscle, fascia and tendon, initial encounter: Secondary | ICD-10-CM | POA: Diagnosis not present

## 2022-02-13 DIAGNOSIS — Z452 Encounter for adjustment and management of vascular access device: Secondary | ICD-10-CM | POA: Diagnosis not present

## 2022-02-13 DIAGNOSIS — R42 Dizziness and giddiness: Secondary | ICD-10-CM | POA: Diagnosis not present

## 2022-02-13 DIAGNOSIS — Z7982 Long term (current) use of aspirin: Secondary | ICD-10-CM | POA: Diagnosis not present

## 2022-02-13 DIAGNOSIS — M47816 Spondylosis without myelopathy or radiculopathy, lumbar region: Secondary | ICD-10-CM | POA: Diagnosis not present

## 2022-02-13 DIAGNOSIS — A419 Sepsis, unspecified organism: Secondary | ICD-10-CM | POA: Diagnosis not present

## 2022-02-13 DIAGNOSIS — T8451XD Infection and inflammatory reaction due to internal right hip prosthesis, subsequent encounter: Secondary | ICD-10-CM | POA: Diagnosis not present

## 2022-02-13 DIAGNOSIS — I10 Essential (primary) hypertension: Secondary | ICD-10-CM | POA: Diagnosis not present

## 2022-02-13 DIAGNOSIS — M00851 Arthritis due to other bacteria, right hip: Secondary | ICD-10-CM | POA: Diagnosis not present

## 2022-02-13 DIAGNOSIS — Z923 Personal history of irradiation: Secondary | ICD-10-CM | POA: Diagnosis not present

## 2022-02-13 DIAGNOSIS — M25461 Effusion, right knee: Secondary | ICD-10-CM | POA: Diagnosis not present

## 2022-02-13 DIAGNOSIS — M25572 Pain in left ankle and joints of left foot: Secondary | ICD-10-CM | POA: Diagnosis not present

## 2022-02-13 DIAGNOSIS — S2232XA Fracture of one rib, left side, initial encounter for closed fracture: Secondary | ICD-10-CM | POA: Diagnosis not present

## 2022-02-13 DIAGNOSIS — Z7409 Other reduced mobility: Secondary | ICD-10-CM | POA: Diagnosis not present

## 2022-02-13 DIAGNOSIS — S2242XD Multiple fractures of ribs, left side, subsequent encounter for fracture with routine healing: Secondary | ICD-10-CM | POA: Diagnosis not present

## 2022-02-13 DIAGNOSIS — T8451XA Infection and inflammatory reaction due to internal right hip prosthesis, initial encounter: Secondary | ICD-10-CM | POA: Diagnosis not present

## 2022-02-13 DIAGNOSIS — Z96641 Presence of right artificial hip joint: Secondary | ICD-10-CM | POA: Diagnosis not present

## 2022-02-13 DIAGNOSIS — Z96643 Presence of artificial hip joint, bilateral: Secondary | ICD-10-CM | POA: Diagnosis not present

## 2022-02-13 DIAGNOSIS — E039 Hypothyroidism, unspecified: Secondary | ICD-10-CM | POA: Diagnosis not present

## 2022-02-13 DIAGNOSIS — Z1623 Resistance to quinolones and fluoroquinolones: Secondary | ICD-10-CM | POA: Diagnosis not present

## 2022-02-13 DIAGNOSIS — Z789 Other specified health status: Secondary | ICD-10-CM | POA: Diagnosis not present

## 2022-02-14 DIAGNOSIS — I10 Essential (primary) hypertension: Secondary | ICD-10-CM | POA: Diagnosis not present

## 2022-02-14 DIAGNOSIS — S76111A Strain of right quadriceps muscle, fascia and tendon, initial encounter: Secondary | ICD-10-CM | POA: Diagnosis not present

## 2022-02-14 DIAGNOSIS — R42 Dizziness and giddiness: Secondary | ICD-10-CM | POA: Diagnosis not present

## 2022-02-14 DIAGNOSIS — I2510 Atherosclerotic heart disease of native coronary artery without angina pectoris: Secondary | ICD-10-CM | POA: Diagnosis not present

## 2022-02-14 DIAGNOSIS — E785 Hyperlipidemia, unspecified: Secondary | ICD-10-CM | POA: Diagnosis not present

## 2022-02-14 DIAGNOSIS — R55 Syncope and collapse: Secondary | ICD-10-CM | POA: Diagnosis not present

## 2022-02-14 DIAGNOSIS — S76311A Strain of muscle, fascia and tendon of the posterior muscle group at thigh level, right thigh, initial encounter: Secondary | ICD-10-CM | POA: Diagnosis not present

## 2022-02-14 DIAGNOSIS — M25551 Pain in right hip: Secondary | ICD-10-CM | POA: Diagnosis not present

## 2022-02-14 DIAGNOSIS — S2242XD Multiple fractures of ribs, left side, subsequent encounter for fracture with routine healing: Secondary | ICD-10-CM | POA: Diagnosis not present

## 2022-02-15 DIAGNOSIS — R42 Dizziness and giddiness: Secondary | ICD-10-CM | POA: Diagnosis not present

## 2022-02-15 DIAGNOSIS — I2510 Atherosclerotic heart disease of native coronary artery without angina pectoris: Secondary | ICD-10-CM | POA: Diagnosis not present

## 2022-02-15 DIAGNOSIS — R55 Syncope and collapse: Secondary | ICD-10-CM | POA: Diagnosis not present

## 2022-02-15 DIAGNOSIS — E785 Hyperlipidemia, unspecified: Secondary | ICD-10-CM | POA: Diagnosis not present

## 2022-02-15 DIAGNOSIS — I10 Essential (primary) hypertension: Secondary | ICD-10-CM | POA: Diagnosis not present

## 2022-02-15 DIAGNOSIS — Z96641 Presence of right artificial hip joint: Secondary | ICD-10-CM | POA: Diagnosis not present

## 2022-02-15 DIAGNOSIS — T8484XA Pain due to internal orthopedic prosthetic devices, implants and grafts, initial encounter: Secondary | ICD-10-CM | POA: Diagnosis not present

## 2022-02-15 DIAGNOSIS — S2242XD Multiple fractures of ribs, left side, subsequent encounter for fracture with routine healing: Secondary | ICD-10-CM | POA: Diagnosis not present

## 2022-02-15 DIAGNOSIS — T8451XA Infection and inflammatory reaction due to internal right hip prosthesis, initial encounter: Secondary | ICD-10-CM | POA: Diagnosis not present

## 2022-02-15 DIAGNOSIS — M25551 Pain in right hip: Secondary | ICD-10-CM | POA: Diagnosis not present

## 2022-02-16 DIAGNOSIS — T8484XA Pain due to internal orthopedic prosthetic devices, implants and grafts, initial encounter: Secondary | ICD-10-CM | POA: Diagnosis not present

## 2022-02-16 DIAGNOSIS — A419 Sepsis, unspecified organism: Secondary | ICD-10-CM | POA: Diagnosis not present

## 2022-02-16 DIAGNOSIS — M25551 Pain in right hip: Secondary | ICD-10-CM | POA: Diagnosis not present

## 2022-02-16 DIAGNOSIS — T8451XA Infection and inflammatory reaction due to internal right hip prosthesis, initial encounter: Secondary | ICD-10-CM | POA: Diagnosis not present

## 2022-02-16 DIAGNOSIS — I10 Essential (primary) hypertension: Secondary | ICD-10-CM | POA: Diagnosis not present

## 2022-02-16 DIAGNOSIS — N179 Acute kidney failure, unspecified: Secondary | ICD-10-CM | POA: Diagnosis not present

## 2022-02-16 DIAGNOSIS — Z96641 Presence of right artificial hip joint: Secondary | ICD-10-CM | POA: Diagnosis not present

## 2022-02-16 DIAGNOSIS — M009 Pyogenic arthritis, unspecified: Secondary | ICD-10-CM | POA: Diagnosis not present

## 2022-02-16 DIAGNOSIS — I2510 Atherosclerotic heart disease of native coronary artery without angina pectoris: Secondary | ICD-10-CM | POA: Diagnosis not present

## 2022-02-16 DIAGNOSIS — Z923 Personal history of irradiation: Secondary | ICD-10-CM | POA: Diagnosis not present

## 2022-02-16 DIAGNOSIS — E785 Hyperlipidemia, unspecified: Secondary | ICD-10-CM | POA: Diagnosis not present

## 2022-02-16 DIAGNOSIS — N390 Urinary tract infection, site not specified: Secondary | ICD-10-CM | POA: Diagnosis not present

## 2022-02-17 DIAGNOSIS — Z923 Personal history of irradiation: Secondary | ICD-10-CM | POA: Diagnosis not present

## 2022-02-17 DIAGNOSIS — N179 Acute kidney failure, unspecified: Secondary | ICD-10-CM | POA: Diagnosis not present

## 2022-02-17 DIAGNOSIS — M009 Pyogenic arthritis, unspecified: Secondary | ICD-10-CM | POA: Diagnosis not present

## 2022-02-17 DIAGNOSIS — I340 Nonrheumatic mitral (valve) insufficiency: Secondary | ICD-10-CM | POA: Diagnosis not present

## 2022-02-17 DIAGNOSIS — I2510 Atherosclerotic heart disease of native coronary artery without angina pectoris: Secondary | ICD-10-CM | POA: Diagnosis not present

## 2022-02-17 DIAGNOSIS — N390 Urinary tract infection, site not specified: Secondary | ICD-10-CM | POA: Diagnosis not present

## 2022-02-17 DIAGNOSIS — I10 Essential (primary) hypertension: Secondary | ICD-10-CM | POA: Diagnosis not present

## 2022-02-17 DIAGNOSIS — M25551 Pain in right hip: Secondary | ICD-10-CM | POA: Diagnosis not present

## 2022-02-17 DIAGNOSIS — T8451XA Infection and inflammatory reaction due to internal right hip prosthesis, initial encounter: Secondary | ICD-10-CM | POA: Diagnosis not present

## 2022-02-17 DIAGNOSIS — E785 Hyperlipidemia, unspecified: Secondary | ICD-10-CM | POA: Diagnosis not present

## 2022-02-17 DIAGNOSIS — A419 Sepsis, unspecified organism: Secondary | ICD-10-CM | POA: Diagnosis not present

## 2022-02-17 DIAGNOSIS — I5020 Unspecified systolic (congestive) heart failure: Secondary | ICD-10-CM | POA: Diagnosis not present

## 2022-02-18 DIAGNOSIS — T8451XA Infection and inflammatory reaction due to internal right hip prosthesis, initial encounter: Secondary | ICD-10-CM | POA: Diagnosis not present

## 2022-02-18 DIAGNOSIS — M25551 Pain in right hip: Secondary | ICD-10-CM | POA: Diagnosis not present

## 2022-02-18 DIAGNOSIS — I2510 Atherosclerotic heart disease of native coronary artery without angina pectoris: Secondary | ICD-10-CM | POA: Diagnosis not present

## 2022-02-18 DIAGNOSIS — E785 Hyperlipidemia, unspecified: Secondary | ICD-10-CM | POA: Diagnosis not present

## 2022-02-18 DIAGNOSIS — M00851 Arthritis due to other bacteria, right hip: Secondary | ICD-10-CM | POA: Diagnosis not present

## 2022-02-18 DIAGNOSIS — I10 Essential (primary) hypertension: Secondary | ICD-10-CM | POA: Diagnosis not present

## 2022-02-18 DIAGNOSIS — Z1629 Resistance to other single specified antibiotic: Secondary | ICD-10-CM | POA: Diagnosis not present

## 2022-02-18 DIAGNOSIS — Z923 Personal history of irradiation: Secondary | ICD-10-CM | POA: Diagnosis not present

## 2022-02-18 DIAGNOSIS — N390 Urinary tract infection, site not specified: Secondary | ICD-10-CM | POA: Diagnosis not present

## 2022-02-18 DIAGNOSIS — N179 Acute kidney failure, unspecified: Secondary | ICD-10-CM | POA: Diagnosis not present

## 2022-02-19 DIAGNOSIS — I10 Essential (primary) hypertension: Secondary | ICD-10-CM | POA: Diagnosis not present

## 2022-02-19 DIAGNOSIS — E785 Hyperlipidemia, unspecified: Secondary | ICD-10-CM | POA: Diagnosis not present

## 2022-02-19 DIAGNOSIS — M25551 Pain in right hip: Secondary | ICD-10-CM | POA: Diagnosis not present

## 2022-02-19 DIAGNOSIS — N179 Acute kidney failure, unspecified: Secondary | ICD-10-CM | POA: Diagnosis not present

## 2022-02-26 DIAGNOSIS — Z7409 Other reduced mobility: Secondary | ICD-10-CM | POA: Diagnosis not present

## 2022-02-26 DIAGNOSIS — Z789 Other specified health status: Secondary | ICD-10-CM | POA: Diagnosis not present

## 2022-02-28 DIAGNOSIS — N390 Urinary tract infection, site not specified: Secondary | ICD-10-CM | POA: Diagnosis not present

## 2022-02-28 DIAGNOSIS — A419 Sepsis, unspecified organism: Secondary | ICD-10-CM | POA: Diagnosis not present

## 2022-02-28 DIAGNOSIS — M00851 Arthritis due to other bacteria, right hip: Secondary | ICD-10-CM | POA: Diagnosis not present

## 2022-02-28 DIAGNOSIS — M00852 Arthritis due to other bacteria, left hip: Secondary | ICD-10-CM | POA: Diagnosis not present

## 2022-02-28 DIAGNOSIS — E7800 Pure hypercholesterolemia, unspecified: Secondary | ICD-10-CM | POA: Diagnosis not present

## 2022-02-28 DIAGNOSIS — I252 Old myocardial infarction: Secondary | ICD-10-CM | POA: Diagnosis not present

## 2022-02-28 DIAGNOSIS — Z79891 Long term (current) use of opiate analgesic: Secondary | ICD-10-CM | POA: Diagnosis not present

## 2022-02-28 DIAGNOSIS — K219 Gastro-esophageal reflux disease without esophagitis: Secondary | ICD-10-CM | POA: Diagnosis not present

## 2022-02-28 DIAGNOSIS — N179 Acute kidney failure, unspecified: Secondary | ICD-10-CM | POA: Diagnosis not present

## 2022-02-28 DIAGNOSIS — Z452 Encounter for adjustment and management of vascular access device: Secondary | ICD-10-CM | POA: Diagnosis not present

## 2022-02-28 DIAGNOSIS — Z7984 Long term (current) use of oral hypoglycemic drugs: Secondary | ICD-10-CM | POA: Diagnosis not present

## 2022-02-28 DIAGNOSIS — Z792 Long term (current) use of antibiotics: Secondary | ICD-10-CM | POA: Diagnosis not present

## 2022-02-28 DIAGNOSIS — I10 Essential (primary) hypertension: Secondary | ICD-10-CM | POA: Diagnosis not present

## 2022-02-28 DIAGNOSIS — Z7901 Long term (current) use of anticoagulants: Secondary | ICD-10-CM | POA: Diagnosis not present

## 2022-02-28 DIAGNOSIS — Z7982 Long term (current) use of aspirin: Secondary | ICD-10-CM | POA: Diagnosis not present

## 2022-02-28 DIAGNOSIS — Z955 Presence of coronary angioplasty implant and graft: Secondary | ICD-10-CM | POA: Diagnosis not present

## 2022-02-28 DIAGNOSIS — E559 Vitamin D deficiency, unspecified: Secondary | ICD-10-CM | POA: Diagnosis not present

## 2022-02-28 DIAGNOSIS — T8451XD Infection and inflammatory reaction due to internal right hip prosthesis, subsequent encounter: Secondary | ICD-10-CM | POA: Diagnosis not present

## 2022-02-28 DIAGNOSIS — E119 Type 2 diabetes mellitus without complications: Secondary | ICD-10-CM | POA: Diagnosis not present

## 2022-02-28 DIAGNOSIS — Z87891 Personal history of nicotine dependence: Secondary | ICD-10-CM | POA: Diagnosis not present

## 2022-02-28 DIAGNOSIS — I2510 Atherosclerotic heart disease of native coronary artery without angina pectoris: Secondary | ICD-10-CM | POA: Diagnosis not present

## 2022-03-01 ENCOUNTER — Other Ambulatory Visit: Payer: Self-pay | Admitting: Family

## 2022-03-02 DIAGNOSIS — A419 Sepsis, unspecified organism: Secondary | ICD-10-CM | POA: Diagnosis not present

## 2022-03-02 DIAGNOSIS — E7800 Pure hypercholesterolemia, unspecified: Secondary | ICD-10-CM | POA: Diagnosis not present

## 2022-03-02 DIAGNOSIS — M00851 Arthritis due to other bacteria, right hip: Secondary | ICD-10-CM | POA: Diagnosis not present

## 2022-03-02 DIAGNOSIS — I252 Old myocardial infarction: Secondary | ICD-10-CM | POA: Diagnosis not present

## 2022-03-02 DIAGNOSIS — Z452 Encounter for adjustment and management of vascular access device: Secondary | ICD-10-CM | POA: Diagnosis not present

## 2022-03-02 DIAGNOSIS — I2510 Atherosclerotic heart disease of native coronary artery without angina pectoris: Secondary | ICD-10-CM | POA: Diagnosis not present

## 2022-03-02 DIAGNOSIS — Z79891 Long term (current) use of opiate analgesic: Secondary | ICD-10-CM | POA: Diagnosis not present

## 2022-03-02 DIAGNOSIS — T8451XD Infection and inflammatory reaction due to internal right hip prosthesis, subsequent encounter: Secondary | ICD-10-CM | POA: Diagnosis not present

## 2022-03-02 DIAGNOSIS — Z7984 Long term (current) use of oral hypoglycemic drugs: Secondary | ICD-10-CM | POA: Diagnosis not present

## 2022-03-02 DIAGNOSIS — Z792 Long term (current) use of antibiotics: Secondary | ICD-10-CM | POA: Diagnosis not present

## 2022-03-02 DIAGNOSIS — Z87891 Personal history of nicotine dependence: Secondary | ICD-10-CM | POA: Diagnosis not present

## 2022-03-02 DIAGNOSIS — E559 Vitamin D deficiency, unspecified: Secondary | ICD-10-CM | POA: Diagnosis not present

## 2022-03-02 DIAGNOSIS — I10 Essential (primary) hypertension: Secondary | ICD-10-CM | POA: Diagnosis not present

## 2022-03-02 DIAGNOSIS — N390 Urinary tract infection, site not specified: Secondary | ICD-10-CM | POA: Diagnosis not present

## 2022-03-02 DIAGNOSIS — Z7901 Long term (current) use of anticoagulants: Secondary | ICD-10-CM | POA: Diagnosis not present

## 2022-03-02 DIAGNOSIS — K219 Gastro-esophageal reflux disease without esophagitis: Secondary | ICD-10-CM | POA: Diagnosis not present

## 2022-03-02 DIAGNOSIS — Z7982 Long term (current) use of aspirin: Secondary | ICD-10-CM | POA: Diagnosis not present

## 2022-03-02 DIAGNOSIS — N179 Acute kidney failure, unspecified: Secondary | ICD-10-CM | POA: Diagnosis not present

## 2022-03-02 DIAGNOSIS — Z955 Presence of coronary angioplasty implant and graft: Secondary | ICD-10-CM | POA: Diagnosis not present

## 2022-03-02 DIAGNOSIS — E119 Type 2 diabetes mellitus without complications: Secondary | ICD-10-CM | POA: Diagnosis not present

## 2022-03-03 DIAGNOSIS — I252 Old myocardial infarction: Secondary | ICD-10-CM | POA: Diagnosis not present

## 2022-03-03 DIAGNOSIS — E119 Type 2 diabetes mellitus without complications: Secondary | ICD-10-CM | POA: Diagnosis not present

## 2022-03-03 DIAGNOSIS — Z792 Long term (current) use of antibiotics: Secondary | ICD-10-CM | POA: Diagnosis not present

## 2022-03-03 DIAGNOSIS — I2510 Atherosclerotic heart disease of native coronary artery without angina pectoris: Secondary | ICD-10-CM | POA: Diagnosis not present

## 2022-03-03 DIAGNOSIS — Z7901 Long term (current) use of anticoagulants: Secondary | ICD-10-CM | POA: Diagnosis not present

## 2022-03-03 DIAGNOSIS — E559 Vitamin D deficiency, unspecified: Secondary | ICD-10-CM | POA: Diagnosis not present

## 2022-03-03 DIAGNOSIS — N179 Acute kidney failure, unspecified: Secondary | ICD-10-CM | POA: Diagnosis not present

## 2022-03-03 DIAGNOSIS — Z7984 Long term (current) use of oral hypoglycemic drugs: Secondary | ICD-10-CM | POA: Diagnosis not present

## 2022-03-03 DIAGNOSIS — M00851 Arthritis due to other bacteria, right hip: Secondary | ICD-10-CM | POA: Diagnosis not present

## 2022-03-03 DIAGNOSIS — N390 Urinary tract infection, site not specified: Secondary | ICD-10-CM | POA: Diagnosis not present

## 2022-03-03 DIAGNOSIS — Z87891 Personal history of nicotine dependence: Secondary | ICD-10-CM | POA: Diagnosis not present

## 2022-03-03 DIAGNOSIS — K219 Gastro-esophageal reflux disease without esophagitis: Secondary | ICD-10-CM | POA: Diagnosis not present

## 2022-03-03 DIAGNOSIS — Z452 Encounter for adjustment and management of vascular access device: Secondary | ICD-10-CM | POA: Diagnosis not present

## 2022-03-03 DIAGNOSIS — E7800 Pure hypercholesterolemia, unspecified: Secondary | ICD-10-CM | POA: Diagnosis not present

## 2022-03-03 DIAGNOSIS — Z7982 Long term (current) use of aspirin: Secondary | ICD-10-CM | POA: Diagnosis not present

## 2022-03-03 DIAGNOSIS — A419 Sepsis, unspecified organism: Secondary | ICD-10-CM | POA: Diagnosis not present

## 2022-03-03 DIAGNOSIS — T8451XD Infection and inflammatory reaction due to internal right hip prosthesis, subsequent encounter: Secondary | ICD-10-CM | POA: Diagnosis not present

## 2022-03-03 DIAGNOSIS — Z955 Presence of coronary angioplasty implant and graft: Secondary | ICD-10-CM | POA: Diagnosis not present

## 2022-03-03 DIAGNOSIS — Z79891 Long term (current) use of opiate analgesic: Secondary | ICD-10-CM | POA: Diagnosis not present

## 2022-03-03 DIAGNOSIS — I10 Essential (primary) hypertension: Secondary | ICD-10-CM | POA: Diagnosis not present

## 2022-03-04 DIAGNOSIS — Z7901 Long term (current) use of anticoagulants: Secondary | ICD-10-CM | POA: Diagnosis not present

## 2022-03-04 DIAGNOSIS — Z452 Encounter for adjustment and management of vascular access device: Secondary | ICD-10-CM | POA: Diagnosis not present

## 2022-03-04 DIAGNOSIS — E559 Vitamin D deficiency, unspecified: Secondary | ICD-10-CM | POA: Diagnosis not present

## 2022-03-04 DIAGNOSIS — N390 Urinary tract infection, site not specified: Secondary | ICD-10-CM | POA: Diagnosis not present

## 2022-03-04 DIAGNOSIS — Z7984 Long term (current) use of oral hypoglycemic drugs: Secondary | ICD-10-CM | POA: Diagnosis not present

## 2022-03-04 DIAGNOSIS — I10 Essential (primary) hypertension: Secondary | ICD-10-CM | POA: Diagnosis not present

## 2022-03-04 DIAGNOSIS — Z955 Presence of coronary angioplasty implant and graft: Secondary | ICD-10-CM | POA: Diagnosis not present

## 2022-03-04 DIAGNOSIS — E119 Type 2 diabetes mellitus without complications: Secondary | ICD-10-CM | POA: Diagnosis not present

## 2022-03-04 DIAGNOSIS — I252 Old myocardial infarction: Secondary | ICD-10-CM | POA: Diagnosis not present

## 2022-03-04 DIAGNOSIS — N179 Acute kidney failure, unspecified: Secondary | ICD-10-CM | POA: Diagnosis not present

## 2022-03-04 DIAGNOSIS — I2510 Atherosclerotic heart disease of native coronary artery without angina pectoris: Secondary | ICD-10-CM | POA: Diagnosis not present

## 2022-03-04 DIAGNOSIS — T8451XD Infection and inflammatory reaction due to internal right hip prosthesis, subsequent encounter: Secondary | ICD-10-CM | POA: Diagnosis not present

## 2022-03-04 DIAGNOSIS — K219 Gastro-esophageal reflux disease without esophagitis: Secondary | ICD-10-CM | POA: Diagnosis not present

## 2022-03-04 DIAGNOSIS — Z87891 Personal history of nicotine dependence: Secondary | ICD-10-CM | POA: Diagnosis not present

## 2022-03-04 DIAGNOSIS — Z79891 Long term (current) use of opiate analgesic: Secondary | ICD-10-CM | POA: Diagnosis not present

## 2022-03-04 DIAGNOSIS — Z792 Long term (current) use of antibiotics: Secondary | ICD-10-CM | POA: Diagnosis not present

## 2022-03-04 DIAGNOSIS — E7800 Pure hypercholesterolemia, unspecified: Secondary | ICD-10-CM | POA: Diagnosis not present

## 2022-03-04 DIAGNOSIS — Z7982 Long term (current) use of aspirin: Secondary | ICD-10-CM | POA: Diagnosis not present

## 2022-03-04 DIAGNOSIS — M25551 Pain in right hip: Secondary | ICD-10-CM | POA: Diagnosis not present

## 2022-03-04 DIAGNOSIS — A419 Sepsis, unspecified organism: Secondary | ICD-10-CM | POA: Diagnosis not present

## 2022-03-04 DIAGNOSIS — M00851 Arthritis due to other bacteria, right hip: Secondary | ICD-10-CM | POA: Diagnosis not present

## 2022-03-05 DIAGNOSIS — K219 Gastro-esophageal reflux disease without esophagitis: Secondary | ICD-10-CM | POA: Diagnosis not present

## 2022-03-05 DIAGNOSIS — N390 Urinary tract infection, site not specified: Secondary | ICD-10-CM | POA: Diagnosis not present

## 2022-03-05 DIAGNOSIS — I2510 Atherosclerotic heart disease of native coronary artery without angina pectoris: Secondary | ICD-10-CM | POA: Diagnosis not present

## 2022-03-05 DIAGNOSIS — Z452 Encounter for adjustment and management of vascular access device: Secondary | ICD-10-CM | POA: Diagnosis not present

## 2022-03-05 DIAGNOSIS — Z7982 Long term (current) use of aspirin: Secondary | ICD-10-CM | POA: Diagnosis not present

## 2022-03-05 DIAGNOSIS — N179 Acute kidney failure, unspecified: Secondary | ICD-10-CM | POA: Diagnosis not present

## 2022-03-05 DIAGNOSIS — Z7984 Long term (current) use of oral hypoglycemic drugs: Secondary | ICD-10-CM | POA: Diagnosis not present

## 2022-03-05 DIAGNOSIS — E119 Type 2 diabetes mellitus without complications: Secondary | ICD-10-CM | POA: Diagnosis not present

## 2022-03-05 DIAGNOSIS — Z79891 Long term (current) use of opiate analgesic: Secondary | ICD-10-CM | POA: Diagnosis not present

## 2022-03-05 DIAGNOSIS — T8451XD Infection and inflammatory reaction due to internal right hip prosthesis, subsequent encounter: Secondary | ICD-10-CM | POA: Diagnosis not present

## 2022-03-05 DIAGNOSIS — I252 Old myocardial infarction: Secondary | ICD-10-CM | POA: Diagnosis not present

## 2022-03-05 DIAGNOSIS — Z955 Presence of coronary angioplasty implant and graft: Secondary | ICD-10-CM | POA: Diagnosis not present

## 2022-03-05 DIAGNOSIS — I10 Essential (primary) hypertension: Secondary | ICD-10-CM | POA: Diagnosis not present

## 2022-03-05 DIAGNOSIS — Z87891 Personal history of nicotine dependence: Secondary | ICD-10-CM | POA: Diagnosis not present

## 2022-03-05 DIAGNOSIS — E559 Vitamin D deficiency, unspecified: Secondary | ICD-10-CM | POA: Diagnosis not present

## 2022-03-05 DIAGNOSIS — A419 Sepsis, unspecified organism: Secondary | ICD-10-CM | POA: Diagnosis not present

## 2022-03-05 DIAGNOSIS — Z7901 Long term (current) use of anticoagulants: Secondary | ICD-10-CM | POA: Diagnosis not present

## 2022-03-05 DIAGNOSIS — M00851 Arthritis due to other bacteria, right hip: Secondary | ICD-10-CM | POA: Diagnosis not present

## 2022-03-05 DIAGNOSIS — E7800 Pure hypercholesterolemia, unspecified: Secondary | ICD-10-CM | POA: Diagnosis not present

## 2022-03-05 DIAGNOSIS — Z792 Long term (current) use of antibiotics: Secondary | ICD-10-CM | POA: Diagnosis not present

## 2022-03-06 DIAGNOSIS — Z7409 Other reduced mobility: Secondary | ICD-10-CM | POA: Diagnosis not present

## 2022-03-06 DIAGNOSIS — I420 Dilated cardiomyopathy: Secondary | ICD-10-CM | POA: Diagnosis not present

## 2022-03-06 DIAGNOSIS — Z789 Other specified health status: Secondary | ICD-10-CM | POA: Diagnosis not present

## 2022-03-06 DIAGNOSIS — I2102 ST elevation (STEMI) myocardial infarction involving left anterior descending coronary artery: Secondary | ICD-10-CM | POA: Diagnosis not present

## 2022-03-07 DIAGNOSIS — Z79891 Long term (current) use of opiate analgesic: Secondary | ICD-10-CM | POA: Diagnosis not present

## 2022-03-07 DIAGNOSIS — Z7982 Long term (current) use of aspirin: Secondary | ICD-10-CM | POA: Diagnosis not present

## 2022-03-07 DIAGNOSIS — N390 Urinary tract infection, site not specified: Secondary | ICD-10-CM | POA: Diagnosis not present

## 2022-03-07 DIAGNOSIS — I10 Essential (primary) hypertension: Secondary | ICD-10-CM | POA: Diagnosis not present

## 2022-03-07 DIAGNOSIS — E119 Type 2 diabetes mellitus without complications: Secondary | ICD-10-CM | POA: Diagnosis not present

## 2022-03-07 DIAGNOSIS — E7800 Pure hypercholesterolemia, unspecified: Secondary | ICD-10-CM | POA: Diagnosis not present

## 2022-03-07 DIAGNOSIS — A419 Sepsis, unspecified organism: Secondary | ICD-10-CM | POA: Diagnosis not present

## 2022-03-07 DIAGNOSIS — N179 Acute kidney failure, unspecified: Secondary | ICD-10-CM | POA: Diagnosis not present

## 2022-03-07 DIAGNOSIS — Z87891 Personal history of nicotine dependence: Secondary | ICD-10-CM | POA: Diagnosis not present

## 2022-03-07 DIAGNOSIS — I2510 Atherosclerotic heart disease of native coronary artery without angina pectoris: Secondary | ICD-10-CM | POA: Diagnosis not present

## 2022-03-07 DIAGNOSIS — E559 Vitamin D deficiency, unspecified: Secondary | ICD-10-CM | POA: Diagnosis not present

## 2022-03-07 DIAGNOSIS — T8451XD Infection and inflammatory reaction due to internal right hip prosthesis, subsequent encounter: Secondary | ICD-10-CM | POA: Diagnosis not present

## 2022-03-07 DIAGNOSIS — K219 Gastro-esophageal reflux disease without esophagitis: Secondary | ICD-10-CM | POA: Diagnosis not present

## 2022-03-07 DIAGNOSIS — Z955 Presence of coronary angioplasty implant and graft: Secondary | ICD-10-CM | POA: Diagnosis not present

## 2022-03-07 DIAGNOSIS — Z7901 Long term (current) use of anticoagulants: Secondary | ICD-10-CM | POA: Diagnosis not present

## 2022-03-07 DIAGNOSIS — Z7984 Long term (current) use of oral hypoglycemic drugs: Secondary | ICD-10-CM | POA: Diagnosis not present

## 2022-03-07 DIAGNOSIS — Z792 Long term (current) use of antibiotics: Secondary | ICD-10-CM | POA: Diagnosis not present

## 2022-03-07 DIAGNOSIS — Z452 Encounter for adjustment and management of vascular access device: Secondary | ICD-10-CM | POA: Diagnosis not present

## 2022-03-07 DIAGNOSIS — M00851 Arthritis due to other bacteria, right hip: Secondary | ICD-10-CM | POA: Diagnosis not present

## 2022-03-07 DIAGNOSIS — I252 Old myocardial infarction: Secondary | ICD-10-CM | POA: Diagnosis not present

## 2022-03-09 DIAGNOSIS — K219 Gastro-esophageal reflux disease without esophagitis: Secondary | ICD-10-CM | POA: Diagnosis not present

## 2022-03-09 DIAGNOSIS — E559 Vitamin D deficiency, unspecified: Secondary | ICD-10-CM | POA: Diagnosis not present

## 2022-03-09 DIAGNOSIS — A419 Sepsis, unspecified organism: Secondary | ICD-10-CM | POA: Diagnosis not present

## 2022-03-09 DIAGNOSIS — N390 Urinary tract infection, site not specified: Secondary | ICD-10-CM | POA: Diagnosis not present

## 2022-03-09 DIAGNOSIS — I2510 Atherosclerotic heart disease of native coronary artery without angina pectoris: Secondary | ICD-10-CM | POA: Diagnosis not present

## 2022-03-09 DIAGNOSIS — Z7984 Long term (current) use of oral hypoglycemic drugs: Secondary | ICD-10-CM | POA: Diagnosis not present

## 2022-03-09 DIAGNOSIS — N179 Acute kidney failure, unspecified: Secondary | ICD-10-CM | POA: Diagnosis not present

## 2022-03-09 DIAGNOSIS — Z452 Encounter for adjustment and management of vascular access device: Secondary | ICD-10-CM | POA: Diagnosis not present

## 2022-03-09 DIAGNOSIS — E7800 Pure hypercholesterolemia, unspecified: Secondary | ICD-10-CM | POA: Diagnosis not present

## 2022-03-09 DIAGNOSIS — I252 Old myocardial infarction: Secondary | ICD-10-CM | POA: Diagnosis not present

## 2022-03-09 DIAGNOSIS — Z7901 Long term (current) use of anticoagulants: Secondary | ICD-10-CM | POA: Diagnosis not present

## 2022-03-09 DIAGNOSIS — Z955 Presence of coronary angioplasty implant and graft: Secondary | ICD-10-CM | POA: Diagnosis not present

## 2022-03-09 DIAGNOSIS — Z79891 Long term (current) use of opiate analgesic: Secondary | ICD-10-CM | POA: Diagnosis not present

## 2022-03-09 DIAGNOSIS — Z7982 Long term (current) use of aspirin: Secondary | ICD-10-CM | POA: Diagnosis not present

## 2022-03-09 DIAGNOSIS — M00851 Arthritis due to other bacteria, right hip: Secondary | ICD-10-CM | POA: Diagnosis not present

## 2022-03-09 DIAGNOSIS — I10 Essential (primary) hypertension: Secondary | ICD-10-CM | POA: Diagnosis not present

## 2022-03-09 DIAGNOSIS — T8451XD Infection and inflammatory reaction due to internal right hip prosthesis, subsequent encounter: Secondary | ICD-10-CM | POA: Diagnosis not present

## 2022-03-09 DIAGNOSIS — Z792 Long term (current) use of antibiotics: Secondary | ICD-10-CM | POA: Diagnosis not present

## 2022-03-09 DIAGNOSIS — Z87891 Personal history of nicotine dependence: Secondary | ICD-10-CM | POA: Diagnosis not present

## 2022-03-09 DIAGNOSIS — E119 Type 2 diabetes mellitus without complications: Secondary | ICD-10-CM | POA: Diagnosis not present

## 2022-03-10 DIAGNOSIS — T8451XD Infection and inflammatory reaction due to internal right hip prosthesis, subsequent encounter: Secondary | ICD-10-CM | POA: Diagnosis not present

## 2022-03-10 DIAGNOSIS — I252 Old myocardial infarction: Secondary | ICD-10-CM | POA: Diagnosis not present

## 2022-03-10 DIAGNOSIS — I2510 Atherosclerotic heart disease of native coronary artery without angina pectoris: Secondary | ICD-10-CM | POA: Diagnosis not present

## 2022-03-10 DIAGNOSIS — E7800 Pure hypercholesterolemia, unspecified: Secondary | ICD-10-CM | POA: Diagnosis not present

## 2022-03-10 DIAGNOSIS — I10 Essential (primary) hypertension: Secondary | ICD-10-CM | POA: Diagnosis not present

## 2022-03-10 DIAGNOSIS — Z452 Encounter for adjustment and management of vascular access device: Secondary | ICD-10-CM | POA: Diagnosis not present

## 2022-03-10 DIAGNOSIS — A419 Sepsis, unspecified organism: Secondary | ICD-10-CM | POA: Diagnosis not present

## 2022-03-10 DIAGNOSIS — Z792 Long term (current) use of antibiotics: Secondary | ICD-10-CM | POA: Diagnosis not present

## 2022-03-10 DIAGNOSIS — Z7982 Long term (current) use of aspirin: Secondary | ICD-10-CM | POA: Diagnosis not present

## 2022-03-10 DIAGNOSIS — N179 Acute kidney failure, unspecified: Secondary | ICD-10-CM | POA: Diagnosis not present

## 2022-03-10 DIAGNOSIS — Z955 Presence of coronary angioplasty implant and graft: Secondary | ICD-10-CM | POA: Diagnosis not present

## 2022-03-10 DIAGNOSIS — N390 Urinary tract infection, site not specified: Secondary | ICD-10-CM | POA: Diagnosis not present

## 2022-03-10 DIAGNOSIS — Z87891 Personal history of nicotine dependence: Secondary | ICD-10-CM | POA: Diagnosis not present

## 2022-03-10 DIAGNOSIS — E119 Type 2 diabetes mellitus without complications: Secondary | ICD-10-CM | POA: Diagnosis not present

## 2022-03-10 DIAGNOSIS — K219 Gastro-esophageal reflux disease without esophagitis: Secondary | ICD-10-CM | POA: Diagnosis not present

## 2022-03-10 DIAGNOSIS — M00851 Arthritis due to other bacteria, right hip: Secondary | ICD-10-CM | POA: Diagnosis not present

## 2022-03-10 DIAGNOSIS — E559 Vitamin D deficiency, unspecified: Secondary | ICD-10-CM | POA: Diagnosis not present

## 2022-03-10 DIAGNOSIS — Z7984 Long term (current) use of oral hypoglycemic drugs: Secondary | ICD-10-CM | POA: Diagnosis not present

## 2022-03-10 DIAGNOSIS — Z79891 Long term (current) use of opiate analgesic: Secondary | ICD-10-CM | POA: Diagnosis not present

## 2022-03-10 DIAGNOSIS — Z7901 Long term (current) use of anticoagulants: Secondary | ICD-10-CM | POA: Diagnosis not present

## 2022-03-11 DIAGNOSIS — I252 Old myocardial infarction: Secondary | ICD-10-CM | POA: Diagnosis not present

## 2022-03-11 DIAGNOSIS — Z87891 Personal history of nicotine dependence: Secondary | ICD-10-CM | POA: Diagnosis not present

## 2022-03-11 DIAGNOSIS — Z7901 Long term (current) use of anticoagulants: Secondary | ICD-10-CM | POA: Diagnosis not present

## 2022-03-11 DIAGNOSIS — K219 Gastro-esophageal reflux disease without esophagitis: Secondary | ICD-10-CM | POA: Diagnosis not present

## 2022-03-11 DIAGNOSIS — I10 Essential (primary) hypertension: Secondary | ICD-10-CM | POA: Diagnosis not present

## 2022-03-11 DIAGNOSIS — Z955 Presence of coronary angioplasty implant and graft: Secondary | ICD-10-CM | POA: Diagnosis not present

## 2022-03-11 DIAGNOSIS — M00851 Arthritis due to other bacteria, right hip: Secondary | ICD-10-CM | POA: Diagnosis not present

## 2022-03-11 DIAGNOSIS — A419 Sepsis, unspecified organism: Secondary | ICD-10-CM | POA: Diagnosis not present

## 2022-03-11 DIAGNOSIS — E119 Type 2 diabetes mellitus without complications: Secondary | ICD-10-CM | POA: Diagnosis not present

## 2022-03-11 DIAGNOSIS — E559 Vitamin D deficiency, unspecified: Secondary | ICD-10-CM | POA: Diagnosis not present

## 2022-03-11 DIAGNOSIS — Z452 Encounter for adjustment and management of vascular access device: Secondary | ICD-10-CM | POA: Diagnosis not present

## 2022-03-11 DIAGNOSIS — N179 Acute kidney failure, unspecified: Secondary | ICD-10-CM | POA: Diagnosis not present

## 2022-03-11 DIAGNOSIS — Z79891 Long term (current) use of opiate analgesic: Secondary | ICD-10-CM | POA: Diagnosis not present

## 2022-03-11 DIAGNOSIS — I2510 Atherosclerotic heart disease of native coronary artery without angina pectoris: Secondary | ICD-10-CM | POA: Diagnosis not present

## 2022-03-11 DIAGNOSIS — E7800 Pure hypercholesterolemia, unspecified: Secondary | ICD-10-CM | POA: Diagnosis not present

## 2022-03-11 DIAGNOSIS — Z792 Long term (current) use of antibiotics: Secondary | ICD-10-CM | POA: Diagnosis not present

## 2022-03-11 DIAGNOSIS — Z7984 Long term (current) use of oral hypoglycemic drugs: Secondary | ICD-10-CM | POA: Diagnosis not present

## 2022-03-11 DIAGNOSIS — Z7982 Long term (current) use of aspirin: Secondary | ICD-10-CM | POA: Diagnosis not present

## 2022-03-11 DIAGNOSIS — T8451XD Infection and inflammatory reaction due to internal right hip prosthesis, subsequent encounter: Secondary | ICD-10-CM | POA: Diagnosis not present

## 2022-03-11 DIAGNOSIS — N390 Urinary tract infection, site not specified: Secondary | ICD-10-CM | POA: Diagnosis not present

## 2022-03-13 DIAGNOSIS — Z789 Other specified health status: Secondary | ICD-10-CM | POA: Diagnosis not present

## 2022-03-13 DIAGNOSIS — Z7409 Other reduced mobility: Secondary | ICD-10-CM | POA: Diagnosis not present

## 2022-03-14 DIAGNOSIS — N390 Urinary tract infection, site not specified: Secondary | ICD-10-CM | POA: Diagnosis not present

## 2022-03-14 DIAGNOSIS — A419 Sepsis, unspecified organism: Secondary | ICD-10-CM | POA: Diagnosis not present

## 2022-03-14 DIAGNOSIS — E7800 Pure hypercholesterolemia, unspecified: Secondary | ICD-10-CM | POA: Diagnosis not present

## 2022-03-14 DIAGNOSIS — Z7901 Long term (current) use of anticoagulants: Secondary | ICD-10-CM | POA: Diagnosis not present

## 2022-03-14 DIAGNOSIS — Z7984 Long term (current) use of oral hypoglycemic drugs: Secondary | ICD-10-CM | POA: Diagnosis not present

## 2022-03-14 DIAGNOSIS — Z955 Presence of coronary angioplasty implant and graft: Secondary | ICD-10-CM | POA: Diagnosis not present

## 2022-03-14 DIAGNOSIS — Z792 Long term (current) use of antibiotics: Secondary | ICD-10-CM | POA: Diagnosis not present

## 2022-03-14 DIAGNOSIS — Z87891 Personal history of nicotine dependence: Secondary | ICD-10-CM | POA: Diagnosis not present

## 2022-03-14 DIAGNOSIS — Z7982 Long term (current) use of aspirin: Secondary | ICD-10-CM | POA: Diagnosis not present

## 2022-03-14 DIAGNOSIS — N179 Acute kidney failure, unspecified: Secondary | ICD-10-CM | POA: Diagnosis not present

## 2022-03-14 DIAGNOSIS — I10 Essential (primary) hypertension: Secondary | ICD-10-CM | POA: Diagnosis not present

## 2022-03-14 DIAGNOSIS — T8451XD Infection and inflammatory reaction due to internal right hip prosthesis, subsequent encounter: Secondary | ICD-10-CM | POA: Diagnosis not present

## 2022-03-14 DIAGNOSIS — K219 Gastro-esophageal reflux disease without esophagitis: Secondary | ICD-10-CM | POA: Diagnosis not present

## 2022-03-14 DIAGNOSIS — I252 Old myocardial infarction: Secondary | ICD-10-CM | POA: Diagnosis not present

## 2022-03-14 DIAGNOSIS — E559 Vitamin D deficiency, unspecified: Secondary | ICD-10-CM | POA: Diagnosis not present

## 2022-03-14 DIAGNOSIS — M00851 Arthritis due to other bacteria, right hip: Secondary | ICD-10-CM | POA: Diagnosis not present

## 2022-03-14 DIAGNOSIS — I2510 Atherosclerotic heart disease of native coronary artery without angina pectoris: Secondary | ICD-10-CM | POA: Diagnosis not present

## 2022-03-14 DIAGNOSIS — Z79891 Long term (current) use of opiate analgesic: Secondary | ICD-10-CM | POA: Diagnosis not present

## 2022-03-14 DIAGNOSIS — Z452 Encounter for adjustment and management of vascular access device: Secondary | ICD-10-CM | POA: Diagnosis not present

## 2022-03-14 DIAGNOSIS — E119 Type 2 diabetes mellitus without complications: Secondary | ICD-10-CM | POA: Diagnosis not present

## 2022-03-17 DIAGNOSIS — Z792 Long term (current) use of antibiotics: Secondary | ICD-10-CM | POA: Diagnosis not present

## 2022-03-17 DIAGNOSIS — M00851 Arthritis due to other bacteria, right hip: Secondary | ICD-10-CM | POA: Diagnosis not present

## 2022-03-17 DIAGNOSIS — R7881 Bacteremia: Secondary | ICD-10-CM | POA: Diagnosis not present

## 2022-03-18 DIAGNOSIS — E7800 Pure hypercholesterolemia, unspecified: Secondary | ICD-10-CM | POA: Diagnosis not present

## 2022-03-18 DIAGNOSIS — Z79891 Long term (current) use of opiate analgesic: Secondary | ICD-10-CM | POA: Diagnosis not present

## 2022-03-18 DIAGNOSIS — E119 Type 2 diabetes mellitus without complications: Secondary | ICD-10-CM | POA: Diagnosis not present

## 2022-03-18 DIAGNOSIS — Z7982 Long term (current) use of aspirin: Secondary | ICD-10-CM | POA: Diagnosis not present

## 2022-03-18 DIAGNOSIS — A419 Sepsis, unspecified organism: Secondary | ICD-10-CM | POA: Diagnosis not present

## 2022-03-18 DIAGNOSIS — I252 Old myocardial infarction: Secondary | ICD-10-CM | POA: Diagnosis not present

## 2022-03-18 DIAGNOSIS — N179 Acute kidney failure, unspecified: Secondary | ICD-10-CM | POA: Diagnosis not present

## 2022-03-18 DIAGNOSIS — E559 Vitamin D deficiency, unspecified: Secondary | ICD-10-CM | POA: Diagnosis not present

## 2022-03-18 DIAGNOSIS — K219 Gastro-esophageal reflux disease without esophagitis: Secondary | ICD-10-CM | POA: Diagnosis not present

## 2022-03-18 DIAGNOSIS — M00851 Arthritis due to other bacteria, right hip: Secondary | ICD-10-CM | POA: Diagnosis not present

## 2022-03-18 DIAGNOSIS — I2510 Atherosclerotic heart disease of native coronary artery without angina pectoris: Secondary | ICD-10-CM | POA: Diagnosis not present

## 2022-03-18 DIAGNOSIS — N390 Urinary tract infection, site not specified: Secondary | ICD-10-CM | POA: Diagnosis not present

## 2022-03-18 DIAGNOSIS — T8451XD Infection and inflammatory reaction due to internal right hip prosthesis, subsequent encounter: Secondary | ICD-10-CM | POA: Diagnosis not present

## 2022-03-18 DIAGNOSIS — Z792 Long term (current) use of antibiotics: Secondary | ICD-10-CM | POA: Diagnosis not present

## 2022-03-18 DIAGNOSIS — Z452 Encounter for adjustment and management of vascular access device: Secondary | ICD-10-CM | POA: Diagnosis not present

## 2022-03-18 DIAGNOSIS — Z955 Presence of coronary angioplasty implant and graft: Secondary | ICD-10-CM | POA: Diagnosis not present

## 2022-03-18 DIAGNOSIS — Z87891 Personal history of nicotine dependence: Secondary | ICD-10-CM | POA: Diagnosis not present

## 2022-03-18 DIAGNOSIS — Z7984 Long term (current) use of oral hypoglycemic drugs: Secondary | ICD-10-CM | POA: Diagnosis not present

## 2022-03-18 DIAGNOSIS — Z7901 Long term (current) use of anticoagulants: Secondary | ICD-10-CM | POA: Diagnosis not present

## 2022-03-18 DIAGNOSIS — I10 Essential (primary) hypertension: Secondary | ICD-10-CM | POA: Diagnosis not present

## 2022-03-20 DIAGNOSIS — Z7409 Other reduced mobility: Secondary | ICD-10-CM | POA: Diagnosis not present

## 2022-03-20 DIAGNOSIS — Z789 Other specified health status: Secondary | ICD-10-CM | POA: Diagnosis not present

## 2022-03-22 ENCOUNTER — Encounter: Payer: Self-pay | Admitting: Family

## 2022-03-22 ENCOUNTER — Ambulatory Visit (INDEPENDENT_AMBULATORY_CARE_PROVIDER_SITE_OTHER): Payer: Medicare Other | Admitting: Family

## 2022-03-22 VITALS — BP 102/59 | HR 83 | Temp 98.4°F | Ht 73.0 in | Wt 181.0 lb

## 2022-03-22 DIAGNOSIS — E559 Vitamin D deficiency, unspecified: Secondary | ICD-10-CM | POA: Diagnosis not present

## 2022-03-22 DIAGNOSIS — Z79891 Long term (current) use of opiate analgesic: Secondary | ICD-10-CM | POA: Diagnosis not present

## 2022-03-22 DIAGNOSIS — E039 Hypothyroidism, unspecified: Secondary | ICD-10-CM | POA: Diagnosis not present

## 2022-03-22 DIAGNOSIS — I2510 Atherosclerotic heart disease of native coronary artery without angina pectoris: Secondary | ICD-10-CM | POA: Diagnosis not present

## 2022-03-22 DIAGNOSIS — N1831 Chronic kidney disease, stage 3a: Secondary | ICD-10-CM | POA: Diagnosis not present

## 2022-03-22 DIAGNOSIS — Z87891 Personal history of nicotine dependence: Secondary | ICD-10-CM | POA: Diagnosis not present

## 2022-03-22 DIAGNOSIS — N179 Acute kidney failure, unspecified: Secondary | ICD-10-CM | POA: Diagnosis not present

## 2022-03-22 DIAGNOSIS — Z7982 Long term (current) use of aspirin: Secondary | ICD-10-CM | POA: Diagnosis not present

## 2022-03-22 DIAGNOSIS — K219 Gastro-esophageal reflux disease without esophagitis: Secondary | ICD-10-CM

## 2022-03-22 DIAGNOSIS — R351 Nocturia: Secondary | ICD-10-CM

## 2022-03-22 DIAGNOSIS — E119 Type 2 diabetes mellitus without complications: Secondary | ICD-10-CM | POA: Diagnosis not present

## 2022-03-22 DIAGNOSIS — Z955 Presence of coronary angioplasty implant and graft: Secondary | ICD-10-CM | POA: Diagnosis not present

## 2022-03-22 DIAGNOSIS — Z8546 Personal history of malignant neoplasm of prostate: Secondary | ICD-10-CM

## 2022-03-22 DIAGNOSIS — Z7984 Long term (current) use of oral hypoglycemic drugs: Secondary | ICD-10-CM | POA: Diagnosis not present

## 2022-03-22 DIAGNOSIS — E7800 Pure hypercholesterolemia, unspecified: Secondary | ICD-10-CM | POA: Diagnosis not present

## 2022-03-22 DIAGNOSIS — Z8739 Personal history of other diseases of the musculoskeletal system and connective tissue: Secondary | ICD-10-CM

## 2022-03-22 DIAGNOSIS — M00851 Arthritis due to other bacteria, right hip: Secondary | ICD-10-CM | POA: Diagnosis not present

## 2022-03-22 DIAGNOSIS — E782 Mixed hyperlipidemia: Secondary | ICD-10-CM | POA: Diagnosis not present

## 2022-03-22 DIAGNOSIS — A419 Sepsis, unspecified organism: Secondary | ICD-10-CM | POA: Diagnosis not present

## 2022-03-22 DIAGNOSIS — N401 Enlarged prostate with lower urinary tract symptoms: Secondary | ICD-10-CM

## 2022-03-22 DIAGNOSIS — T8451XA Infection and inflammatory reaction due to internal right hip prosthesis, initial encounter: Secondary | ICD-10-CM | POA: Diagnosis not present

## 2022-03-22 DIAGNOSIS — Z792 Long term (current) use of antibiotics: Secondary | ICD-10-CM | POA: Diagnosis not present

## 2022-03-22 DIAGNOSIS — Z452 Encounter for adjustment and management of vascular access device: Secondary | ICD-10-CM | POA: Diagnosis not present

## 2022-03-22 DIAGNOSIS — N390 Urinary tract infection, site not specified: Secondary | ICD-10-CM | POA: Diagnosis not present

## 2022-03-22 DIAGNOSIS — T8451XD Infection and inflammatory reaction due to internal right hip prosthesis, subsequent encounter: Secondary | ICD-10-CM | POA: Diagnosis not present

## 2022-03-22 DIAGNOSIS — I10 Essential (primary) hypertension: Secondary | ICD-10-CM

## 2022-03-22 DIAGNOSIS — I252 Old myocardial infarction: Secondary | ICD-10-CM | POA: Diagnosis not present

## 2022-03-22 DIAGNOSIS — Z7901 Long term (current) use of anticoagulants: Secondary | ICD-10-CM | POA: Diagnosis not present

## 2022-03-22 NOTE — Progress Notes (Signed)
Subjective:    Patient ID: Paul Murillo, male    DOB: June 09, 1956, 66 y.o.   MRN: [id number]  Chief Complaint  Patient presents with   Medical Management of Chronic Issues   Hypertension   Pt presents to the office today for chronic follow up . Pt is followed by Urologists every 6 months for Prostate Cancer. PT states he is followed by Cardiologists annually for CAD. He states he is doing well and retired 05/08/20.  He was hospitalized with septic arthritis. He has a PICC line and getting IV antibiotics. Followed by Ortho.    He has CKD and limits NSAID's/  Hypertension This is a chronic problem. The current episode started more than 1 year ago. The problem has been resolved since onset. The problem is controlled. Associated symptoms include malaise/fatigue. Pertinent negatives include no peripheral edema or shortness of breath. Risk factors for coronary artery disease include dyslipidemia, male gender and sedentary lifestyle. The current treatment provides moderate improvement.  Gastroesophageal Reflux He complains of belching and heartburn. This is a chronic problem. The current episode started more than 1 year ago. The problem occurs occasionally. He has tried a PPI for the symptoms. The treatment provided moderate relief.  Benign Prostatic Hypertrophy This is a chronic problem. The current episode started more than 1 year ago. Irritative symptoms include nocturia (1). Past treatments include tamsulosin. The treatment provided moderate relief.  Hyperlipidemia This is a chronic problem. The current episode started more than 1 year ago. The problem is controlled. Recent lipid tests were reviewed and are normal. He has no history of obesity. Pertinent negatives include no shortness of breath. Current antihyperlipidemic treatment includes statins. The current treatment provides moderate improvement of lipids. Risk factors for coronary artery disease include dyslipidemia, male sex, hypertension  and a sedentary lifestyle.     Review of Systems  Constitutional:  Positive for malaise/fatigue.  Respiratory:  Negative for shortness of breath.   Gastrointestinal:  Positive for heartburn.  Genitourinary:  Positive for nocturia (1).  All other systems reviewed and are negative.     Objective:   Physical Exam Vitals reviewed.  Constitutional:      General: He is not in acute distress.    Appearance: He is well-developed.  HENT:     Head: Normocephalic.     Right Ear: Tympanic membrane normal.     Left Ear: Tympanic membrane normal.  Eyes:     General:        Right eye: No discharge.        Left eye: No discharge.     Pupils: Pupils are equal, round, and reactive to light.  Neck:     Thyroid: No thyromegaly.  Cardiovascular:     Rate and Rhythm: Normal rate and regular rhythm.     Heart sounds: Normal heart sounds. No murmur heard. Pulmonary:     Effort: Pulmonary effort is normal. No respiratory distress.     Breath sounds: Normal breath sounds. No wheezing.  Abdominal:     General: Bowel sounds are normal. There is no distension.     Palpations: Abdomen is soft.     Tenderness: There is no abdominal tenderness.  Musculoskeletal:        General: No tenderness.     Cervical back: Normal range of motion and neck supple.     Comments: Pain in right hip with internal and external rotation  Skin:    General: Skin is warm and dry.  Findings: No erythema or rash.  Neurological:     Mental Status: He is alert and oriented to person, place, and time.     Cranial Nerves: No cranial nerve deficit.     Deep Tendon Reflexes: Reflexes are normal and symmetric.  Psychiatric:        Behavior: Behavior normal.        Thought Content: Thought content normal.        Judgment: Judgment normal.      BP (!) 102/59   Pulse 83   Temp 98.4 F (36.9 C) (Temporal)   Ht '6\' 1"'$  (1.854 m)   Wt 181 lb (82.1 kg)   SpO2 100%   BMI 23.88 kg/m      Assessment & Plan:  Paul Murillo comes in today with chief complaint of Medical Management of Chronic Issues and Hypertension   Diagnosis and orders addressed:  1. Primary hypertension  2. Hypothyroidism, unspecified type  3. Gastroesophageal reflux disease, unspecified whether esophagitis present  4. Coronary artery disease involving native coronary artery of native heart without angina pectoris  5. Stage 3a chronic kidney disease ([place])  6. Benign prostatic hyperplasia with nocturia  7. Mixed hyperlipidemia   8. H/O malignant neoplasm of prostate  9. H/O septic arthritis    Labs reviewed, getting labs drawn from [facility] weekly Keep follow up with Ortho Health Maintenance reviewed Diet and exercise encouraged  Follow up plan: 6 months    [name], FNP

## 2022-03-22 NOTE — Patient Instructions (Signed)
Septic Arthritis Septic arthritis is inflammation of a joint that results from an infection. The infection occurs when bacteria or other germs get inside a joint. The knee and hip joints are most often affected, but other joints may also become infected. Usually, just one joint is affected. Joint infections need to be treated quickly to prevent damage to the joint, and to prevent the infection from spreading to other areas of the body. What are the causes? This condition is most often caused by Staphylococcus bacteria. Other causes may include: Fungal infections. Sexually transmitted infections (STIs). Tuberculosis. Bacteria or other germs can spread to the joint. These bacteria are usually from: Blood carrying germs from an infection in another part of your body to your joint. This is the most common cause of septic arthritis. An open wound near the joint. A needle put into the joint. Joint surgery. An infection in the bone (osteomyelitis) that spreads to the joint. What increases the risk? You may have a higher risk for this condition if you: Have an artificial joint. Have a blood or skin infection. Have open sores or wounds on your skin. Had a recent joint surgery or procedure. Had a recent joint injury. Have a long-term (chronic) disease, such as: Diabetes. Osteoarthritis. Rheumatoid arthritis. Human immunodeficiency virus (HIV). Have a condition or take medicines that weaken your body's defense system (immune system). Use IV medicines or have a central line for IV access. Have gonorrhea. What are the signs or symptoms? Symptoms of this condition include: Swelling at the joint. Severe pain in the joint. Redness and warmth in the joint. Being unable to move the joint. Fever and chills. How is this diagnosed? This condition may be diagnosed based on: Your symptoms. Your medical history. A physical exam. Tests to confirm the diagnosis. These may include: Removing fluid from  your joint to look for signs of infection (synovial fluid analysis). Blood tests. Imaging studies. These may include: X-rays. MRI. CT scan. Ultrasound. How is this treated? This condition may be treated by: Draining fluid from your joint. This may be done for several days in order to relieve pain. Taking antibiotic medicine. This may be given by IV or by mouth. It may be done in a hospital at first. You may have to continue antibiotics at home by IV or by mouth for several weeks after that. Surgery to remove: Infected fluid and tissue from the joint. An infected artificial joint. After the infection has started to heal, you may need physical therapy to regain movement and strength in the joint. Follow these instructions at home: Medicines  Take over-the-counter and prescription medicines only as told by your health care provider. If you were prescribed an antibiotic medicine, take it as told by your health care provider. Do not stop taking the antibiotic even if you start to feel better. If you need to take antibiotics at home by IV, follow instructions from your health care provider about how to do this. You may need to have a nurse come to your home to give you antibiotics through IV. Managing pain, stiffness, and swelling  If directed, put ice on the affected area. To do this: Put ice in a plastic bag. Place a towel between your skin and the bag. Leave the ice on for 20 minutes, 2-3 times a day. Remove the ice if your skin turns bright red. This is very important. If you cannot feel pain, heat, or cold, you have a greater risk of damage to the area. Raise (  elevate) the affected area above the level of your heart while you are sitting or lying down. Activity Return to your normal activities as told by your health care provider. Ask your health care provider what activities are safe for you. Do any exercises or stretches as told by your health care provider or physical  therapist. General instructions Do not use any products that contain nicotine or tobacco. These products include cigarettes, chewing tobacco, and vaping devices, such as e-cigarettes. These can delay healing. If you need help quitting, ask your health care provider. Wash your hands often with soap and water for at least 20 seconds. If soap and water are not available, use hand sanitizer. Keep all follow-up visits. This is important. Contact a health care provider if: You have pain that is not controlled with medicine. You develop a fever or chills. You have redness, warmth, pain, or swelling that returns after treatment. Get help right away if: You have signs of worsening infection in your joint. Watch for: Very severe pain. Redness. Warmth. Swelling. You have rapid breathing or you have trouble breathing. You have chest pain. You cannot drink fluids or make urine. You notice that the affected area changed color or turned blue. You have numbness or severe pain in the affected area. These symptoms may be an emergency. Get help right away. Call 911. Do not wait to see if the symptoms will go away. Do not drive yourself to the hospital. Summary Septic arthritis is inflammation of a joint that occurs when bacteria or other germs get inside a joint and cause an infection. Joint infections need to be treated quickly to prevent damage to the joint, and to prevent the infection from spreading to other areas of the body. Symptoms of joint infection include redness, warmth, swelling, pain, and being unable to move a joint. Treatment usually involves draining fluid from the joint and taking antibiotic medicine. This information is not intended to replace advice given to you by your health care provider. Make sure you discuss any questions you have with your health care provider. Document Revised: 09/01/2021 Document Reviewed: 09/01/2021 Elsevier Patient Education  [place].

## 2022-03-24 DIAGNOSIS — T8451XD Infection and inflammatory reaction due to internal right hip prosthesis, subsequent encounter: Secondary | ICD-10-CM | POA: Diagnosis not present

## 2022-03-24 DIAGNOSIS — Z96641 Presence of right artificial hip joint: Secondary | ICD-10-CM | POA: Diagnosis not present

## 2022-03-24 DIAGNOSIS — Z792 Long term (current) use of antibiotics: Secondary | ICD-10-CM | POA: Diagnosis not present

## 2022-03-25 DIAGNOSIS — N390 Urinary tract infection, site not specified: Secondary | ICD-10-CM | POA: Diagnosis not present

## 2022-03-25 DIAGNOSIS — Z955 Presence of coronary angioplasty implant and graft: Secondary | ICD-10-CM | POA: Diagnosis not present

## 2022-03-25 DIAGNOSIS — Z7984 Long term (current) use of oral hypoglycemic drugs: Secondary | ICD-10-CM | POA: Diagnosis not present

## 2022-03-25 DIAGNOSIS — Z87891 Personal history of nicotine dependence: Secondary | ICD-10-CM | POA: Diagnosis not present

## 2022-03-25 DIAGNOSIS — Z792 Long term (current) use of antibiotics: Secondary | ICD-10-CM | POA: Diagnosis not present

## 2022-03-25 DIAGNOSIS — E559 Vitamin D deficiency, unspecified: Secondary | ICD-10-CM | POA: Diagnosis not present

## 2022-03-25 DIAGNOSIS — K219 Gastro-esophageal reflux disease without esophagitis: Secondary | ICD-10-CM | POA: Diagnosis not present

## 2022-03-25 DIAGNOSIS — Z7901 Long term (current) use of anticoagulants: Secondary | ICD-10-CM | POA: Diagnosis not present

## 2022-03-25 DIAGNOSIS — N179 Acute kidney failure, unspecified: Secondary | ICD-10-CM | POA: Diagnosis not present

## 2022-03-25 DIAGNOSIS — E7800 Pure hypercholesterolemia, unspecified: Secondary | ICD-10-CM | POA: Diagnosis not present

## 2022-03-25 DIAGNOSIS — I10 Essential (primary) hypertension: Secondary | ICD-10-CM | POA: Diagnosis not present

## 2022-03-25 DIAGNOSIS — Z7982 Long term (current) use of aspirin: Secondary | ICD-10-CM | POA: Diagnosis not present

## 2022-03-25 DIAGNOSIS — Z452 Encounter for adjustment and management of vascular access device: Secondary | ICD-10-CM | POA: Diagnosis not present

## 2022-03-25 DIAGNOSIS — Z79891 Long term (current) use of opiate analgesic: Secondary | ICD-10-CM | POA: Diagnosis not present

## 2022-03-25 DIAGNOSIS — T8451XD Infection and inflammatory reaction due to internal right hip prosthesis, subsequent encounter: Secondary | ICD-10-CM | POA: Diagnosis not present

## 2022-03-25 DIAGNOSIS — I2510 Atherosclerotic heart disease of native coronary artery without angina pectoris: Secondary | ICD-10-CM | POA: Diagnosis not present

## 2022-03-25 DIAGNOSIS — I252 Old myocardial infarction: Secondary | ICD-10-CM | POA: Diagnosis not present

## 2022-03-25 DIAGNOSIS — E119 Type 2 diabetes mellitus without complications: Secondary | ICD-10-CM | POA: Diagnosis not present

## 2022-03-25 DIAGNOSIS — M00851 Arthritis due to other bacteria, right hip: Secondary | ICD-10-CM | POA: Diagnosis not present

## 2022-03-25 DIAGNOSIS — A419 Sepsis, unspecified organism: Secondary | ICD-10-CM | POA: Diagnosis not present

## 2022-03-27 DIAGNOSIS — Z789 Other specified health status: Secondary | ICD-10-CM | POA: Diagnosis not present

## 2022-03-27 DIAGNOSIS — Z7409 Other reduced mobility: Secondary | ICD-10-CM | POA: Diagnosis not present

## 2022-03-28 DIAGNOSIS — I10 Essential (primary) hypertension: Secondary | ICD-10-CM | POA: Diagnosis not present

## 2022-03-28 DIAGNOSIS — I2510 Atherosclerotic heart disease of native coronary artery without angina pectoris: Secondary | ICD-10-CM | POA: Diagnosis not present

## 2022-03-28 DIAGNOSIS — Z792 Long term (current) use of antibiotics: Secondary | ICD-10-CM | POA: Diagnosis not present

## 2022-03-28 DIAGNOSIS — Z7984 Long term (current) use of oral hypoglycemic drugs: Secondary | ICD-10-CM | POA: Diagnosis not present

## 2022-03-28 DIAGNOSIS — I252 Old myocardial infarction: Secondary | ICD-10-CM | POA: Diagnosis not present

## 2022-03-28 DIAGNOSIS — E559 Vitamin D deficiency, unspecified: Secondary | ICD-10-CM | POA: Diagnosis not present

## 2022-03-28 DIAGNOSIS — Z79891 Long term (current) use of opiate analgesic: Secondary | ICD-10-CM | POA: Diagnosis not present

## 2022-03-28 DIAGNOSIS — N179 Acute kidney failure, unspecified: Secondary | ICD-10-CM | POA: Diagnosis not present

## 2022-03-28 DIAGNOSIS — Z7982 Long term (current) use of aspirin: Secondary | ICD-10-CM | POA: Diagnosis not present

## 2022-03-28 DIAGNOSIS — A419 Sepsis, unspecified organism: Secondary | ICD-10-CM | POA: Diagnosis not present

## 2022-03-28 DIAGNOSIS — Z87891 Personal history of nicotine dependence: Secondary | ICD-10-CM | POA: Diagnosis not present

## 2022-03-28 DIAGNOSIS — Z7901 Long term (current) use of anticoagulants: Secondary | ICD-10-CM | POA: Diagnosis not present

## 2022-03-28 DIAGNOSIS — E119 Type 2 diabetes mellitus without complications: Secondary | ICD-10-CM | POA: Diagnosis not present

## 2022-03-28 DIAGNOSIS — T8451XD Infection and inflammatory reaction due to internal right hip prosthesis, subsequent encounter: Secondary | ICD-10-CM | POA: Diagnosis not present

## 2022-03-28 DIAGNOSIS — E7800 Pure hypercholesterolemia, unspecified: Secondary | ICD-10-CM | POA: Diagnosis not present

## 2022-03-28 DIAGNOSIS — M00851 Arthritis due to other bacteria, right hip: Secondary | ICD-10-CM | POA: Diagnosis not present

## 2022-03-28 DIAGNOSIS — T8451XA Infection and inflammatory reaction due to internal right hip prosthesis, initial encounter: Secondary | ICD-10-CM | POA: Diagnosis not present

## 2022-03-28 DIAGNOSIS — Z452 Encounter for adjustment and management of vascular access device: Secondary | ICD-10-CM | POA: Diagnosis not present

## 2022-03-28 DIAGNOSIS — Z955 Presence of coronary angioplasty implant and graft: Secondary | ICD-10-CM | POA: Diagnosis not present

## 2022-03-28 DIAGNOSIS — N390 Urinary tract infection, site not specified: Secondary | ICD-10-CM | POA: Diagnosis not present

## 2022-03-28 DIAGNOSIS — K219 Gastro-esophageal reflux disease without esophagitis: Secondary | ICD-10-CM | POA: Diagnosis not present

## 2022-03-30 DIAGNOSIS — T8451XD Infection and inflammatory reaction due to internal right hip prosthesis, subsequent encounter: Secondary | ICD-10-CM | POA: Diagnosis not present

## 2022-03-30 DIAGNOSIS — E559 Vitamin D deficiency, unspecified: Secondary | ICD-10-CM | POA: Diagnosis not present

## 2022-03-30 DIAGNOSIS — I2510 Atherosclerotic heart disease of native coronary artery without angina pectoris: Secondary | ICD-10-CM | POA: Diagnosis not present

## 2022-03-30 DIAGNOSIS — I10 Essential (primary) hypertension: Secondary | ICD-10-CM | POA: Diagnosis not present

## 2022-03-30 DIAGNOSIS — K219 Gastro-esophageal reflux disease without esophagitis: Secondary | ICD-10-CM | POA: Diagnosis not present

## 2022-03-30 DIAGNOSIS — Z7984 Long term (current) use of oral hypoglycemic drugs: Secondary | ICD-10-CM | POA: Diagnosis not present

## 2022-03-30 DIAGNOSIS — I252 Old myocardial infarction: Secondary | ICD-10-CM | POA: Diagnosis not present

## 2022-03-30 DIAGNOSIS — Z955 Presence of coronary angioplasty implant and graft: Secondary | ICD-10-CM | POA: Diagnosis not present

## 2022-03-30 DIAGNOSIS — Z87891 Personal history of nicotine dependence: Secondary | ICD-10-CM | POA: Diagnosis not present

## 2022-03-30 DIAGNOSIS — Z7982 Long term (current) use of aspirin: Secondary | ICD-10-CM | POA: Diagnosis not present

## 2022-03-30 DIAGNOSIS — A419 Sepsis, unspecified organism: Secondary | ICD-10-CM | POA: Diagnosis not present

## 2022-03-30 DIAGNOSIS — Z7901 Long term (current) use of anticoagulants: Secondary | ICD-10-CM | POA: Diagnosis not present

## 2022-03-30 DIAGNOSIS — N390 Urinary tract infection, site not specified: Secondary | ICD-10-CM | POA: Diagnosis not present

## 2022-03-30 DIAGNOSIS — N179 Acute kidney failure, unspecified: Secondary | ICD-10-CM | POA: Diagnosis not present

## 2022-03-30 DIAGNOSIS — Z452 Encounter for adjustment and management of vascular access device: Secondary | ICD-10-CM | POA: Diagnosis not present

## 2022-03-30 DIAGNOSIS — M00851 Arthritis due to other bacteria, right hip: Secondary | ICD-10-CM | POA: Diagnosis not present

## 2022-03-30 DIAGNOSIS — Z79891 Long term (current) use of opiate analgesic: Secondary | ICD-10-CM | POA: Diagnosis not present

## 2022-03-30 DIAGNOSIS — Z792 Long term (current) use of antibiotics: Secondary | ICD-10-CM | POA: Diagnosis not present

## 2022-03-30 DIAGNOSIS — E7800 Pure hypercholesterolemia, unspecified: Secondary | ICD-10-CM | POA: Diagnosis not present

## 2022-03-30 DIAGNOSIS — E119 Type 2 diabetes mellitus without complications: Secondary | ICD-10-CM | POA: Diagnosis not present

## 2022-03-31 DIAGNOSIS — M00851 Arthritis due to other bacteria, right hip: Secondary | ICD-10-CM | POA: Diagnosis not present

## 2022-03-31 DIAGNOSIS — Z792 Long term (current) use of antibiotics: Secondary | ICD-10-CM | POA: Diagnosis not present

## 2022-03-31 DIAGNOSIS — R7881 Bacteremia: Secondary | ICD-10-CM | POA: Diagnosis not present

## 2022-04-04 DIAGNOSIS — Z955 Presence of coronary angioplasty implant and graft: Secondary | ICD-10-CM | POA: Diagnosis not present

## 2022-04-04 DIAGNOSIS — N390 Urinary tract infection, site not specified: Secondary | ICD-10-CM | POA: Diagnosis not present

## 2022-04-04 DIAGNOSIS — I2510 Atherosclerotic heart disease of native coronary artery without angina pectoris: Secondary | ICD-10-CM | POA: Diagnosis not present

## 2022-04-04 DIAGNOSIS — E559 Vitamin D deficiency, unspecified: Secondary | ICD-10-CM | POA: Diagnosis not present

## 2022-04-04 DIAGNOSIS — Z7901 Long term (current) use of anticoagulants: Secondary | ICD-10-CM | POA: Diagnosis not present

## 2022-04-04 DIAGNOSIS — N179 Acute kidney failure, unspecified: Secondary | ICD-10-CM | POA: Diagnosis not present

## 2022-04-04 DIAGNOSIS — Z7984 Long term (current) use of oral hypoglycemic drugs: Secondary | ICD-10-CM | POA: Diagnosis not present

## 2022-04-04 DIAGNOSIS — K219 Gastro-esophageal reflux disease without esophagitis: Secondary | ICD-10-CM | POA: Diagnosis not present

## 2022-04-04 DIAGNOSIS — E7800 Pure hypercholesterolemia, unspecified: Secondary | ICD-10-CM | POA: Diagnosis not present

## 2022-04-04 DIAGNOSIS — T8451XD Infection and inflammatory reaction due to internal right hip prosthesis, subsequent encounter: Secondary | ICD-10-CM | POA: Diagnosis not present

## 2022-04-04 DIAGNOSIS — M00851 Arthritis due to other bacteria, right hip: Secondary | ICD-10-CM | POA: Diagnosis not present

## 2022-04-04 DIAGNOSIS — Z87891 Personal history of nicotine dependence: Secondary | ICD-10-CM | POA: Diagnosis not present

## 2022-04-04 DIAGNOSIS — E119 Type 2 diabetes mellitus without complications: Secondary | ICD-10-CM | POA: Diagnosis not present

## 2022-04-04 DIAGNOSIS — I10 Essential (primary) hypertension: Secondary | ICD-10-CM | POA: Diagnosis not present

## 2022-04-04 DIAGNOSIS — Z452 Encounter for adjustment and management of vascular access device: Secondary | ICD-10-CM | POA: Diagnosis not present

## 2022-04-04 DIAGNOSIS — Z79891 Long term (current) use of opiate analgesic: Secondary | ICD-10-CM | POA: Diagnosis not present

## 2022-04-04 DIAGNOSIS — I252 Old myocardial infarction: Secondary | ICD-10-CM | POA: Diagnosis not present

## 2022-04-04 DIAGNOSIS — Z7982 Long term (current) use of aspirin: Secondary | ICD-10-CM | POA: Diagnosis not present

## 2022-04-04 DIAGNOSIS — Z792 Long term (current) use of antibiotics: Secondary | ICD-10-CM | POA: Diagnosis not present

## 2022-04-04 DIAGNOSIS — A419 Sepsis, unspecified organism: Secondary | ICD-10-CM | POA: Diagnosis not present

## 2022-04-05 DIAGNOSIS — I82611 Acute embolism and thrombosis of superficial veins of right upper extremity: Secondary | ICD-10-CM | POA: Diagnosis not present

## 2022-04-05 DIAGNOSIS — Z87891 Personal history of nicotine dependence: Secondary | ICD-10-CM | POA: Diagnosis not present

## 2022-04-05 DIAGNOSIS — I252 Old myocardial infarction: Secondary | ICD-10-CM | POA: Diagnosis not present

## 2022-04-05 DIAGNOSIS — I82621 Acute embolism and thrombosis of deep veins of right upper extremity: Secondary | ICD-10-CM | POA: Diagnosis not present

## 2022-04-05 DIAGNOSIS — I82A11 Acute embolism and thrombosis of right axillary vein: Secondary | ICD-10-CM | POA: Diagnosis not present

## 2022-04-05 DIAGNOSIS — M7989 Other specified soft tissue disorders: Secondary | ICD-10-CM | POA: Diagnosis not present

## 2022-04-05 DIAGNOSIS — M79601 Pain in right arm: Secondary | ICD-10-CM | POA: Diagnosis not present

## 2022-04-05 DIAGNOSIS — I2510 Atherosclerotic heart disease of native coronary artery without angina pectoris: Secondary | ICD-10-CM | POA: Diagnosis not present

## 2022-04-05 DIAGNOSIS — I10 Essential (primary) hypertension: Secondary | ICD-10-CM | POA: Diagnosis not present

## 2022-04-05 DIAGNOSIS — I82B11 Acute embolism and thrombosis of right subclavian vein: Secondary | ICD-10-CM | POA: Diagnosis not present

## 2022-04-06 DIAGNOSIS — Z7982 Long term (current) use of aspirin: Secondary | ICD-10-CM | POA: Diagnosis not present

## 2022-04-06 DIAGNOSIS — Z7984 Long term (current) use of oral hypoglycemic drugs: Secondary | ICD-10-CM | POA: Diagnosis not present

## 2022-04-06 DIAGNOSIS — Z792 Long term (current) use of antibiotics: Secondary | ICD-10-CM | POA: Diagnosis not present

## 2022-04-06 DIAGNOSIS — M00851 Arthritis due to other bacteria, right hip: Secondary | ICD-10-CM | POA: Diagnosis not present

## 2022-04-06 DIAGNOSIS — E7800 Pure hypercholesterolemia, unspecified: Secondary | ICD-10-CM | POA: Diagnosis not present

## 2022-04-06 DIAGNOSIS — Z79891 Long term (current) use of opiate analgesic: Secondary | ICD-10-CM | POA: Diagnosis not present

## 2022-04-06 DIAGNOSIS — E559 Vitamin D deficiency, unspecified: Secondary | ICD-10-CM | POA: Diagnosis not present

## 2022-04-06 DIAGNOSIS — N390 Urinary tract infection, site not specified: Secondary | ICD-10-CM | POA: Diagnosis not present

## 2022-04-06 DIAGNOSIS — Z7901 Long term (current) use of anticoagulants: Secondary | ICD-10-CM | POA: Diagnosis not present

## 2022-04-06 DIAGNOSIS — I2510 Atherosclerotic heart disease of native coronary artery without angina pectoris: Secondary | ICD-10-CM | POA: Diagnosis not present

## 2022-04-06 DIAGNOSIS — N179 Acute kidney failure, unspecified: Secondary | ICD-10-CM | POA: Diagnosis not present

## 2022-04-06 DIAGNOSIS — A419 Sepsis, unspecified organism: Secondary | ICD-10-CM | POA: Diagnosis not present

## 2022-04-06 DIAGNOSIS — T8451XD Infection and inflammatory reaction due to internal right hip prosthesis, subsequent encounter: Secondary | ICD-10-CM | POA: Diagnosis not present

## 2022-04-06 DIAGNOSIS — I252 Old myocardial infarction: Secondary | ICD-10-CM | POA: Diagnosis not present

## 2022-04-06 DIAGNOSIS — K219 Gastro-esophageal reflux disease without esophagitis: Secondary | ICD-10-CM | POA: Diagnosis not present

## 2022-04-06 DIAGNOSIS — I10 Essential (primary) hypertension: Secondary | ICD-10-CM | POA: Diagnosis not present

## 2022-04-06 DIAGNOSIS — Z87891 Personal history of nicotine dependence: Secondary | ICD-10-CM | POA: Diagnosis not present

## 2022-04-06 DIAGNOSIS — Z955 Presence of coronary angioplasty implant and graft: Secondary | ICD-10-CM | POA: Diagnosis not present

## 2022-04-06 DIAGNOSIS — E119 Type 2 diabetes mellitus without complications: Secondary | ICD-10-CM | POA: Diagnosis not present

## 2022-04-06 DIAGNOSIS — Z452 Encounter for adjustment and management of vascular access device: Secondary | ICD-10-CM | POA: Diagnosis not present

## 2022-04-07 DIAGNOSIS — I340 Nonrheumatic mitral (valve) insufficiency: Secondary | ICD-10-CM | POA: Diagnosis not present

## 2022-04-07 DIAGNOSIS — I2510 Atherosclerotic heart disease of native coronary artery without angina pectoris: Secondary | ICD-10-CM | POA: Diagnosis not present

## 2022-04-07 DIAGNOSIS — Z955 Presence of coronary angioplasty implant and graft: Secondary | ICD-10-CM | POA: Diagnosis not present

## 2022-04-11 DIAGNOSIS — Z87891 Personal history of nicotine dependence: Secondary | ICD-10-CM | POA: Diagnosis not present

## 2022-04-11 DIAGNOSIS — E119 Type 2 diabetes mellitus without complications: Secondary | ICD-10-CM | POA: Diagnosis not present

## 2022-04-11 DIAGNOSIS — K219 Gastro-esophageal reflux disease without esophagitis: Secondary | ICD-10-CM | POA: Diagnosis not present

## 2022-04-11 DIAGNOSIS — Z7901 Long term (current) use of anticoagulants: Secondary | ICD-10-CM | POA: Diagnosis not present

## 2022-04-11 DIAGNOSIS — Z7984 Long term (current) use of oral hypoglycemic drugs: Secondary | ICD-10-CM | POA: Diagnosis not present

## 2022-04-11 DIAGNOSIS — Z79891 Long term (current) use of opiate analgesic: Secondary | ICD-10-CM | POA: Diagnosis not present

## 2022-04-11 DIAGNOSIS — T8451XD Infection and inflammatory reaction due to internal right hip prosthesis, subsequent encounter: Secondary | ICD-10-CM | POA: Diagnosis not present

## 2022-04-11 DIAGNOSIS — Z7982 Long term (current) use of aspirin: Secondary | ICD-10-CM | POA: Diagnosis not present

## 2022-04-11 DIAGNOSIS — I252 Old myocardial infarction: Secondary | ICD-10-CM | POA: Diagnosis not present

## 2022-04-11 DIAGNOSIS — Z792 Long term (current) use of antibiotics: Secondary | ICD-10-CM | POA: Diagnosis not present

## 2022-04-11 DIAGNOSIS — I2510 Atherosclerotic heart disease of native coronary artery without angina pectoris: Secondary | ICD-10-CM | POA: Diagnosis not present

## 2022-04-11 DIAGNOSIS — N390 Urinary tract infection, site not specified: Secondary | ICD-10-CM | POA: Diagnosis not present

## 2022-04-11 DIAGNOSIS — Z955 Presence of coronary angioplasty implant and graft: Secondary | ICD-10-CM | POA: Diagnosis not present

## 2022-04-11 DIAGNOSIS — E7800 Pure hypercholesterolemia, unspecified: Secondary | ICD-10-CM | POA: Diagnosis not present

## 2022-04-11 DIAGNOSIS — E559 Vitamin D deficiency, unspecified: Secondary | ICD-10-CM | POA: Diagnosis not present

## 2022-04-11 DIAGNOSIS — I10 Essential (primary) hypertension: Secondary | ICD-10-CM | POA: Diagnosis not present

## 2022-04-11 DIAGNOSIS — Z452 Encounter for adjustment and management of vascular access device: Secondary | ICD-10-CM | POA: Diagnosis not present

## 2022-04-11 DIAGNOSIS — N179 Acute kidney failure, unspecified: Secondary | ICD-10-CM | POA: Diagnosis not present

## 2022-04-11 DIAGNOSIS — A419 Sepsis, unspecified organism: Secondary | ICD-10-CM | POA: Diagnosis not present

## 2022-04-11 DIAGNOSIS — M00851 Arthritis due to other bacteria, right hip: Secondary | ICD-10-CM | POA: Diagnosis not present

## 2022-04-14 DIAGNOSIS — Z7901 Long term (current) use of anticoagulants: Secondary | ICD-10-CM | POA: Diagnosis not present

## 2022-04-14 DIAGNOSIS — I252 Old myocardial infarction: Secondary | ICD-10-CM | POA: Diagnosis not present

## 2022-04-14 DIAGNOSIS — T8451XD Infection and inflammatory reaction due to internal right hip prosthesis, subsequent encounter: Secondary | ICD-10-CM | POA: Diagnosis not present

## 2022-04-14 DIAGNOSIS — E119 Type 2 diabetes mellitus without complications: Secondary | ICD-10-CM | POA: Diagnosis not present

## 2022-04-14 DIAGNOSIS — N390 Urinary tract infection, site not specified: Secondary | ICD-10-CM | POA: Diagnosis not present

## 2022-04-14 DIAGNOSIS — Z87891 Personal history of nicotine dependence: Secondary | ICD-10-CM | POA: Diagnosis not present

## 2022-04-14 DIAGNOSIS — Z452 Encounter for adjustment and management of vascular access device: Secondary | ICD-10-CM | POA: Diagnosis not present

## 2022-04-14 DIAGNOSIS — Z955 Presence of coronary angioplasty implant and graft: Secondary | ICD-10-CM | POA: Diagnosis not present

## 2022-04-14 DIAGNOSIS — I10 Essential (primary) hypertension: Secondary | ICD-10-CM | POA: Diagnosis not present

## 2022-04-14 DIAGNOSIS — M00851 Arthritis due to other bacteria, right hip: Secondary | ICD-10-CM | POA: Diagnosis not present

## 2022-04-14 DIAGNOSIS — E559 Vitamin D deficiency, unspecified: Secondary | ICD-10-CM | POA: Diagnosis not present

## 2022-04-14 DIAGNOSIS — Z7982 Long term (current) use of aspirin: Secondary | ICD-10-CM | POA: Diagnosis not present

## 2022-04-14 DIAGNOSIS — N179 Acute kidney failure, unspecified: Secondary | ICD-10-CM | POA: Diagnosis not present

## 2022-04-14 DIAGNOSIS — Z79891 Long term (current) use of opiate analgesic: Secondary | ICD-10-CM | POA: Diagnosis not present

## 2022-04-14 DIAGNOSIS — E7800 Pure hypercholesterolemia, unspecified: Secondary | ICD-10-CM | POA: Diagnosis not present

## 2022-04-14 DIAGNOSIS — I2510 Atherosclerotic heart disease of native coronary artery without angina pectoris: Secondary | ICD-10-CM | POA: Diagnosis not present

## 2022-04-14 DIAGNOSIS — A419 Sepsis, unspecified organism: Secondary | ICD-10-CM | POA: Diagnosis not present

## 2022-04-14 DIAGNOSIS — Z792 Long term (current) use of antibiotics: Secondary | ICD-10-CM | POA: Diagnosis not present

## 2022-04-14 DIAGNOSIS — K219 Gastro-esophageal reflux disease without esophagitis: Secondary | ICD-10-CM | POA: Diagnosis not present

## 2022-04-14 DIAGNOSIS — Z7984 Long term (current) use of oral hypoglycemic drugs: Secondary | ICD-10-CM | POA: Diagnosis not present

## 2022-04-20 DIAGNOSIS — Z87891 Personal history of nicotine dependence: Secondary | ICD-10-CM | POA: Diagnosis not present

## 2022-04-20 DIAGNOSIS — N179 Acute kidney failure, unspecified: Secondary | ICD-10-CM | POA: Diagnosis not present

## 2022-04-20 DIAGNOSIS — Z7982 Long term (current) use of aspirin: Secondary | ICD-10-CM | POA: Diagnosis not present

## 2022-04-20 DIAGNOSIS — I2510 Atherosclerotic heart disease of native coronary artery without angina pectoris: Secondary | ICD-10-CM | POA: Diagnosis not present

## 2022-04-20 DIAGNOSIS — E7800 Pure hypercholesterolemia, unspecified: Secondary | ICD-10-CM | POA: Diagnosis not present

## 2022-04-20 DIAGNOSIS — M00851 Arthritis due to other bacteria, right hip: Secondary | ICD-10-CM | POA: Diagnosis not present

## 2022-04-20 DIAGNOSIS — I10 Essential (primary) hypertension: Secondary | ICD-10-CM | POA: Diagnosis not present

## 2022-04-20 DIAGNOSIS — N390 Urinary tract infection, site not specified: Secondary | ICD-10-CM | POA: Diagnosis not present

## 2022-04-20 DIAGNOSIS — Z955 Presence of coronary angioplasty implant and graft: Secondary | ICD-10-CM | POA: Diagnosis not present

## 2022-04-20 DIAGNOSIS — K219 Gastro-esophageal reflux disease without esophagitis: Secondary | ICD-10-CM | POA: Diagnosis not present

## 2022-04-20 DIAGNOSIS — Z792 Long term (current) use of antibiotics: Secondary | ICD-10-CM | POA: Diagnosis not present

## 2022-04-20 DIAGNOSIS — I252 Old myocardial infarction: Secondary | ICD-10-CM | POA: Diagnosis not present

## 2022-04-20 DIAGNOSIS — E559 Vitamin D deficiency, unspecified: Secondary | ICD-10-CM | POA: Diagnosis not present

## 2022-04-20 DIAGNOSIS — Z452 Encounter for adjustment and management of vascular access device: Secondary | ICD-10-CM | POA: Diagnosis not present

## 2022-04-20 DIAGNOSIS — A419 Sepsis, unspecified organism: Secondary | ICD-10-CM | POA: Diagnosis not present

## 2022-04-20 DIAGNOSIS — E119 Type 2 diabetes mellitus without complications: Secondary | ICD-10-CM | POA: Diagnosis not present

## 2022-04-20 DIAGNOSIS — T8451XD Infection and inflammatory reaction due to internal right hip prosthesis, subsequent encounter: Secondary | ICD-10-CM | POA: Diagnosis not present

## 2022-04-20 DIAGNOSIS — Z7984 Long term (current) use of oral hypoglycemic drugs: Secondary | ICD-10-CM | POA: Diagnosis not present

## 2022-04-20 DIAGNOSIS — Z79891 Long term (current) use of opiate analgesic: Secondary | ICD-10-CM | POA: Diagnosis not present

## 2022-04-20 DIAGNOSIS — Z7901 Long term (current) use of anticoagulants: Secondary | ICD-10-CM | POA: Diagnosis not present

## 2022-04-25 DIAGNOSIS — E119 Type 2 diabetes mellitus without complications: Secondary | ICD-10-CM | POA: Diagnosis not present

## 2022-04-25 DIAGNOSIS — Z452 Encounter for adjustment and management of vascular access device: Secondary | ICD-10-CM | POA: Diagnosis not present

## 2022-04-25 DIAGNOSIS — Z792 Long term (current) use of antibiotics: Secondary | ICD-10-CM | POA: Diagnosis not present

## 2022-04-25 DIAGNOSIS — E559 Vitamin D deficiency, unspecified: Secondary | ICD-10-CM | POA: Diagnosis not present

## 2022-04-25 DIAGNOSIS — I2510 Atherosclerotic heart disease of native coronary artery without angina pectoris: Secondary | ICD-10-CM | POA: Diagnosis not present

## 2022-04-25 DIAGNOSIS — Z7984 Long term (current) use of oral hypoglycemic drugs: Secondary | ICD-10-CM | POA: Diagnosis not present

## 2022-04-25 DIAGNOSIS — I10 Essential (primary) hypertension: Secondary | ICD-10-CM | POA: Diagnosis not present

## 2022-04-25 DIAGNOSIS — A419 Sepsis, unspecified organism: Secondary | ICD-10-CM | POA: Diagnosis not present

## 2022-04-25 DIAGNOSIS — Z7901 Long term (current) use of anticoagulants: Secondary | ICD-10-CM | POA: Diagnosis not present

## 2022-04-25 DIAGNOSIS — N179 Acute kidney failure, unspecified: Secondary | ICD-10-CM | POA: Diagnosis not present

## 2022-04-25 DIAGNOSIS — Z87891 Personal history of nicotine dependence: Secondary | ICD-10-CM | POA: Diagnosis not present

## 2022-04-25 DIAGNOSIS — Z79891 Long term (current) use of opiate analgesic: Secondary | ICD-10-CM | POA: Diagnosis not present

## 2022-04-25 DIAGNOSIS — N390 Urinary tract infection, site not specified: Secondary | ICD-10-CM | POA: Diagnosis not present

## 2022-04-25 DIAGNOSIS — K219 Gastro-esophageal reflux disease without esophagitis: Secondary | ICD-10-CM | POA: Diagnosis not present

## 2022-04-25 DIAGNOSIS — Z955 Presence of coronary angioplasty implant and graft: Secondary | ICD-10-CM | POA: Diagnosis not present

## 2022-04-25 DIAGNOSIS — I252 Old myocardial infarction: Secondary | ICD-10-CM | POA: Diagnosis not present

## 2022-04-25 DIAGNOSIS — T8451XD Infection and inflammatory reaction due to internal right hip prosthesis, subsequent encounter: Secondary | ICD-10-CM | POA: Diagnosis not present

## 2022-04-25 DIAGNOSIS — Z7982 Long term (current) use of aspirin: Secondary | ICD-10-CM | POA: Diagnosis not present

## 2022-04-25 DIAGNOSIS — E7800 Pure hypercholesterolemia, unspecified: Secondary | ICD-10-CM | POA: Diagnosis not present

## 2022-04-25 DIAGNOSIS — M00851 Arthritis due to other bacteria, right hip: Secondary | ICD-10-CM | POA: Diagnosis not present

## 2022-04-27 ENCOUNTER — Other Ambulatory Visit: Payer: Self-pay | Admitting: Family

## 2022-04-27 DIAGNOSIS — K219 Gastro-esophageal reflux disease without esophagitis: Secondary | ICD-10-CM

## 2022-04-27 DIAGNOSIS — E559 Vitamin D deficiency, unspecified: Secondary | ICD-10-CM

## 2022-04-28 DIAGNOSIS — M00851 Arthritis due to other bacteria, right hip: Secondary | ICD-10-CM | POA: Diagnosis not present

## 2022-04-28 DIAGNOSIS — T8451XA Infection and inflammatory reaction due to internal right hip prosthesis, initial encounter: Secondary | ICD-10-CM | POA: Diagnosis not present

## 2022-04-28 DIAGNOSIS — I82621 Acute embolism and thrombosis of deep veins of right upper extremity: Secondary | ICD-10-CM | POA: Diagnosis not present

## 2022-04-28 DIAGNOSIS — Z7901 Long term (current) use of anticoagulants: Secondary | ICD-10-CM | POA: Diagnosis not present

## 2022-05-01 ENCOUNTER — Other Ambulatory Visit: Payer: Self-pay | Admitting: Family

## 2022-05-01 DIAGNOSIS — I10 Essential (primary) hypertension: Secondary | ICD-10-CM

## 2022-05-12 DIAGNOSIS — Z955 Presence of coronary angioplasty implant and graft: Secondary | ICD-10-CM | POA: Diagnosis not present

## 2022-05-12 DIAGNOSIS — E7800 Pure hypercholesterolemia, unspecified: Secondary | ICD-10-CM | POA: Diagnosis not present

## 2022-05-12 DIAGNOSIS — I10 Essential (primary) hypertension: Secondary | ICD-10-CM | POA: Diagnosis not present

## 2022-05-12 DIAGNOSIS — I82621 Acute embolism and thrombosis of deep veins of right upper extremity: Secondary | ICD-10-CM | POA: Diagnosis not present

## 2022-05-12 DIAGNOSIS — I252 Old myocardial infarction: Secondary | ICD-10-CM | POA: Diagnosis not present

## 2022-05-12 DIAGNOSIS — I2510 Atherosclerotic heart disease of native coronary artery without angina pectoris: Secondary | ICD-10-CM | POA: Diagnosis not present

## 2022-05-12 DIAGNOSIS — Z720 Tobacco use: Secondary | ICD-10-CM | POA: Diagnosis not present

## 2022-05-22 ENCOUNTER — Other Ambulatory Visit: Payer: Self-pay | Admitting: Family

## 2022-05-22 DIAGNOSIS — I10 Essential (primary) hypertension: Secondary | ICD-10-CM

## 2022-06-02 ENCOUNTER — Encounter: Payer: Self-pay | Admitting: Family Medicine

## 2022-06-02 ENCOUNTER — Telehealth (INDEPENDENT_AMBULATORY_CARE_PROVIDER_SITE_OTHER): Payer: Medicare Other | Admitting: Family Medicine

## 2022-06-02 DIAGNOSIS — U071 COVID-19: Secondary | ICD-10-CM | POA: Diagnosis not present

## 2022-06-02 MED ORDER — MOLNUPIRAVIR EUA 200MG CAPSULE: 4.0000 | ORAL_CAPSULE | Freq: Two times a day (BID) | ORAL | 0 refills | Status: AC

## 2022-06-02 MED ORDER — BENZONATATE 100 MG PO CAPS: 200.0000 mg | ORAL_CAPSULE | Freq: Three times a day (TID) | ORAL | 0 refills | Status: DC | PRN

## 2022-06-02 MED ORDER — DEXAMETHASONE 6 MG PO TABS: 6.0000 mg | ORAL_TABLET | Freq: Every day | ORAL | 0 refills | Status: AC

## 2022-06-02 NOTE — Progress Notes (Signed)
Virtual Visit via Telephone Note  I connected with Paul Murillo on 06/02/22 at 11:18 AM by telephone and verified that I am speaking with the correct person using two identifiers. Paul Murillo is currently located at home and nobody is currently with him during this visit. The provider, Loman Brooklyn, FNP is located in their office at time of visit.  I discussed the limitations, risks, security and privacy concerns of performing an evaluation and management service by telephone and the availability of in person appointments. I also discussed with the patient that there may be a patient responsible charge related to this service. The patient expressed understanding and agreed to proceed.  Subjective: PCP: Sharion Balloon, FNP  Chief Complaint  Patient presents with   Covid Positive   Patient complains of cough and chest congestion. Onset of symptoms was 2 days ago, gradually improving since that time. He is drinking plenty of fluids. Evaluation to date: home COVID test positive. Treatment to date: none. He does not smoke.    ROS: Per HPI  Current Outpatient Medications:    alfuzosin (UROXATRAL) 10 MG 24 hr tablet, , Disp: , Rfl:    aspirin 81 MG EC tablet, Take 81 mg by mouth daily., Disp: , Rfl:    atorvastatin (LIPITOR) 80 MG tablet, TAKE 1 TABLET BY MOUTH EVERY DAY, Disp: 90 tablet, Rfl: 4   cefTRIAXone (ROCEPHIN) 2 g injection, , Disp: , Rfl:    dapagliflozin propanediol (FARXIGA) 10 MG TABS tablet, Take by mouth daily., Disp: , Rfl:    levothyroxine (SYNTHROID) 75 MCG tablet, TAKE 1 TABLET BY MOUTH EVERY DAY, Disp: 90 tablet, Rfl: 0   Lidocaine (HM LIDOCAINE PATCH) 4 % PTCH, Apply 1 patch topically daily., Disp: 15 patch, Rfl: 0   metoprolol succinate (TOPROL-XL) 25 MG 24 hr tablet, Take 1 tablet (25 mg total) by mouth daily., Disp: 90 tablet, Rfl: 3   nitroGLYCERIN (NITROSTAT) 0.4 MG SL tablet, Place 1 tablet (0.4 mg total) under the tongue every 5 (five) minutes as needed for  chest pain. Reported on 04/01/2016, Disp: 30 tablet, Rfl: 1   omeprazole (PRILOSEC) 20 MG capsule, TAKE 1 CAPSULE BY MOUTH EVERY DAY, Disp: 90 capsule, Rfl: 1   oxybutynin (DITROPAN-XL) 10 MG 24 hr tablet, Take 1 tablet by mouth daily., Disp: , Rfl:    tamsulosin (FLOMAX) 0.4 MG CAPS capsule, TAKE 1 CAPSULE BY MOUTH EVERY DAY, Disp: 90 capsule, Rfl: 0   telmisartan (MICARDIS) 80 MG tablet, TAKE 1 TABLET BY MOUTH EVERY DAY, Disp: 90 tablet, Rfl: 0   vardenafil (LEVITRA) 20 MG tablet, Take 1 tablet (20 mg total) by mouth daily as needed for erectile dysfunction. (Patient not taking: Reported on 03/22/2022), Disp: 10 tablet, Rfl: 2   Vitamin D, Ergocalciferol, (DRISDOL) 1.25 MG (50000 UNIT) CAPS capsule, TAKE 1 CAPSULE (50,000 UNITS TOTAL) BY MOUTH EVERY 7 (SEVEN) DAYS, Disp: 12 capsule, Rfl: 1  No Known Allergies Past Medical History:  Diagnosis Date   Cancer ([place])    Prostate   Coronary artery disease    ED (erectile dysfunction)    Genitourinary disorder    Headache    Hypercholesteremia    Hypertension    MI (myocardial infarction) ([place])    Reflux    Syncope    Vitamin D deficiency     Observations/Objective: A&O  No respiratory distress or wheezing audible over the phone Mood, judgement, and thought processes all WNL  Assessment and Plan: 1. COVID-19 Discussed expected duration of  viral illnesses and symptoms that should prompt him to go straight to the ER. - molnupiravir EUA (LAGEVRIO) 200 mg CAPS capsule; Take 4 capsules (800 mg total) by mouth 2 (two) times daily for 5 days.  Dispense: 40 capsule; Refill: 0 - dexamethasone (DECADRON) 6 MG tablet; Take 1 tablet (6 mg total) by mouth daily for 5 days.  Dispense: 5 tablet; Refill: 0 - benzonatate (TESSALON PERLES) 100 MG capsule; Take 2 capsules (200 mg total) by mouth 3 (three) times daily as needed for cough.  Dispense: 20 capsule; Refill: 0   Follow Up Instructions:  I discussed the assessment and treatment plan with the  patient. The patient was provided an opportunity to ask questions and all were answered. The patient agreed with the plan and demonstrated an understanding of the instructions.   The patient was advised to call back or seek an in-person evaluation if the symptoms worsen or if the condition fails to improve as anticipated.  The above assessment and management plan was discussed with the patient. The patient verbalized understanding of and has agreed to the management plan. Patient is aware to call the clinic if symptoms persist or worsen. Patient is aware when to return to the clinic for a follow-up visit. Patient educated on when it is appropriate to go to the emergency department.   Time call ended: 11:29 AM  I provided 11 minutes of non-face-to-face time during this encounter.  Hendricks Limes, MSN, APRN, FNP-C [facility] 06/02/22

## 2022-06-16 ENCOUNTER — Other Ambulatory Visit: Payer: Self-pay | Admitting: Family

## 2022-06-23 DIAGNOSIS — M00851 Arthritis due to other bacteria, right hip: Secondary | ICD-10-CM | POA: Diagnosis not present

## 2022-06-23 DIAGNOSIS — I82621 Acute embolism and thrombosis of deep veins of right upper extremity: Secondary | ICD-10-CM | POA: Diagnosis not present

## 2022-06-29 DIAGNOSIS — Z471 Aftercare following joint replacement surgery: Secondary | ICD-10-CM | POA: Diagnosis not present

## 2022-06-29 DIAGNOSIS — T8459XS Infection and inflammatory reaction due to other internal joint prosthesis, sequela: Secondary | ICD-10-CM | POA: Diagnosis not present

## 2022-06-29 DIAGNOSIS — Z96641 Presence of right artificial hip joint: Secondary | ICD-10-CM | POA: Diagnosis not present

## 2022-07-22 ENCOUNTER — Other Ambulatory Visit: Payer: Self-pay | Admitting: Family

## 2022-07-31 ENCOUNTER — Other Ambulatory Visit: Payer: Self-pay | Admitting: Family

## 2022-09-22 ENCOUNTER — Other Ambulatory Visit: Payer: Self-pay | Admitting: Family

## 2022-09-22 ENCOUNTER — Ambulatory Visit (INDEPENDENT_AMBULATORY_CARE_PROVIDER_SITE_OTHER): Payer: Medicare Other | Admitting: Family

## 2022-09-22 ENCOUNTER — Encounter: Payer: Self-pay | Admitting: Family

## 2022-09-22 VITALS — BP 139/88 | HR 71 | Temp 98.9°F | Ht <= 58 in | Wt 204.0 lb

## 2022-09-22 DIAGNOSIS — I10 Essential (primary) hypertension: Secondary | ICD-10-CM

## 2022-09-22 DIAGNOSIS — Z8546 Personal history of malignant neoplasm of prostate: Secondary | ICD-10-CM

## 2022-09-22 DIAGNOSIS — N1831 Chronic kidney disease, stage 3a: Secondary | ICD-10-CM | POA: Diagnosis not present

## 2022-09-22 DIAGNOSIS — E559 Vitamin D deficiency, unspecified: Secondary | ICD-10-CM

## 2022-09-22 DIAGNOSIS — I2510 Atherosclerotic heart disease of native coronary artery without angina pectoris: Secondary | ICD-10-CM | POA: Diagnosis not present

## 2022-09-22 DIAGNOSIS — Z9861 Coronary angioplasty status: Secondary | ICD-10-CM | POA: Diagnosis not present

## 2022-09-22 DIAGNOSIS — Z0001 Encounter for general adult medical examination with abnormal findings: Secondary | ICD-10-CM | POA: Diagnosis not present

## 2022-09-22 DIAGNOSIS — E782 Mixed hyperlipidemia: Secondary | ICD-10-CM

## 2022-09-22 DIAGNOSIS — E039 Hypothyroidism, unspecified: Secondary | ICD-10-CM | POA: Diagnosis not present

## 2022-09-22 DIAGNOSIS — N401 Enlarged prostate with lower urinary tract symptoms: Secondary | ICD-10-CM

## 2022-09-22 DIAGNOSIS — Z0000 Encounter for general adult medical examination without abnormal findings: Secondary | ICD-10-CM | POA: Diagnosis not present

## 2022-09-22 DIAGNOSIS — K219 Gastro-esophageal reflux disease without esophagitis: Secondary | ICD-10-CM | POA: Diagnosis not present

## 2022-09-22 DIAGNOSIS — R351 Nocturia: Secondary | ICD-10-CM

## 2022-09-22 DIAGNOSIS — Z23 Encounter for immunization: Secondary | ICD-10-CM

## 2022-09-22 MED ORDER — METOPROLOL SUCCINATE ER 25 MG PO TB24: 25.0000 mg | ORAL_TABLET | Freq: Every day | ORAL | 3 refills | Status: DC

## 2022-09-22 MED ORDER — OMEPRAZOLE 20 MG PO CPDR: DELAYED_RELEASE_CAPSULE | ORAL | 1 refills | Status: DC

## 2022-09-22 MED ORDER — VITAMIN D (ERGOCALCIFEROL) 1.25 MG (50000 UNIT) PO CAPS: 50000.0000 [IU] | ORAL_CAPSULE | ORAL | 1 refills | Status: DC

## 2022-09-22 MED ORDER — TELMISARTAN 80 MG PO TABS: 80.0000 mg | ORAL_TABLET | Freq: Every day | ORAL | 2 refills | Status: DC

## 2022-09-22 MED ORDER — LEVOTHYROXINE SODIUM 75 MCG PO TABS: 75.0000 ug | ORAL_TABLET | Freq: Every day | ORAL | 2 refills | Status: DC

## 2022-09-22 MED ORDER — ATORVASTATIN CALCIUM 80 MG PO TABS: 80.0000 mg | ORAL_TABLET | Freq: Every day | ORAL | 0 refills | Status: DC

## 2022-09-22 NOTE — Progress Notes (Signed)
Subjective:    Patient ID: Paul Murillo, male    DOB: [birth year], 66 y.o.   MRN: [id number]  Chief Complaint  Patient presents with   Medical Management of Chronic Issues   Pt presents to the office today for CPE and chronic follow up . Pt is followed by Urologists annually for Prostate Cancer. PT states he is followed by Cardiologists annually for CAD. He states he is doing well and retired 05/08/20.  He has CKD and limits NSAID's/  Hypertension This is a chronic problem. The current episode started more than 1 year ago. The problem has been waxing and waning since onset. The problem is uncontrolled. Pertinent negatives include no malaise/fatigue, peripheral edema or shortness of breath. Risk factors for coronary artery disease include dyslipidemia and male gender. The current treatment provides moderate improvement. Identifiable causes of hypertension include a thyroid problem.  Thyroid Problem Presents for follow-up visit. Patient reports no constipation, dry skin, fatigue or hoarse voice. The symptoms have been stable. His past medical history is significant for hyperlipidemia.  Benign Prostatic Hypertrophy This is a chronic problem. The current episode started more than 1 year ago. Irritative symptoms include nocturia (1). Obstructive symptoms do not include an intermittent stream, a slower stream, straining or a weak stream. Past treatments include alfuzosin. The treatment provided moderate relief.  Hyperlipidemia This is a chronic problem. The current episode started more than 1 year ago. The problem is uncontrolled. Pertinent negatives include no shortness of breath. Current antihyperlipidemic treatment includes statins. The current treatment provides moderate improvement of lipids. Risk factors for coronary artery disease include dyslipidemia, hypertension, male sex and a sedentary lifestyle.      Review of Systems  Constitutional:  Negative for fatigue and malaise/fatigue.  HENT:   Negative for hoarse voice.   Respiratory:  Negative for shortness of breath.   Gastrointestinal:  Negative for constipation.  Genitourinary:  Positive for nocturia (1).  All other systems reviewed and are negative.  Family History  Problem Relation Age of Onset   Heart disease Father    Hypertension Father    Diabetes Brother    Cancer Neg Hx    Social History   Socioeconomic History   Marital status: Married    Spouse name: Thayer Headings   Number of children: 2   Years of education: Not on file   Highest education level: 12th grade  Occupational History   Occupation: Retired  Tobacco Use   Smoking status: Former    Packs/day: 0.25    Years: 50.00    Total pack years: 12.50    Types: Cigarettes    Quit date: 12/30/2021    Years since quitting: 0.7   Smokeless tobacco: Never   Tobacco comments:    Quit 2021 - started back  Vaping Use   Vaping Use: Never used  Substance and Sexual Activity   Alcohol use: Yes    Alcohol/week: 0.0 standard drinks of alcohol    Comment: occ.   Drug use: No   Sexual activity: Yes  Other Topics Concern   Not on file  Social History Narrative   Children live in [place]   Social Determinants of Health   Financial Resource Strain: Low Risk  (11/25/2021)   Overall Financial Resource Strain (CARDIA)    Difficulty of Paying Living Expenses: Not hard at all  Food Insecurity: No Food Insecurity (11/25/2021)   Hunger Vital Sign    Worried About Running Out of Food in the Last Year: Never true  Ran Out of Food in the Last Year: Never true  Transportation Needs: No Transportation Needs (11/25/2021)   PRAPARE - Hydrologist (Medical): No    Lack of Transportation (Non-Medical): No  Physical Activity: Inactive (11/25/2021)   Exercise Vital Sign    Days of Exercise per Week: 0 days    Minutes of Exercise per Session: 0 min  Stress: No Stress Concern Present (11/25/2021)   [place]    Feeling of Stress : Not at all  Social Connections: Moderately Integrated (11/25/2021)   Social Connection and Isolation Panel [NHANES]    Frequency of Communication with Friends and Family: More than three times a week    Frequency of Social Gatherings with Friends and Family: More than three times a week    Attends Religious Services: More than 4 times per year    Active Member of [employer] or Organizations: No    Attends Archivist Meetings: Never    Marital Status: Married       Objective:   Physical Exam Vitals reviewed.  Constitutional:      General: He is not in acute distress.    Appearance: He is well-developed.  HENT:     Head: Normocephalic.     Right Ear: Tympanic membrane normal.     Left Ear: Tympanic membrane normal.  Eyes:     General:        Right eye: No discharge.        Left eye: No discharge.     Pupils: Pupils are equal, round, and reactive to light.  Neck:     Thyroid: No thyromegaly.  Cardiovascular:     Rate and Rhythm: Normal rate and regular rhythm.     Heart sounds: Normal heart sounds. No murmur heard. Pulmonary:     Effort: Pulmonary effort is normal. No respiratory distress.     Breath sounds: Normal breath sounds. No wheezing.  Abdominal:     General: Bowel sounds are normal. There is no distension.     Palpations: Abdomen is soft.     Tenderness: There is no abdominal tenderness.  Musculoskeletal:        General: No tenderness. Normal range of motion.     Cervical back: Normal range of motion and neck supple.  Skin:    General: Skin is warm and dry.     Findings: No erythema or rash.  Neurological:     Mental Status: He is alert and oriented to person, place, and time.     Cranial Nerves: No cranial nerve deficit.     Deep Tendon Reflexes: Reflexes are normal and symmetric.  Psychiatric:        Behavior: Behavior normal.        Thought Content: Thought content normal.        Judgment: Judgment normal.        BP 139/88   Pulse 71   Temp 98.9 F (37.2 C) (Temporal)   Ht 2' (0.61 m)   Wt 204 lb (92.5 kg)   SpO2 98%   BMI 249.01 kg/m      Assessment & Plan:  Paul Murillo comes in today with chief complaint of Medical Management of Chronic Issues   Diagnosis and orders addressed:  1. CAD S/P percutaneous coronary angioplasty - metoprolol succinate (TOPROL-XL) 25 MG 24 hr tablet; Take 1 tablet (25 mg total) by mouth daily.  Dispense: 90 tablet; Refill:  3 - CMP14+EGFR - CBC with Differential/Platelet  2. Gastroesophageal reflux disease, unspecified whether esophagitis present - omeprazole (PRILOSEC) 20 MG capsule; TAKE 1 CAPSULE BY MOUTH EVERY DAY  Dispense: 90 capsule; Refill: 1 - CMP14+EGFR - CBC with Differential/Platelet  3. Essential hypertension - telmisartan (MICARDIS) 80 MG tablet; Take 1 tablet (80 mg total) by mouth daily.  Dispense: 90 tablet; Refill: 2 - CMP14+EGFR - CBC with Differential/Platelet  4. Vitamin D deficiency - Vitamin D, Ergocalciferol, (DRISDOL) 1.25 MG (50000 UNIT) CAPS capsule; Take 1 capsule (50,000 Units total) by mouth every 7 (seven) days.  Dispense: 12 capsule; Refill: 1 - CMP14+EGFR - CBC with Differential/Platelet - VITAMIN D 25 Hydroxy (Vit-D Deficiency, Fractures)  5. Need for immunization against influenza - Flu Vaccine QUAD High Dose(Fluad) - CMP14+EGFR - CBC with Differential/Platelet  6. Annual physical exam - CMP14+EGFR - CBC with Differential/Platelet - Lipid panel - PSA, total and free - TSH - VITAMIN D 25 Hydroxy (Vit-D Deficiency, Fractures)  7. Benign prostatic hyperplasia with nocturia - CMP14+EGFR - CBC with Differential/Platelet  8. Coronary artery disease involving native coronary artery of native heart without angina pectoris - CMP14+EGFR - CBC with Differential/Platelet  9. H/O malignant neoplasm of prostate - CMP14+EGFR - CBC with Differential/Platelet  10. Primary hypertension - CMP14+EGFR - CBC  with Differential/Platelet  11. Mixed hyperlipidemia - CMP14+EGFR - CBC with Differential/Platelet - Lipid panel  12. Hypothyroidism, unspecified type - CMP14+EGFR - CBC with Differential/Platelet - TSH  13. Stage 3a chronic kidney disease (HCC)  - CMP14+EGFR - CBC with Differential/Platelet   Labs pending Health Maintenance reviewed Diet and exercise encouraged  Follow up plan: 6 months    [name], FNP

## 2022-09-22 NOTE — Patient Instructions (Signed)
Health Maintenance After Age 68 After age 68, you are at a higher risk for certain long-term diseases and infections as well as injuries from falls. Falls are a major cause of broken bones and head injuries in people who are older than age 68. Getting regular preventive care can help to keep you healthy and well. Preventive care includes getting regular testing and making lifestyle changes as recommended by your health care provider. Talk with your health care provider about: Which screenings and tests you should have. A screening is a test that checks for a disease when you have no symptoms. A diet and exercise plan that is right for you. What should I know about screenings and tests to prevent falls? Screening and testing are the best ways to find a health problem early. Early diagnosis and treatment give you the best chance of managing medical conditions that are common after age 68. Certain conditions and lifestyle choices may make you more likely to have a fall. Your health care provider may recommend: Regular vision checks. Poor vision and conditions such as cataracts can make you more likely to have a fall. If you wear glasses, make sure to get your prescription updated if your vision changes. Medicine review. Work with your health care provider to regularly review all of the medicines you are taking, including over-the-counter medicines. Ask your health care provider about any side effects that may make you more likely to have a fall. Tell your health care provider if any medicines that you take make you feel dizzy or sleepy. Strength and balance checks. Your health care provider may recommend certain tests to check your strength and balance while standing, walking, or changing positions. Foot health exam. Foot pain and numbness, as well as not wearing proper footwear, can make you more likely to have a fall. Screenings, including: Osteoporosis screening. Osteoporosis is a condition that causes  the bones to get weaker and break more easily. Blood pressure screening. Blood pressure changes and medicines to control blood pressure can make you feel dizzy. Depression screening. You may be more likely to have a fall if you have a fear of falling, feel depressed, or feel unable to do activities that you used to do. Alcohol use screening. Using too much alcohol can affect your balance and may make you more likely to have a fall. Follow these instructions at home: Lifestyle Do not drink alcohol if: Your health care provider tells you not to drink. If you drink alcohol: Limit how much you have to: 0-1 drink a day for women. 0-2 drinks a day for men. Know how much alcohol is in your drink. In the [place] one drink equals one 12 oz bottle of beer (355 mL), one 5 oz glass of wine (148 mL), or one 1 oz glass of hard liquor (44 mL). Do not use any products that contain nicotine or tobacco. These products include cigarettes, chewing tobacco, and vaping devices, such as e-cigarettes. If you need help quitting, ask your health care provider. Activity  Follow a regular exercise program to stay fit. This will help you maintain your balance. Ask your health care provider what types of exercise are appropriate for you. If you need a cane or walker, use it as recommended by your health care provider. Wear supportive shoes that have nonskid soles. Safety  Remove any tripping hazards, such as rugs, cords, and clutter. Install safety equipment such as grab bars in bathrooms and safety rails on stairs. Keep rooms and walkways   well-lit. General instructions Talk with your health care provider about your risks for falling. Tell your health care provider if: You fall. Be sure to tell your health care provider about all falls, even ones that seem minor. You feel dizzy, tiredness (fatigue), or off-balance. Take over-the-counter and prescription medicines only as told by your health care provider. These include  supplements. Eat a healthy diet and maintain a healthy weight. A healthy diet includes low-fat dairy products, low-fat (lean) meats, and fiber from whole grains, beans, and lots of fruits and vegetables. Stay current with your vaccines. Schedule regular health, dental, and eye exams. Summary Having a healthy lifestyle and getting preventive care can help to protect your health and wellness after age 68. Screening and testing are the best way to find a health problem early and help you avoid having a fall. Early diagnosis and treatment give you the best chance for managing medical conditions that are more common for people who are older than age 68. Falls are a major cause of broken bones and head injuries in people who are older than age 68. Take precautions to prevent a fall at home. Work with your health care provider to learn what changes you can make to improve your health and wellness and to prevent falls. This information is not intended to replace advice given to you by your health care provider. Make sure you discuss any questions you have with your health care provider. Document Revised: 03/01/2021 Document Reviewed: 03/01/2021 Elsevier Patient Education  2023 [employer]  

## 2022-09-23 LAB — CBC WITH DIFFERENTIAL/PLATELET
Basophils Absolute: 0 10*3/uL (ref 0.0–0.2)
Basos: 0 %
EOS (ABSOLUTE): 0.2 10*3/uL (ref 0.0–0.4)
Eos: 3 %
Hematocrit: 40.9 % (ref 37.5–51.0)
Hemoglobin: 13.7 g/dL (ref 13.0–17.7)
Immature Grans (Abs): 0 10*3/uL (ref 0.0–0.1)
Immature Granulocytes: 0 %
Lymphocytes Absolute: 2.2 10*3/uL (ref 0.7–3.1)
Lymphs: 30 %
MCH: 30.5 pg (ref 26.6–33.0)
MCHC: 33.5 g/dL (ref 31.5–35.7)
MCV: 91 fL (ref 79–97)
Monocytes Absolute: 0.6 10*3/uL (ref 0.1–0.9)
Monocytes: 8 %
Neutrophils Absolute: 4.2 10*3/uL (ref 1.4–7.0)
Neutrophils: 59 %
Platelets: 228 10*3/uL (ref 150–450)
RBC: 4.49 x10E6/uL (ref 4.14–5.80)
RDW: 14.9 % (ref 11.6–15.4)
WBC: 7.4 10*3/uL (ref 3.4–10.8)

## 2022-09-23 LAB — CMP14+EGFR
ALT: 26 IU/L (ref 0–44)
AST: 25 IU/L (ref 0–40)
Albumin/Globulin Ratio: 1.8 (ref 1.2–2.2)
Albumin: 4.6 g/dL (ref 3.9–4.9)
Alkaline Phosphatase: 84 IU/L (ref 44–121)
BUN/Creatinine Ratio: 8 — ABNORMAL LOW (ref 10–24)
BUN: 10 mg/dL (ref 8–27)
Bilirubin Total: 0.6 mg/dL (ref 0.0–1.2)
CO2: 21 mmol/L (ref 20–29)
Calcium: 9.9 mg/dL (ref 8.6–10.2)
Chloride: 104 mmol/L (ref 96–106)
Creatinine, Ser: 1.29 mg/dL — ABNORMAL HIGH (ref 0.76–1.27)
Globulin, Total: 2.6 g/dL (ref 1.5–4.5)
Glucose: 110 mg/dL — ABNORMAL HIGH (ref 70–99)
Potassium: 4.6 mmol/L (ref 3.5–5.2)
Sodium: 143 mmol/L (ref 134–144)
Total Protein: 7.2 g/dL (ref 6.0–8.5)
eGFR: 60 mL/min/{1.73_m2} (ref >59)

## 2022-09-23 LAB — LIPID PANEL
Chol/HDL Ratio: 2.9 ratio (ref 0.0–5.0)
Cholesterol, Total: 170 mg/dL (ref 100–199)
HDL: 58 mg/dL (ref >39)
LDL Chol Calc (NIH): 90 mg/dL (ref 0–99)
Triglycerides: 122 mg/dL (ref 0–149)
VLDL Cholesterol Cal: 22 mg/dL (ref 5–40)

## 2022-09-23 LAB — TSH: TSH: 2.22 u[IU]/mL (ref 0.450–4.500)

## 2022-09-23 LAB — PSA, TOTAL AND FREE
PSA, Free Pct: 18 %
PSA, Free: 0.09 ng/mL
Prostate Specific Ag, Serum: 0.5 ng/mL (ref 0.0–4.0)

## 2022-09-23 LAB — VITAMIN D 25 HYDROXY (VIT D DEFICIENCY, FRACTURES): Vit D, 25-Hydroxy: 40.8 ng/mL (ref 30.0–100.0)

## 2022-11-11 ENCOUNTER — Other Ambulatory Visit: Payer: Self-pay | Admitting: Family

## 2022-11-14 DIAGNOSIS — I82621 Acute embolism and thrombosis of deep veins of right upper extremity: Secondary | ICD-10-CM | POA: Diagnosis not present

## 2022-11-14 DIAGNOSIS — N1831 Chronic kidney disease, stage 3a: Secondary | ICD-10-CM | POA: Diagnosis not present

## 2022-11-14 DIAGNOSIS — I129 Hypertensive chronic kidney disease with stage 1 through stage 4 chronic kidney disease, or unspecified chronic kidney disease: Secondary | ICD-10-CM | POA: Diagnosis not present

## 2022-11-14 DIAGNOSIS — I252 Old myocardial infarction: Secondary | ICD-10-CM | POA: Diagnosis not present

## 2022-11-14 DIAGNOSIS — E7800 Pure hypercholesterolemia, unspecified: Secondary | ICD-10-CM | POA: Diagnosis not present

## 2022-11-14 DIAGNOSIS — Z955 Presence of coronary angioplasty implant and graft: Secondary | ICD-10-CM | POA: Diagnosis not present

## 2022-11-14 DIAGNOSIS — I2510 Atherosclerotic heart disease of native coronary artery without angina pectoris: Secondary | ICD-10-CM | POA: Diagnosis not present

## 2022-11-14 DIAGNOSIS — Z720 Tobacco use: Secondary | ICD-10-CM | POA: Diagnosis not present

## 2022-11-23 DIAGNOSIS — I2510 Atherosclerotic heart disease of native coronary artery without angina pectoris: Secondary | ICD-10-CM | POA: Diagnosis not present

## 2022-12-20 ENCOUNTER — Ambulatory Visit (INDEPENDENT_AMBULATORY_CARE_PROVIDER_SITE_OTHER): Payer: Medicare Other

## 2022-12-20 VITALS — Ht 73.0 in | Wt 203.0 lb

## 2022-12-20 DIAGNOSIS — Z0000 Encounter for general adult medical examination without abnormal findings: Secondary | ICD-10-CM

## 2022-12-20 NOTE — Progress Notes (Signed)
Subjective:   Paul Murillo is a 67 y.o. male who presents for Medicare Annual/Subsequent preventive examination. I connected with  [name] on 12/20/22 by a audio enabled telemedicine application and verified that I am speaking with the correct person using two identifiers.  Patient Location: Home  Provider Location: Home Office  I discussed the limitations of evaluation and management by telemedicine. The patient expressed understanding and agreed to proceed.  Review of Systems     Cardiac Risk Factors include: advanced age (>66mn, >>34women);hypertension;male gender;dyslipidemia     Objective:    Today's Vitals   12/20/22 1116  Weight: 203 lb (92.1 kg)  Height: '6\' 1"'$  (1.854 m)   Body mass index is 26.78 kg/m.     12/20/2022   11:19 AM 11/25/2021    2:32 PM 11/24/2020    3:13 PM 03/20/2018    8:49 AM 11/18/2016    9:09 AM 07/06/2016   10:33 AM 01/18/2016    1:29 PM  Advanced Directives  Does Patient Have a Medical Advance Directive? No No Yes No No No No  Type of Advance Directive   [place]     Does patient want to make changes to medical advance directive?   No - Patient declined      [place] of [place]?   No - copy requested      Would patient like information on creating a medical advance directive? No - Patient declined No - Patient declined   No - Patient declined No - patient declined information No - patient declined information    Current Medications (verified) Outpatient Encounter Medications as of 12/20/2022  Medication Sig   alfuzosin (UROXATRAL) 10 MG 24 hr tablet    aspirin 81 MG EC tablet Take 81 mg by mouth daily.   atorvastatin (LIPITOR) 80 MG tablet Take 1 tablet (80 mg total) by mouth daily.   dapagliflozin propanediol (FARXIGA) 10 MG TABS tablet Take by mouth daily.   levothyroxine (SYNTHROID) 75 MCG tablet Take 1 tablet (75 mcg total) by mouth daily.   metoprolol succinate (TOPROL-XL) 25 MG 24 hr  tablet Take 1 tablet (25 mg total) by mouth daily.   nitroGLYCERIN (NITROSTAT) 0.4 MG SL tablet Place 1 tablet (0.4 mg total) under the tongue every 5 (five) minutes as needed for chest pain. Reported on 04/01/2016   omeprazole (PRILOSEC) 20 MG capsule TAKE 1 CAPSULE BY MOUTH EVERY DAY   oxybutynin (DITROPAN-XL) 10 MG 24 hr tablet Take 1 tablet by mouth daily.   tamsulosin (FLOMAX) 0.4 MG CAPS capsule TAKE 1 CAPSULE BY MOUTH EVERY DAY   telmisartan (MICARDIS) 80 MG tablet Take 1 tablet (80 mg total) by mouth daily.   vardenafil (LEVITRA) 20 MG tablet Take 1 tablet (20 mg total) by mouth daily as needed for erectile dysfunction.   Vitamin D, Ergocalciferol, (DRISDOL) 1.25 MG (50000 UNIT) CAPS capsule Take 1 capsule (50,000 Units total) by mouth every 7 (seven) days.   No facility-administered encounter medications on file as of 12/20/2022.    Allergies (verified) Patient has no known allergies.   History: Past Medical History:  Diagnosis Date   Cancer ([facility]    Prostate   Coronary artery disease    ED (erectile dysfunction)    Genitourinary disorder    Headache    Hypercholesteremia    Hypertension    [place]) [place]    Reflux    Syncope    Vitamin D  deficiency    Past Surgical History:  Procedure Laterality Date   APPENDECTOMY     HERNIA REPAIR     LEFT HEART CATHETERIZATION WITH CORONARY ANGIOGRAM N/A 01/29/2014   Procedure: LEFT HEART CATHETERIZATION WITH CORONARY ANGIOGRAM;  Surgeon: Peter M Martinique, MD;  Location: [facility] CATH LAB;  Service: Cardiovascular;  Laterality: N/A;   TOTAL HIP ARTHROPLASTY     Family History  Problem Relation Age of Onset   Heart disease Father    Hypertension Father    Diabetes Brother    Cancer Neg Hx    Social History   Socioeconomic History   Marital status: Married    Spouse name: Thayer Headings   Number of children: 2   Years of education: Not on file   Highest education level: 12th grade  Occupational History   Occupation:  Retired  Tobacco Use   Smoking status: Former    Packs/day: 0.25    Years: 50.00    Total pack years: 12.50    Types: Cigarettes    Quit date: 12/30/2021    Years since quitting: 0.9   Smokeless tobacco: Never   Tobacco comments:    Quit 2021 - started back  Vaping Use   Vaping Use: Never used  Substance and Sexual Activity   Alcohol use: Yes    Alcohol/week: 0.0 standard drinks of alcohol    Comment: occ.   Drug use: No   Sexual activity: Yes  Other Topics Concern   Not on file  Social History Narrative   Children live in [place]   Social Determinants of Health   Financial Resource Strain: Low Risk  (12/20/2022)   Overall Financial Resource Strain (CARDIA)    Difficulty of Paying Living Expenses: Not hard at all  Food Insecurity: No Food Insecurity (12/20/2022)   Hunger Vital Sign    Worried About Running Out of Food in the Last Year: Never true    Ran Out of Food in the Last Year: Never true  Transportation Needs: No Transportation Needs (12/20/2022)   PRAPARE - Hydrologist (Medical): No    Lack of Transportation (Non-Medical): No  Physical Activity: Insufficiently Active (12/20/2022)   Exercise Vital Sign    Days of Exercise per Week: 3 days    Minutes of Exercise per Session: 30 min  Stress: No Stress Concern Present (12/20/2022)   [place]    Feeling of Stress : Not at all  Social Connections: Moderately Integrated (12/20/2022)   Social Connection and Isolation Panel [NHANES]    Frequency of Communication with Friends and Family: More than three times a week    Frequency of Social Gatherings with Friends and Family: More than three times a week    Attends Religious Services: More than 4 times per year    Active Member of [employer] or Organizations: No    Attends Archivist Meetings: Never    Marital Status: Married    Tobacco Counseling Counseling given: Not  Answered Tobacco comments: Quit 2021 - started back   Clinical Intake:  Pre-visit preparation completed: Yes  Pain : No/denies pain     Nutritional Risks: None Diabetes: No  How often do you need to have someone help you when you read instructions, pamphlets, or other written materials from your doctor or pharmacy?: 1 - Never  Diabetic?no   Interpreter Needed?: No  Information entered by :: Jadene Pierini, LPN   Activities of Daily  Living    12/20/2022   11:20 AM  In your present state of health, do you have any difficulty performing the following activities:  Hearing? 0  Vision? 0  Difficulty concentrating or making decisions? 0  Walking or climbing stairs? 0  Dressing or bathing? 0  Doing errands, shopping? 0  Preparing Food and eating ? N  Using the Toilet? N  In the past six months, have you accidently leaked urine? N  Do you have problems with loss of bowel control? N  Managing your Medications? N  Managing your Finances? N  Housekeeping or managing your Housekeeping? N    Patient Care Team: Sharion Balloon, FNP as PCP - General (Family Medicine) Ceasar Mons, MD as Consulting Physician (Urology) Ebbie Ridge, MD as Referring Physician (Cardiology)  Indicate any recent Medical Services you may have received from other than [facility] providers in the past year (date may be approximate).     Assessment:   This is a routine wellness examination for Kvaughn.  Hearing/Vision screen Vision Screening - Comments:: Wears rx glasses - up to date with routine eye exams with  Dr.Lee   Dietary issues and exercise activities discussed:     Goals Addressed             This Visit's Progress    AWV   On track    11/24/2020 AWV Goal: Exercise for General Health  Patient will verbalize understanding of the benefits of increased physical activity: Exercising regularly is important. It will improve your overall fitness, flexibility, and  endurance. Regular exercise also will improve your overall health. It can help you control your weight, reduce stress, and improve your bone density. Over the next year, patient will increase physical activity as tolerated with a goal of at least 150 minutes of moderate physical activity per week.  You can tell that you are exercising at a moderate intensity if your heart starts beating faster and you start breathing faster but can still hold a conversation. Moderate-intensity exercise ideas include: Walking 1 mile (1.6 km) in about 15 minutes Biking Hiking Golfing Dancing Water aerobics Patient will verbalize understanding of everyday activities that increase physical activity by providing examples like the following: Yard work, such as: Sales promotion account executive Gardening Washing windows or floors Patient will be able to explain general safety guidelines for exercising:  Before you start a new exercise program, talk with your health care provider. Do not exercise so much that you hurt yourself, feel dizzy, or get very short of breath. Wear comfortable clothes and wear shoes with good support. Drink plenty of water while you exercise to prevent dehydration or heat stroke. Work out until your breathing and your heartbeat get faster.        Depression Screen    12/20/2022   11:19 AM 09/22/2022    2:14 PM 03/22/2022    1:46 PM 02/03/2022    9:50 AM 11/25/2021    2:28 PM 02/01/2021   10:56 AM 11/24/2020    3:07 PM  PHQ 2/9 Scores  PHQ - 2 Score 0 0 0 0 0 0 0  PHQ- 9 Score  0 1        Fall Risk    12/20/2022   11:17 AM 09/22/2022    2:09 PM 03/22/2022    1:45 PM 02/03/2022    9:49 AM 11/25/2021    2:31 PM  Fall Risk  Falls in the past year? 0 '1 1 1 '$ 0  Number falls in past yr: 0 1 0 0 0  Injury with Fall? 0 '1 1 1 '$ 0  Risk for fall due to : No Fall Risks Impaired balance/gait Orthopedic patient History of  fall(s) No Fall Risks  Follow up Falls prevention discussed Falls evaluation completed Falls evaluation completed Falls evaluation completed Falls prevention discussed    FALL RISK PREVENTION PERTAINING TO THE HOME:  Any stairs in or around the home? Yes  If so, are there any without handrails? No  Home free of loose throw rugs in walkways, pet beds, electrical cords, etc? Yes  Adequate lighting in your home to reduce risk of falls? Yes   ASSISTIVE DEVICES UTILIZED TO PREVENT FALLS:  Life alert? No  Use of a cane, walker or w/c? No  Grab bars in the bathroom? No  Shower chair or bench in shower? No  Elevated toilet seat or a handicapped toilet? No          12/20/2022   11:20 AM 11/25/2021    2:46 PM  6CIT Screen  What Year? 0 points 0 points  What month? 0 points 0 points  What time? 0 points 0 points  Count back from 20 0 points 0 points  Months in reverse 0 points 0 points  Repeat phrase 0 points 0 points  Total Score 0 points 0 points    Immunizations Immunization History  Administered Date(s) Administered   DTaP 01/04/2010   Fluad Quad(high Dose 65+) 09/22/2022   Influenza Whole 08/12/2008   Influenza, High Dose Seasonal PF 08/01/2019, 11/22/2021   Influenza,inj,Quad PF,6+ Mos 08/05/2014, 07/30/2015, 08/25/2016, 08/16/2017, 08/10/2018   Influenza-Unspecified 08/08/2018, 08/08/2020   PFIZER Comirnaty(Gray Top)Covid-19 Tri-Sucrose Vaccine 01/04/2020, 01/25/2020, 08/08/2020   PFIZER(Purple Top)SARS-COV-2 Vaccination 01/04/2020, 01/25/2020, 08/08/2020   Pfizer Covid-19 Vaccine Bivalent Booster 56yr & up 11/22/2021   Pneumococcal Conjugate-13 11/19/2020   Pneumococcal Polysaccharide-23 05/16/2019   Td 01/04/2010   Td (Adult),5 Lf Tetanus Toxid, Preservative Free 01/04/2010   Tdap 01/04/2010   Zoster Recombinat (Shingrix) 09/22/2022   Zoster, Live 02/22/2016    TDAP status: Due, Education has been provided regarding the importance of this vaccine. Advised may  receive this vaccine at local pharmacy or Health Dept. Aware to provide a copy of the vaccination record if obtained from local pharmacy or Health Dept. Verbalized acceptance and understanding.  Flu Vaccine status: Up to date  Pneumococcal vaccine status: Up to date  Covid-19 vaccine status: Completed vaccines  Qualifies for Shingles Vaccine? Yes   Zostavax completed Yes   Shingrix Completed?: Yes  Screening Tests Health Maintenance  Topic Date Due   DTaP/Tdap/Td (5 - Td or Tdap) 01/05/2020   COVID-19 Vaccine (8 - 2023-24 season) 06/24/2022   Fecal DNA (Cologuard)  12/11/2023   Medicare Annual Wellness (AWV)  12/21/2023   Pneumonia Vaccine 67 Years old  Completed   INFLUENZA VACCINE  Completed   Hepatitis C Screening  Completed   Zoster Vaccines- Shingrix  Completed   HPV VACCINES  Aged Out   COLONOSCOPY (Pts [phone] coverage will need to be confirmed)  Discontinued    Health Maintenance  Health Maintenance Due  Topic Date Due   DTaP/Tdap/Td (5 - Td or Tdap) 01/05/2020   COVID-19 Vaccine (8 - 2023-24 season) 06/24/2022    Colorectal cancer screening: Type of screening: Cologuard. Completed 12/10/2020. Repeat every 3 years  Lung Cancer Screening: (Low Dose CT Chest recommended if Age 69-80ears, 3020  pack-year currently smoking OR have quit w/in 15years.) does qualify.   Lung Cancer Screening Referral: declined   Additional Screening:  Hepatitis C Screening: does not qualify; Completed 08/25/2015  Vision Screening: Recommended annual ophthalmology exams for early detection of glaucoma and other disorders of the eye. Is the patient up to date with their annual eye exam?  Yes  Who is the provider or what is the name of the office in which the patient attends annual eye exams? Dr.Lee  If pt is not established with a provider, would they like to be referred to a provider to establish care? No .   Dental Screening: Recommended annual dental exams for proper oral  hygiene  Community Resource Referral / Chronic Care Management: CRR required this visit?  No   CCM required this visit?  No      Plan:     I have personally reviewed and noted the following in the patient's chart:   Medical and social history Use of alcohol, tobacco or illicit drugs  Current medications and supplements including opioid prescriptions. Patient is not currently taking opioid prescriptions. Functional ability and status Nutritional status Physical activity Advanced directives List of other physicians Hospitalizations, surgeries, and ER visits in previous 12 months Vitals Screenings to include cognitive, depression, and falls Referrals and appointments  In addition, I have reviewed and discussed with patient certain preventive protocols, quality metrics, and best practice recommendations. A written personalized care plan for preventive services as well as general preventive health recommendations were provided to patient.     Daphane Shepherd, LPN   [id number]   Nurse Notes: Due Tdap Vaccine

## 2022-12-20 NOTE — Patient Instructions (Signed)
Paul Murillo , Thank you for taking time to come for your Medicare Wellness Visit. I appreciate your ongoing commitment to your health goals. Please review the following plan we discussed and let me know if I can assist you in the future.   These are the goals we discussed:  Goals      AWV     11/24/2020 AWV Goal: Exercise for General Health  Patient will verbalize understanding of the benefits of increased physical activity: Exercising regularly is important. It will improve your overall fitness, flexibility, and endurance. Regular exercise also will improve your overall health. It can help you control your weight, reduce stress, and improve your bone density. Over the next year, patient will increase physical activity as tolerated with a goal of at least 150 minutes of moderate physical activity per week.  You can tell that you are exercising at a moderate intensity if your heart starts beating faster and you start breathing faster but can still hold a conversation. Moderate-intensity exercise ideas include: Walking 1 mile (1.6 km) in about 15 minutes Biking Hiking Golfing Dancing Water aerobics Patient will verbalize understanding of everyday activities that increase physical activity by providing examples like the following: Yard work, such as: Sales promotion account executive Gardening Washing windows or floors Patient will be able to explain general safety guidelines for exercising:  Before you start a new exercise program, talk with your health care provider. Do not exercise so much that you hurt yourself, feel dizzy, or get very short of breath. Wear comfortable clothes and wear shoes with good support. Drink plenty of water while you exercise to prevent dehydration or heat stroke. Work out until your breathing and your heartbeat get faster.         This is a list of the screening recommended for you and due  dates:  Health Maintenance  Topic Date Due   DTaP/Tdap/Td vaccine (5 - Td or Tdap) 01/05/2020   COVID-19 Vaccine (8 - 2023-24 season) 06/24/2022   Cologuard (Stool DNA test)  12/11/2023   Medicare Annual Wellness Visit  12/21/2023   Pneumonia Vaccine  Completed   Flu Shot  Completed   Hepatitis C Screening: USPSTF Recommendation to screen - Ages 18-79 yo.  Completed   Zoster (Shingles) Vaccine  Completed   HPV Vaccine  Aged Out   Colon Cancer Screening  Discontinued    Advanced directives: Advance directive discussed with you today. I have provided a copy for you to complete at home and have notarized. Once this is complete please bring a copy in to our office so we can scan it into your chart.   Conditions/risks identified: Aim for 30 minutes of exercise or brisk walking, 6-8 glasses of water, and 5 servings of fruits and vegetables each day.   Next appointment: Follow up in one year for your annual wellness visit.   Preventive Care 12 Years and Older, Male  Preventive care refers to lifestyle choices and visits with your health care provider that can promote health and wellness. What does preventive care include? A yearly physical exam. This is also called an annual well check. Dental exams once or twice a year. Routine eye exams. Ask your health care provider how often you should have your eyes checked. Personal lifestyle choices, including: Daily care of your teeth and gums. Regular physical activity. Eating a healthy diet. Avoiding tobacco and drug use. Limiting alcohol use. Practicing safe  sex. Taking low doses of aspirin every day. Taking vitamin and mineral supplements as recommended by your health care provider. What happens during an annual well check? The services and screenings done by your health care provider during your annual well check will depend on your age, overall health, lifestyle risk factors, and family history of disease. Counseling  Your health care  provider may ask you questions about your: Alcohol use. Tobacco use. Drug use. Emotional well-being. Home and relationship well-being. Sexual activity. Eating habits. History of falls. Memory and ability to understand (cognition). Work and work Statistician. Screening  You may have the following tests or measurements: Height, weight, and BMI. Blood pressure. Lipid and cholesterol levels. These may be checked every 5 years, or more frequently if you are over 15 years old. Skin check. Lung cancer screening. You may have this screening every year starting at age 43 if you have a 30-pack-year history of smoking and currently smoke or have quit within the past 15 years. Fecal occult blood test (FOBT) of the stool. You may have this test every year starting at age 55. Flexible sigmoidoscopy or colonoscopy. You may have a sigmoidoscopy every 5 years or a colonoscopy every 10 years starting at age 40. Prostate cancer screening. Recommendations will vary depending on your family history and other risks. Hepatitis C blood test. Hepatitis B blood test. Sexually transmitted disease (STD) testing. Diabetes screening. This is done by checking your blood sugar (glucose) after you have not eaten for a while (fasting). You may have this done every 1-3 years. Abdominal aortic aneurysm (AAA) screening. You may need this if you are a current or former smoker. Osteoporosis. You may be screened starting at age 44 if you are at high risk. Talk with your health care provider about your test results, treatment options, and if necessary, the need for more tests. Vaccines  Your health care provider may recommend certain vaccines, such as: Influenza vaccine. This is recommended every year. Tetanus, diphtheria, and acellular pertussis (Tdap, Td) vaccine. You may need a Td booster every 10 years. Zoster vaccine. You may need this after age 23. Pneumococcal 13-valent conjugate (PCV13) vaccine. One dose is  recommended after age 59. Pneumococcal polysaccharide (PPSV23) vaccine. One dose is recommended after age 57. Talk to your health care provider about which screenings and vaccines you need and how often you need them. This information is not intended to replace advice given to you by your health care provider. Make sure you discuss any questions you have with your health care provider. Document Released: 11/06/2015 Document Revised: 06/29/2016 Document Reviewed: 08/11/2015 Elsevier Interactive Patient Education  2017 [place] Prevention in the Home Falls can cause injuries. They can happen to people of all ages. There are many things you can do to make your home safe and to help prevent falls. What can I do on the outside of my home? Regularly fix the edges of walkways and driveways and fix any cracks. Remove anything that might make you trip as you walk through a door, such as a raised step or threshold. Trim any bushes or trees on the path to your home. Use bright outdoor lighting. Clear any walking paths of anything that might make someone trip, such as rocks or tools. Regularly check to see if handrails are loose or broken. Make sure that both sides of any steps have handrails. Any raised decks and porches should have guardrails on the edges. Have any leaves, snow, or ice cleared regularly.  Use sand or salt on walking paths during winter. Clean up any spills in your garage right away. This includes oil or grease spills. What can I do in the bathroom? Use night lights. Install grab bars by the toilet and in the tub and shower. Do not use towel bars as grab bars. Use non-skid mats or decals in the tub or shower. If you need to sit down in the shower, use a plastic, non-slip stool. Keep the floor dry. Clean up any water that spills on the floor as soon as it happens. Remove soap buildup in the tub or shower regularly. Attach bath mats securely with double-sided non-slip rug  tape. Do not have throw rugs and other things on the floor that can make you trip. What can I do in the bedroom? Use night lights. Make sure that you have a light by your bed that is easy to reach. Do not use any sheets or blankets that are too big for your bed. They should not hang down onto the floor. Have a firm chair that has side arms. You can use this for support while you get dressed. Do not have throw rugs and other things on the floor that can make you trip. What can I do in the kitchen? Clean up any spills right away. Avoid walking on wet floors. Keep items that you use a lot in easy-to-reach places. If you need to reach something above you, use a strong step stool that has a grab bar. Keep electrical cords out of the way. Do not use floor polish or wax that makes floors slippery. If you must use wax, use non-skid floor wax. Do not have throw rugs and other things on the floor that can make you trip. What can I do with my stairs? Do not leave any items on the stairs. Make sure that there are handrails on both sides of the stairs and use them. Fix handrails that are broken or loose. Make sure that handrails are as long as the stairways. Check any carpeting to make sure that it is firmly attached to the stairs. Fix any carpet that is loose or worn. Avoid having throw rugs at the top or bottom of the stairs. If you do have throw rugs, attach them to the floor with carpet tape. Make sure that you have a light switch at the top of the stairs and the bottom of the stairs. If you do not have them, ask someone to add them for you. What else can I do to help prevent falls? Wear shoes that: Do not have high heels. Have rubber bottoms. Are comfortable and fit you well. Are closed at the toe. Do not wear sandals. If you use a stepladder: Make sure that it is fully opened. Do not climb a closed stepladder. Make sure that both sides of the stepladder are locked into place. Ask someone to  hold it for you, if possible. Clearly mark and make sure that you can see: Any grab bars or handrails. First and last steps. Where the edge of each step is. Use tools that help you move around (mobility aids) if they are needed. These include: Canes. Walkers. Scooters. Crutches. Turn on the lights when you go into a dark area. Replace any light bulbs as soon as they burn out. Set up your furniture so you have a clear path. Avoid moving your furniture around. If any of your floors are uneven, fix them. If there are any pets around you,  be aware of where they are. Review your medicines with your doctor. Some medicines can make you feel dizzy. This can increase your chance of falling. Ask your doctor what other things that you can do to help prevent falls. This information is not intended to replace advice given to you by your health care provider. Make sure you discuss any questions you have with your health care provider. Document Released: 08/06/2009 Document Revised: 03/17/2016 Document Reviewed: 11/14/2014 Elsevier Interactive Patient Education  2017 [employer].

## 2022-12-24 ENCOUNTER — Other Ambulatory Visit: Payer: Self-pay | Admitting: Family

## 2022-12-28 DIAGNOSIS — Z471 Aftercare following joint replacement surgery: Secondary | ICD-10-CM | POA: Diagnosis not present

## 2022-12-28 DIAGNOSIS — Z96641 Presence of right artificial hip joint: Secondary | ICD-10-CM | POA: Diagnosis not present

## 2023-03-24 ENCOUNTER — Encounter: Payer: Self-pay | Admitting: Family

## 2023-03-24 ENCOUNTER — Ambulatory Visit (INDEPENDENT_AMBULATORY_CARE_PROVIDER_SITE_OTHER): Payer: Medicare Other | Admitting: Family

## 2023-03-24 VITALS — BP 135/77 | HR 66 | Temp 97.6°F | Wt 205.6 lb

## 2023-03-24 DIAGNOSIS — I10 Essential (primary) hypertension: Secondary | ICD-10-CM | POA: Diagnosis not present

## 2023-03-24 DIAGNOSIS — E782 Mixed hyperlipidemia: Secondary | ICD-10-CM

## 2023-03-24 DIAGNOSIS — K219 Gastro-esophageal reflux disease without esophagitis: Secondary | ICD-10-CM

## 2023-03-24 DIAGNOSIS — E559 Vitamin D deficiency, unspecified: Secondary | ICD-10-CM

## 2023-03-24 DIAGNOSIS — E7800 Pure hypercholesterolemia, unspecified: Secondary | ICD-10-CM

## 2023-03-24 DIAGNOSIS — I129 Hypertensive chronic kidney disease with stage 1 through stage 4 chronic kidney disease, or unspecified chronic kidney disease: Secondary | ICD-10-CM

## 2023-03-24 DIAGNOSIS — I2510 Atherosclerotic heart disease of native coronary artery without angina pectoris: Secondary | ICD-10-CM | POA: Diagnosis not present

## 2023-03-24 DIAGNOSIS — N401 Enlarged prostate with lower urinary tract symptoms: Secondary | ICD-10-CM | POA: Diagnosis not present

## 2023-03-24 DIAGNOSIS — Z9861 Coronary angioplasty status: Secondary | ICD-10-CM

## 2023-03-24 DIAGNOSIS — N1831 Chronic kidney disease, stage 3a: Secondary | ICD-10-CM | POA: Diagnosis not present

## 2023-03-24 DIAGNOSIS — B351 Tinea unguium: Secondary | ICD-10-CM | POA: Diagnosis not present

## 2023-03-24 DIAGNOSIS — E039 Hypothyroidism, unspecified: Secondary | ICD-10-CM | POA: Diagnosis not present

## 2023-03-24 DIAGNOSIS — R351 Nocturia: Secondary | ICD-10-CM

## 2023-03-24 MED ORDER — TERBINAFINE HCL 250 MG PO TABS: 250.0000 mg | ORAL_TABLET | Freq: Every day | ORAL | 0 refills | Status: DC

## 2023-03-24 NOTE — Progress Notes (Signed)
Subjective:    Patient ID: Paul Murillo, male    DOB: 1956-06-30, 67 y.o.   MRN: [id number]  Chief Complaint  Patient presents with   Medical Management of Chronic Issues   Pt presents to the office today for chronic follow up . Pt is followed by Urologists annually for Prostate Cancer. PT states he is followed by Cardiologists annually for CAD. He states he is doing well and retired 05/08/20.   He has CKD and limits NSAID's. Stable.  Hypertension This is a chronic problem. The current episode started more than 1 year ago. The problem has been resolved since onset. The problem is controlled. Pertinent negatives include no malaise/fatigue, peripheral edema or shortness of breath. Risk factors for coronary artery disease include dyslipidemia and male gender. The current treatment provides moderate improvement. Identifiable causes of hypertension include a thyroid problem.  Gastroesophageal Reflux He complains of belching and heartburn. He reports no hoarse voice. This is a chronic problem. The current episode started more than 1 year ago. The problem occurs occasionally. Pertinent negatives include no fatigue. He has tried a PPI for the symptoms. The treatment provided moderate relief.  Thyroid Problem Presents for follow-up visit. Patient reports no anxiety, constipation, fatigue or hoarse voice. The symptoms have been stable. His past medical history is significant for hyperlipidemia.  Benign Prostatic Hypertrophy This is a chronic problem. The current episode started more than 1 year ago. Irritative symptoms include nocturia (1). Past treatments include tamsulosin. The treatment provided moderate relief.  Hyperlipidemia This is a chronic problem. The current episode started more than 1 year ago. The problem is controlled. Recent lipid tests were reviewed and are normal. Pertinent negatives include no shortness of breath. Current antihyperlipidemic treatment includes statins. The current  treatment provides moderate improvement of lipids. Risk factors for coronary artery disease include dyslipidemia, hypertension, a sedentary lifestyle and male sex.      Review of Systems  Constitutional:  Negative for fatigue and malaise/fatigue.  HENT:  Negative for hoarse voice.   Respiratory:  Negative for shortness of breath.   Gastrointestinal:  Positive for heartburn. Negative for constipation.  Genitourinary:  Positive for nocturia (1).  Psychiatric/Behavioral:  The patient is not nervous/anxious.   All other systems reviewed and are negative.      Objective:   Physical Exam Vitals reviewed.  Constitutional:      General: He is not in acute distress.    Appearance: He is well-developed.  HENT:     Head: Normocephalic.     Right Ear: Tympanic membrane normal.     Left Ear: Tympanic membrane normal.  Eyes:     General:        Right eye: No discharge.        Left eye: No discharge.     Pupils: Pupils are equal, round, and reactive to light.  Neck:     Thyroid: No thyromegaly.  Cardiovascular:     Rate and Rhythm: Normal rate and regular rhythm.     Heart sounds: Normal heart sounds. No murmur heard. Pulmonary:     Effort: Pulmonary effort is normal. No respiratory distress.     Breath sounds: Normal breath sounds. No wheezing.  Abdominal:     General: Bowel sounds are normal. There is no distension.     Palpations: Abdomen is soft.     Tenderness: There is no abdominal tenderness.  Musculoskeletal:        General: No tenderness. Normal range of motion.  Cervical back: Normal range of motion and neck supple.  Skin:    General: Skin is warm and dry.     Findings: No erythema or rash.     Comments: Right great toenail thick and discolored. Partially attached   Neurological:     Mental Status: He is alert and oriented to person, place, and time.     Cranial Nerves: No cranial nerve deficit.     Deep Tendon Reflexes: Reflexes are normal and symmetric.   Psychiatric:        Behavior: Behavior normal.        Thought Content: Thought content normal.        Judgment: Judgment normal.       BP 135/77   Pulse 66   Temp 97.6 F (36.4 C) (Temporal)   Wt 205 lb 9.6 oz (93.3 kg)   SpO2 98%   BMI 27.13 kg/m      Assessment & Plan:  Paul Murillo comes in today with chief complaint of Medical Management of Chronic Issues   Diagnosis and orders addressed:  1. Benign prostatic hyperplasia with nocturia - CMP14+EGFR  2. Coronary artery disease involving native coronary artery of native heart without angina pectoris - CMP14+EGFR  3. Gastroesophageal reflux disease, unspecified whether esophagitis present - CMP14+EGFR  4. Primary hypertension - CMP14+EGFR  5. Mixed hyperlipidemia - CMP14+EGFR  6. Hypothyroidism, unspecified type  - CMP14+EGFR - TSH  7. Pure hypercholesterolemia - CMP14+EGFR  8. Stage 3a chronic kidney disease (HCC) - CMP14+EGFR  9. Vitamin D deficiency - CMP14+EGFR  10. CAD S/P percutaneous coronary angioplasty  - CMP14+EGFR  11. Onychomycosis Start Terbinafine  - terbinafine (LAMISIL) 250 MG tablet; Take 1 tablet (250 mg total) by mouth daily.  Dispense: 90 tablet; Refill: 0   Labs pending Health Maintenance reviewed Diet and exercise encouraged  Follow up plan: 6 months    Jannifer Rodney, FNP

## 2023-03-24 NOTE — Patient Instructions (Addendum)

## 2023-03-25 LAB — CMP14+EGFR
ALT: 24 IU/L (ref 0–44)
AST: 23 IU/L (ref 0–40)
Albumin/Globulin Ratio: 1.8 (ref 1.2–2.2)
Albumin: 4.6 g/dL (ref 3.9–4.9)
Alkaline Phosphatase: 93 IU/L (ref 44–121)
BUN/Creatinine Ratio: 9 — ABNORMAL LOW (ref 10–24)
BUN: 11 mg/dL (ref 8–27)
Bilirubin Total: 0.5 mg/dL (ref 0.0–1.2)
CO2: 23 mmol/L (ref 20–29)
Calcium: 9.7 mg/dL (ref 8.6–10.2)
Chloride: 104 mmol/L (ref 96–106)
Creatinine, Ser: 1.26 mg/dL (ref 0.76–1.27)
Globulin, Total: 2.6 g/dL (ref 1.5–4.5)
Glucose: 124 mg/dL — ABNORMAL HIGH (ref 70–99)
Potassium: 4.6 mmol/L (ref 3.5–5.2)
Sodium: 140 mmol/L (ref 134–144)
Total Protein: 7.2 g/dL (ref 6.0–8.5)
eGFR: 62 mL/min/{1.73_m2} (ref >59)

## 2023-03-25 LAB — TSH: TSH: 2.38 u[IU]/mL (ref 0.450–4.500)

## 2023-03-28 ENCOUNTER — Other Ambulatory Visit: Payer: Self-pay | Admitting: Family Medicine

## 2023-03-28 DIAGNOSIS — R739 Hyperglycemia, unspecified: Secondary | ICD-10-CM

## 2023-03-29 ENCOUNTER — Other Ambulatory Visit: Payer: Medicare Other

## 2023-03-29 DIAGNOSIS — R739 Hyperglycemia, unspecified: Secondary | ICD-10-CM | POA: Diagnosis not present

## 2023-03-29 LAB — BAYER DCA HB A1C WAIVED: HB A1C (BAYER DCA - WAIVED): 5.8 % — ABNORMAL HIGH (ref 4.8–5.6)

## 2023-03-30 ENCOUNTER — Other Ambulatory Visit: Payer: Self-pay | Admitting: Family

## 2023-03-30 DIAGNOSIS — R7303 Prediabetes: Secondary | ICD-10-CM | POA: Insufficient documentation

## 2023-04-22 ENCOUNTER — Other Ambulatory Visit: Payer: Self-pay | Admitting: Family

## 2023-04-22 DIAGNOSIS — E559 Vitamin D deficiency, unspecified: Secondary | ICD-10-CM

## 2023-06-28 ENCOUNTER — Other Ambulatory Visit: Payer: Self-pay | Admitting: Family

## 2023-06-28 DIAGNOSIS — K219 Gastro-esophageal reflux disease without esophagitis: Secondary | ICD-10-CM

## 2023-07-03 ENCOUNTER — Encounter: Payer: Self-pay | Admitting: Family Medicine

## 2023-07-03 ENCOUNTER — Ambulatory Visit (INDEPENDENT_AMBULATORY_CARE_PROVIDER_SITE_OTHER): Payer: Medicare Other

## 2023-07-03 ENCOUNTER — Ambulatory Visit (INDEPENDENT_AMBULATORY_CARE_PROVIDER_SITE_OTHER): Payer: Medicare Other | Admitting: Family Medicine

## 2023-07-03 VITALS — BP 133/75 | HR 54 | Ht 73.0 in | Wt 201.0 lb

## 2023-07-03 DIAGNOSIS — R053 Chronic cough: Secondary | ICD-10-CM | POA: Diagnosis not present

## 2023-07-03 MED ORDER — PREDNISONE 20 MG PO TABS
ORAL_TABLET | ORAL | 0 refills | Status: DC
Start: 2023-07-03 — End: 2023-08-18

## 2023-07-03 MED ORDER — FLUTICASONE PROPIONATE 50 MCG/ACT NA SUSP
1.0000 | Freq: Two times a day (BID) | NASAL | 1 refills | Status: AC | PRN
Start: 2023-07-03 — End: ?

## 2023-07-03 MED ORDER — BENZONATATE 100 MG PO CAPS
100.0000 mg | ORAL_CAPSULE | Freq: Two times a day (BID) | ORAL | 2 refills | Status: DC | PRN
Start: 2023-07-03 — End: 2023-08-18

## 2023-07-03 NOTE — Progress Notes (Signed)
BP 133/75   Pulse (!) 54   Ht 6\' 1"  (1.854 m)   Wt 201 lb (91.2 kg)   SpO2 98%   BMI 26.52 kg/m    Subjective:   Patient ID: Paul Murillo, male    DOB: [birth year], 67 y.o.   MRN: [id number]  HPI: Paul Murillo is a 67 y.o. male presenting on 07/03/2023 for Cough (Present x months)   HPI Cough for months Patient comes in today complaining of cough that been going on for about 3 months.  He says it is a clear congestion that comes up and comes and goes.  He says sometimes during the day and symptoms through the night and cannot pinpoint any specific time the day that it happens.  He has been using lozenges and Robitussin and Mucinex and they do not seem to be helping that much.  He does take a medicine for acid reflux but says it is working and denies any major issues with acid reflux currently.  He denies any fevers or chills or sick contacts that he knows of.  Times he will have posttussive emesis  Relevant past medical, surgical, family and social history reviewed and updated as indicated. Interim medical history since our last visit reviewed. Allergies and medications reviewed and updated.  Review of Systems  Constitutional:  Negative for chills and fever.  HENT:  Positive for congestion. Negative for mouth sores, sinus pressure, sinus pain and sore throat.   Eyes:  Negative for visual disturbance.  Respiratory:  Positive for cough. Negative for shortness of breath and wheezing.   Cardiovascular:  Negative for chest pain and leg swelling.  Skin:  Negative for rash.  All other systems reviewed and are negative.   Per HPI unless specifically indicated above   Allergies as of 07/03/2023   No Known Allergies      Medication List        Accurate as of July 03, 2023  2:40 PM. If you have any questions, ask your nurse or doctor.          alfuzosin 10 MG 24 hr tablet Commonly known as: UROXATRAL   aspirin EC 81 MG tablet Take 81 mg by mouth daily.   atorvastatin 80  MG tablet Commonly known as: LIPITOR TAKE 1 TABLET BY MOUTH EVERY DAY   benzonatate 100 MG capsule Commonly known as: Tessalon Perles Take 1 capsule (100 mg total) by mouth 2 (two) times daily as needed for cough. Started by: Elige Radon Neyla Gauntt   dapagliflozin propanediol 10 MG Tabs tablet Commonly known as: FARXIGA Take by mouth daily.   fluticasone 50 MCG/ACT nasal spray Commonly known as: FLONASE Place 1 spray into both nostrils 2 (two) times daily as needed for allergies or rhinitis. Started by: Elige Radon Eloni Darius   levothyroxine 75 MCG tablet Commonly known as: SYNTHROID TAKE 1 TABLET BY MOUTH EVERY DAY   metoprolol succinate 25 MG 24 hr tablet Commonly known as: TOPROL-XL Take 1 tablet (25 mg total) by mouth daily.   nitroGLYCERIN 0.4 MG SL tablet Commonly known as: NITROSTAT Place 1 tablet (0.4 mg total) under the tongue every 5 (five) minutes as needed for chest pain. Reported on 04/01/2016   omeprazole 20 MG capsule Commonly known as: PRILOSEC TAKE 1 CAPSULE BY MOUTH EVERY DAY   oxybutynin 10 MG 24 hr tablet Commonly known as: DITROPAN-XL Take 1 tablet by mouth daily.   predniSONE 20 MG tablet Commonly known as: DELTASONE 2 po at same time daily  for 5 days Started by: Elige Radon Presly Steinruck   tamsulosin 0.4 MG Caps capsule Commonly known as: FLOMAX TAKE 1 CAPSULE BY MOUTH EVERY DAY   telmisartan 80 MG tablet Commonly known as: MICARDIS Take 1 tablet (80 mg total) by mouth daily.   terbinafine 250 MG tablet Commonly known as: LAMISIL Take 1 tablet (250 mg total) by mouth daily.   vardenafil 20 MG tablet Commonly known as: LEVITRA Take 1 tablet (20 mg total) by mouth daily as needed for erectile dysfunction.   Vitamin D (Ergocalciferol) 1.25 MG (50000 UNIT) Caps capsule Commonly known as: DRISDOL TAKE 1 CAPSULE (50,000 UNITS TOTAL) BY MOUTH EVERY 7 (SEVEN) DAYS         Objective:   BP 133/75   Pulse (!) 54   Ht 6\' 1"  (1.854 m)   Wt 201 lb  (91.2 kg)   SpO2 98%   BMI 26.52 kg/m   Wt Readings from Last 3 Encounters:  07/03/23 201 lb (91.2 kg)  03/24/23 205 lb 9.6 oz (93.3 kg)  12/20/22 203 lb (92.1 kg)    Physical Exam Vitals and nursing note reviewed.  Constitutional:      General: He is not in acute distress.    Appearance: He is well-developed. He is not diaphoretic.  HENT:     Mouth/Throat:     Mouth: Mucous membranes are moist.     Pharynx: Oropharynx is clear. No oropharyngeal exudate or posterior oropharyngeal erythema.  Eyes:     General: No scleral icterus.    Conjunctiva/sclera: Conjunctivae normal.  Neck:     Thyroid: No thyromegaly.  Cardiovascular:     Rate and Rhythm: Normal rate and regular rhythm.     Heart sounds: Normal heart sounds. No murmur heard. Pulmonary:     Effort: Pulmonary effort is normal. No respiratory distress.     Breath sounds: Normal breath sounds. No wheezing.  Musculoskeletal:        General: Normal range of motion.     Cervical back: Neck supple.  Lymphadenopathy:     Cervical: No cervical adenopathy.  Skin:    General: Skin is warm and dry.     Findings: No rash.  Neurological:     Mental Status: He is alert and oriented to person, place, and time.     Coordination: Coordination normal.  Psychiatric:        Behavior: Behavior normal.    Cxr: pending radiology read   Assessment & Plan:   Problem List Items Addressed This Visit   None Visit Diagnoses     Persistent cough    -  Primary   Relevant Medications   predniSONE (DELTASONE) 20 MG tablet   benzonatate (TESSALON PERLES) 100 MG capsule   fluticasone (FLONASE) 50 MCG/ACT nasal spray   Other Relevant Orders   DG Chest 2 View     Will do prednisone and Tessalon Perles and Flonase and also get a chest x-ray on the way out, we can refer  Follow up plan: Return if symptoms worsen or fail to improve.  Counseling provided for all of the vaccine components Orders Placed This Encounter  Procedures   DG  Chest 2 View    Arville Care, MD [facility] 07/03/2023, 2:40 PM

## 2023-07-19 ENCOUNTER — Other Ambulatory Visit: Payer: Self-pay | Admitting: Family

## 2023-07-20 ENCOUNTER — Other Ambulatory Visit: Payer: Self-pay | Admitting: Family

## 2023-07-25 ENCOUNTER — Other Ambulatory Visit: Payer: Self-pay | Admitting: Family Medicine

## 2023-07-25 DIAGNOSIS — R053 Chronic cough: Secondary | ICD-10-CM

## 2023-08-03 ENCOUNTER — Other Ambulatory Visit: Payer: Self-pay

## 2023-08-03 DIAGNOSIS — R7303 Prediabetes: Secondary | ICD-10-CM

## 2023-08-18 ENCOUNTER — Ambulatory Visit (INDEPENDENT_AMBULATORY_CARE_PROVIDER_SITE_OTHER): Payer: Medicare Other | Admitting: Family Medicine

## 2023-08-18 ENCOUNTER — Encounter: Payer: Self-pay | Admitting: Family Medicine

## 2023-08-18 VITALS — BP 136/80 | HR 45 | Temp 97.6°F | Ht 73.0 in | Wt 202.2 lb

## 2023-08-18 DIAGNOSIS — R053 Chronic cough: Secondary | ICD-10-CM | POA: Diagnosis not present

## 2023-08-18 DIAGNOSIS — R0982 Postnasal drip: Secondary | ICD-10-CM

## 2023-08-18 MED ORDER — CETIRIZINE HCL 10 MG PO TABS
10.0000 mg | ORAL_TABLET | Freq: Every day | ORAL | 2 refills | Status: DC
Start: 2023-08-18 — End: 2023-11-13

## 2023-08-18 MED ORDER — ALBUTEROL SULFATE HFA 108 (90 BASE) MCG/ACT IN AERS
2.0000 | INHALATION_SPRAY | Freq: Four times a day (QID) | RESPIRATORY_TRACT | 2 refills | Status: DC | PRN
Start: 2023-08-18 — End: 2023-11-03

## 2023-08-18 NOTE — Progress Notes (Signed)
Subjective:  Patient ID: Paul Murillo, male    DOB: 05/25/1956, 68 y.o.   MRN: [id number]  Patient Care Team: Junie Spencer, FNP as PCP - General (Family Medicine) Rene Paci, MD as Consulting Physician (Urology) Gayla Doss, MD as Referring Physician (Cardiology)   Chief Complaint:  Cough (X 1 1/2 months )   HPI: Paul Murillo is a 67 y.o. male presenting on 08/18/2023 for Cough (X 1 1/2 months )   Discussed the use of AI scribe software for clinical note transcription with the patient, who gave verbal consent to proceed.  History of Present Illness   The patient, known to have reflux and a history of heart disease, presents with a persistent cough that has not improved over the past month and a half. The cough is described as a sensation of something dropping in the throat, leading to gagging and sometimes vomiting, especially after eating. The patient denies any associated sinus problems or other symptoms.  Despite long-term use of omeprazole for reflux, the cough persists. The patient has also been treated with sinus medication and Flonase nasal spray for the past month and a half, with no improvement. The patient tried Claritin for a short period last week, but did not notice any benefit.  The cough is present both during the day and at night, and is not associated with chest pain, shortness of breath, or throat pain, except during coughing episodes. The patient has been using Flonase daily for the past month and a half. The patient has not yet started using the four bottles of Flonase that were previously prescribed.          Relevant past medical, surgical, family, and social history reviewed and updated as indicated.  Allergies and medications reviewed and updated. Data reviewed: Chart in Epic.   Past Medical History:  Diagnosis Date   Cancer [facility])    Prostate   Coronary artery disease    ED (erectile dysfunction)    Genitourinary disorder     Headache    Hypercholesteremia    Hypertension    MI (myocardial infarction) (HCC)    Reflux    Syncope    Vitamin D deficiency     Past Surgical History:  Procedure Laterality Date   APPENDECTOMY     HERNIA REPAIR     LEFT HEART CATHETERIZATION WITH CORONARY ANGIOGRAM N/A 01/29/2014   Procedure: LEFT HEART CATHETERIZATION WITH CORONARY ANGIOGRAM;  Surgeon: Peter M Swaziland, MD;  Location: [facility] CATH LAB;  Service: Cardiovascular;  Laterality: N/A;   TOTAL HIP ARTHROPLASTY      Social History   Socioeconomic History   Marital status: Married    Spouse name: Paul Murillo   Number of children: 2   Years of education: Not on file   Highest education level: 12th grade  Occupational History   Occupation: Retired  Tobacco Use   Smoking status: Former    Current packs/day: 0.00    Average packs/day: 0.3 packs/day for 50.0 years (12.5 ttl pk-yrs)    Types: Cigarettes    Start date: 12/31/1971    Quit date: 12/30/2021    Years since quitting: 1.6   Smokeless tobacco: Never   Tobacco comments:    Quit 2021 - started back  Vaping Use   Vaping status: Never Used  Substance and Sexual Activity   Alcohol use: Yes    Alcohol/week: 0.0 standard drinks of alcohol    Comment: occ.   Drug use: No  Sexual activity: Yes  Other Topics Concern   Not on file  Social History Narrative   Children live in [place]   Social Determinants of Health   Financial Resource Strain: Low Risk  (12/20/2022)   Overall Financial Resource Strain (CARDIA)    Difficulty of Paying Living Expenses: Not hard at all  Food Insecurity: No Food Insecurity (12/20/2022)   Hunger Vital Sign    Worried About Running Out of Food in the Last Year: Never true    Ran Out of Food in the Last Year: Never true  Transportation Needs: No Transportation Needs (12/20/2022)   PRAPARE - Administrator, Civil Service (Medical): No    Lack of Transportation (Non-Medical): No  Physical Activity: Insufficiently Active (12/20/2022)    Exercise Vital Sign    Days of Exercise per Week: 3 days    Minutes of Exercise per Session: 30 min  Stress: No Stress Concern Present (12/20/2022)   [employer] of Occupational Health - Occupational Stress Questionnaire    Feeling of Stress : Not at all  Social Connections: Moderately Integrated (12/20/2022)   Social Connection and Isolation Panel [NHANES]    Frequency of Communication with Friends and Family: More than three times a week    Frequency of Social Gatherings with Friends and Family: More than three times a week    Attends Religious Services: More than 4 times per year    Active Member of [employer] or Organizations: No    Attends Banker Meetings: Never    Marital Status: Married  Catering manager Violence: Not At Risk (12/20/2022)   Humiliation, Afraid, Rape, and Kick questionnaire    Fear of Current or Ex-Partner: No    Emotionally Abused: No    Physically Abused: No    Sexually Abused: No    Outpatient Encounter Medications as of 08/18/2023  Medication Sig   albuterol (VENTOLIN HFA) 108 (90 Base) MCG/ACT inhaler Inhale 2 puffs into the lungs every 6 (six) hours as needed (cough).   alfuzosin (UROXATRAL) 10 MG 24 hr tablet    aspirin 81 MG EC tablet Take 81 mg by mouth daily.   atorvastatin (LIPITOR) 80 MG tablet TAKE 1 TABLET BY MOUTH EVERY DAY   cetirizine (ZYRTEC ALLERGY) 10 MG tablet Take 1 tablet (10 mg total) by mouth at bedtime.   dapagliflozin propanediol (FARXIGA) 10 MG TABS tablet Take by mouth daily.   fluticasone (FLONASE) 50 MCG/ACT nasal spray PLACE 1 SPRAY INTO BOTH NOSTRILS 2 (TWO) TIMES DAILY AS NEEDED FOR ALLERGIES OR RHINITIS.   levothyroxine (SYNTHROID) 75 MCG tablet TAKE 1 TABLET BY MOUTH EVERY DAY   metoprolol succinate (TOPROL-XL) 25 MG 24 hr tablet Take 1 tablet (25 mg total) by mouth daily.   nitroGLYCERIN (NITROSTAT) 0.4 MG SL tablet Place 1 tablet (0.4 mg total) under the tongue every 5 (five) minutes as needed for chest pain.  Reported on 04/01/2016   omeprazole (PRILOSEC) 20 MG capsule TAKE 1 CAPSULE BY MOUTH EVERY DAY   oxybutynin (DITROPAN-XL) 10 MG 24 hr tablet Take 1 tablet by mouth daily.   tamsulosin (FLOMAX) 0.4 MG CAPS capsule TAKE 1 CAPSULE BY MOUTH EVERY DAY   telmisartan (MICARDIS) 80 MG tablet Take 1 tablet (80 mg total) by mouth daily.   terbinafine (LAMISIL) 250 MG tablet Take 1 tablet (250 mg total) by mouth daily.   vardenafil (LEVITRA) 20 MG tablet Take 1 tablet (20 mg total) by mouth daily as needed for erectile dysfunction.  Vitamin D, Ergocalciferol, (DRISDOL) 1.25 MG (50000 UNIT) CAPS capsule TAKE 1 CAPSULE (50,000 UNITS TOTAL) BY MOUTH EVERY 7 (SEVEN) DAYS   [DISCONTINUED] benzonatate (TESSALON PERLES) 100 MG capsule Take 1 capsule (100 mg total) by mouth 2 (two) times daily as needed for cough.   [DISCONTINUED] predniSONE (DELTASONE) 20 MG tablet 2 po at same time daily for 5 days   No facility-administered encounter medications on file as of 08/18/2023.    No Known Allergies  Pertinent ROS per HPI, otherwise unremarkable      Objective:  BP 136/80   Pulse (!) 45   Temp 97.6 F (36.4 C) (Temporal)   Ht 6\' 1"  (1.854 m)   Wt 202 lb 3.2 oz (91.7 kg)   SpO2 98%   BMI 26.68 kg/m    Wt Readings from Last 3 Encounters:  08/18/23 202 lb 3.2 oz (91.7 kg)  07/03/23 201 lb (91.2 kg)  03/24/23 205 lb 9.6 oz (93.3 kg)    Physical Exam Vitals and nursing note reviewed.  Constitutional:      General: He is not in acute distress.    Appearance: Normal appearance. He is not ill-appearing, toxic-appearing or diaphoretic.  HENT:     Head: Normocephalic and atraumatic.     Right Ear: Tympanic membrane, ear canal and external ear normal.     Left Ear: Tympanic membrane, ear canal and external ear normal.     Nose: Nose normal.     Mouth/Throat:     Mouth: Mucous membranes are moist.     Pharynx: Oropharynx is clear. Postnasal drip present.  Eyes:     Conjunctiva/sclera: Conjunctivae  normal.     Pupils: Pupils are equal, round, and reactive to light.  Cardiovascular:     Rate and Rhythm: Normal rate and regular rhythm.  Pulmonary:     Effort: Pulmonary effort is normal.     Breath sounds: Normal breath sounds.  Musculoskeletal:     Cervical back: Normal range of motion and neck supple.  Lymphadenopathy:     Cervical: No cervical adenopathy.  Skin:    General: Skin is warm and dry.     Capillary Refill: Capillary refill takes less than 2 seconds.  Neurological:     General: No focal deficit present.     Mental Status: He is alert and oriented to person, place, and time.  Psychiatric:        Mood and Affect: Mood normal.        Behavior: Behavior normal.        Thought Content: Thought content normal.        Judgment: Judgment normal.    Physical Exam   HEENT: Postnasal drip observed. CHEST: Lungs clear to auscultation.        Results for orders placed or performed in visit on 03/29/23  Bayer DCA Hb A1c Waived  Result Value Ref Range   HB A1C (BAYER DCA - WAIVED) 5.8 (H) 4.8 - 5.6 %       Pertinent labs & imaging results that were available during my care of the patient were reviewed by me and considered in my medical decision making.  Assessment & Plan:  Tamika was seen today for cough.  Diagnoses and all orders for this visit:  Persistent cough -     cetirizine (ZYRTEC ALLERGY) 10 MG tablet; Take 1 tablet (10 mg total) by mouth at bedtime. -     albuterol (VENTOLIN HFA) 108 (90 Base) MCG/ACT inhaler; Inhale 2 puffs  into the lungs every 6 (six) hours as needed (cough).  Post-nasal drip -     cetirizine (ZYRTEC ALLERGY) 10 MG tablet; Take 1 tablet (10 mg total) by mouth at bedtime.     Assessment and Plan    Chronic Cough Likely secondary to postnasal drip. No associated chest pain or shortness of breath. Lungs clear on auscultation. -Start Zyrtec nightly for two weeks. -If no improvement, consider adding Singulair. -As needed, use  Albuterol inhaler for acute coughing spells (up to four times a day). -Consider use of local honey to build immunity to local pollen.  Gastroesophageal Reflux Disease Long-term use of Omeprazole. -Continue Omeprazole as prescribed.  Follow-up If no improvement in cough after two weeks of Zyrtec, patient to notify clinic.          Continue all other maintenance medications.  Follow up plan: Return if symptoms worsen or fail to improve.   Continue healthy lifestyle choices, including diet (rich in fruits, vegetables, and lean proteins, and low in salt and simple carbohydrates) and exercise (at least 30 minutes of moderate physical activity daily).  Educational handout given for postnasal drip  The above assessment and management plan was discussed with the patient. The patient verbalized understanding of and has agreed to the management plan. Patient is aware to call the clinic if they develop any new symptoms or if symptoms persist or worsen. Patient is aware when to return to the clinic for a follow-up visit. Patient educated on when it is appropriate to go to the emergency department.   Kari Baars, FNP-C [facility] [phone]

## 2023-09-04 ENCOUNTER — Telehealth: Payer: Self-pay | Admitting: Family Medicine

## 2023-09-04 DIAGNOSIS — R052 Subacute cough: Secondary | ICD-10-CM

## 2023-09-04 NOTE — Telephone Encounter (Signed)
Referral to Pulmonologist Prescription sent to pharmacy

## 2023-09-04 NOTE — Telephone Encounter (Signed)
Copied from CRM [id number]. Topic: Clinical - Medical Advice >> Sep 04, 2023 11:03 AM Desma Mcgregor wrote: Reason for CRM: Pt still suffering from bad cough. Been almost 2 months and meds are not helping. Needs to speak to Dr about other options. Cb# [phone]

## 2023-09-04 NOTE — Telephone Encounter (Signed)
Patient aware and verbalized understanding. DO NOT SEE WHERE YOU SENT IN MED.

## 2023-09-05 MED ORDER — BENZONATATE 200 MG PO CAPS
200.0000 mg | ORAL_CAPSULE | Freq: Three times a day (TID) | ORAL | 1 refills | Status: DC | PRN
Start: 2023-09-05 — End: 2023-09-11

## 2023-09-05 NOTE — Telephone Encounter (Signed)
Referral Pulmonologist pending, tessalon Prescription sent to pharmacy for cough.

## 2023-09-05 NOTE — Addendum Note (Signed)
Addended by: Jannifer Rodney A on: 09/05/2023 01:48 PM   Modules accepted: Orders

## 2023-09-07 ENCOUNTER — Telehealth: Payer: Self-pay | Admitting: Family Medicine

## 2023-09-07 NOTE — Telephone Encounter (Signed)
Please make him an appointment with me.

## 2023-09-07 NOTE — Telephone Encounter (Signed)
Copied from CRM [id number]. Topic: Clinical - Medical Advice >> Sep 07, 2023 12:45 PM Dimitri Ped wrote: Reason for CRM: patient is calling cause Dr Neysa Bonito was scheduling a appointment for a specialist but when patient spoke with specialist they didn't have anything till Jan. 21st and patient says he can't wait that long . Call back number [phone] patient is needing to speak with doctor to see about a earlier appointment

## 2023-09-07 NOTE — Telephone Encounter (Signed)
Called patient scheduled him in office tomorrow

## 2023-09-08 ENCOUNTER — Encounter: Payer: Self-pay | Admitting: Family

## 2023-09-08 ENCOUNTER — Other Ambulatory Visit: Payer: Self-pay | Admitting: Family

## 2023-09-08 ENCOUNTER — Ambulatory Visit (INDEPENDENT_AMBULATORY_CARE_PROVIDER_SITE_OTHER): Payer: Medicare Other | Admitting: Family

## 2023-09-08 VITALS — BP 135/82 | HR 60 | Wt 208.0 lb

## 2023-09-08 DIAGNOSIS — R7303 Prediabetes: Secondary | ICD-10-CM | POA: Diagnosis not present

## 2023-09-08 DIAGNOSIS — Z87891 Personal history of nicotine dependence: Secondary | ICD-10-CM

## 2023-09-08 DIAGNOSIS — R052 Subacute cough: Secondary | ICD-10-CM

## 2023-09-08 DIAGNOSIS — J208 Acute bronchitis due to other specified organisms: Secondary | ICD-10-CM | POA: Diagnosis not present

## 2023-09-08 DIAGNOSIS — B9689 Other specified bacterial agents as the cause of diseases classified elsewhere: Secondary | ICD-10-CM | POA: Diagnosis not present

## 2023-09-08 MED ORDER — PREDNISONE 10 MG (21) PO TBPK: ORAL_TABLET | ORAL | 0 refills | Status: DC

## 2023-09-08 MED ORDER — BREZTRI AEROSPHERE 160-9-4.8 MCG/ACT IN AERO: 2.0000 | INHALATION_SPRAY | Freq: Two times a day (BID) | RESPIRATORY_TRACT | Status: DC

## 2023-09-08 MED ORDER — DOXYCYCLINE HYCLATE 100 MG PO TABS: 100.0000 mg | ORAL_TABLET | Freq: Two times a day (BID) | ORAL | 0 refills | Status: DC

## 2023-09-08 NOTE — Progress Notes (Signed)
Subjective:    Patient ID: Paul Murillo, male    DOB: [birth year], 67 y.o.   MRN: [id number]  Chief Complaint  Patient presents with   Cough    2 mths    PT presents to the office today cough for the last two months. He has been seen on 07/03/23 and was given prednisone, tessalon, and Flonase. He had negative chest x-ray that was negative.   He was then seen on 08/18/23 and given zyrtec and albuterol.   States he continues to be coughing up sputum.   He has a referral to Pulmonologist pending but could not be seen until January.   Pt is prediabetic. This is stable.  Cough This is a new problem. The current episode started more than 1 month ago. The problem has been gradually worsening. The cough is Productive of purulent sputum. Associated symptoms include headaches, postnasal drip, shortness of breath and wheezing. Pertinent negatives include no chills, ear congestion, ear pain, fever, myalgias or nasal congestion. Risk factors: hx smoking. He has tried OTC cough suppressant, rest and OTC inhaler for the symptoms. The treatment provided moderate relief.      Review of Systems  Constitutional:  Negative for chills and fever.  HENT:  Positive for postnasal drip. Negative for ear pain.   Respiratory:  Positive for cough, shortness of breath and wheezing.   Musculoskeletal:  Negative for myalgias.  Neurological:  Positive for headaches.  All other systems reviewed and are negative.      Objective:   Physical Exam Vitals reviewed.  Constitutional:      General: He is not in acute distress.    Appearance: He is well-developed.  HENT:     Head: Normocephalic.  Eyes:     General:        Right eye: No discharge.        Left eye: No discharge.     Pupils: Pupils are equal, round, and reactive to light.  Neck:     Thyroid: No thyromegaly.  Cardiovascular:     Rate and Rhythm: Normal rate and regular rhythm.     Heart sounds: Normal heart sounds. No murmur heard. Pulmonary:      Effort: Pulmonary effort is normal. No respiratory distress.     Breath sounds: Normal breath sounds. No wheezing.     Comments: Coarse cough Abdominal:     General: Bowel sounds are normal. There is no distension.     Palpations: Abdomen is soft.     Tenderness: There is no abdominal tenderness.  Musculoskeletal:        General: No tenderness. Normal range of motion.     Cervical back: Normal range of motion and neck supple.  Skin:    General: Skin is warm and dry.     Findings: No erythema or rash.  Neurological:     Mental Status: He is alert and oriented to person, place, and time.     Cranial Nerves: No cranial nerve deficit.     Deep Tendon Reflexes: Reflexes are normal and symmetric.  Psychiatric:        Behavior: Behavior normal.        Thought Content: Thought content normal.        Judgment: Judgment normal.       BP 135/82   Pulse 60   Wt 208 lb (94.3 kg)   SpO2 96%   BMI 27.44 kg/m      Assessment & Plan:  Paul Murillo comes  in today with chief complaint of Cough (2 mths )   Diagnosis and orders addressed:  1. Subacute cough - CBC with Differential/Platelet - CMP14+EGFR - CT CHEST WO CONTRAST; Future - Budeson-Glycopyrrol-Formoterol (BREZTRI AEROSPHERE) 160-9-4.8 MCG/ACT AERO; Inhale 2 puffs into the lungs 2 (two) times daily.  2. Hx of smoking - CBC with Differential/Platelet - CMP14+EGFR - Budeson-Glycopyrrol-Formoterol (BREZTRI AEROSPHERE) 160-9-4.8 MCG/ACT AERO; Inhale 2 puffs into the lungs 2 (two) times daily.  3. Prediabetes - Microalbumin / creatinine urine ratio - CBC with Differential/Platelet - CMP14+EGFR  4. Acute bacterial bronchitis - doxycycline (VIBRA-TABS) 100 MG tablet; Take 1 tablet (100 mg total) by mouth 2 (two) times daily.  Dispense: 20 tablet; Refill: 0 - predniSONE (STERAPRED UNI-PAK 21 TAB) 10 MG (21) TBPK tablet; Use as directed  Dispense: 21 tablet; Refill: 0 - Budeson-Glycopyrrol-Formoterol (BREZTRI AEROSPHERE)  160-9-4.8 MCG/ACT AERO; Inhale 2 puffs into the lungs 2 (two) times daily.   Labs pending Given productive worsening cough will give doxycyline and prednisone  Start Breztri BID  CT pending  Follow up 3 months   Jannifer Rodney, FNP

## 2023-09-08 NOTE — Telephone Encounter (Signed)
I spoke with [facility] - I was able to get Patient an Appt for 10/09/2023. R/C to Patient - he is aware of Appt information :)

## 2023-09-08 NOTE — Patient Instructions (Signed)

## 2023-09-09 LAB — CBC WITH DIFFERENTIAL/PLATELET
Basophils Absolute: 0 10*3/uL (ref 0.0–0.2)
Basos: 0 %
EOS (ABSOLUTE): 0.9 10*3/uL — ABNORMAL HIGH (ref 0.0–0.4)
Eos: 11 %
Hematocrit: 40.8 % (ref 37.5–51.0)
Hemoglobin: 13.2 g/dL (ref 13.0–17.7)
Immature Grans (Abs): 0 10*3/uL (ref 0.0–0.1)
Immature Granulocytes: 0 %
Lymphocytes Absolute: 2.3 10*3/uL (ref 0.7–3.1)
Lymphs: 29 %
MCH: 30.3 pg (ref 26.6–33.0)
MCHC: 32.4 g/dL (ref 31.5–35.7)
MCV: 94 fL (ref 79–97)
Monocytes Absolute: 0.7 10*3/uL (ref 0.1–0.9)
Monocytes: 8 %
Neutrophils Absolute: 4 10*3/uL (ref 1.4–7.0)
Neutrophils: 52 %
Platelets: 243 10*3/uL (ref 150–450)
RBC: 4.36 x10E6/uL (ref 4.14–5.80)
RDW: 13.7 % (ref 11.6–15.4)
WBC: 7.9 10*3/uL (ref 3.4–10.8)

## 2023-09-09 LAB — MICROALBUMIN / CREATININE URINE RATIO
Creatinine, Urine: 62.5 mg/dL
Microalb/Creat Ratio: <5 mg/g{creat} (ref 0–29)
Microalbumin, Urine: <3 ug/mL

## 2023-09-09 LAB — CMP14+EGFR
ALT: 21 IU/L (ref 0–44)
AST: 29 IU/L (ref 0–40)
Albumin: 4.3 g/dL (ref 3.9–4.9)
Alkaline Phosphatase: 95 [IU]/L (ref 44–121)
BUN/Creatinine Ratio: 9 — ABNORMAL LOW (ref 10–24)
BUN: 11 mg/dL (ref 8–27)
Bilirubin Total: 0.6 mg/dL (ref 0.0–1.2)
CO2: 21 mmol/L (ref 20–29)
Calcium: 9.6 mg/dL (ref 8.6–10.2)
Chloride: 100 mmol/L (ref 96–106)
Creatinine, Ser: 1.23 mg/dL (ref 0.76–1.27)
Globulin, Total: 2.6 g/dL (ref 1.5–4.5)
Glucose: 103 mg/dL — ABNORMAL HIGH (ref 70–99)
Potassium: 4.3 mmol/L (ref 3.5–5.2)
Sodium: 139 mmol/L (ref 134–144)
Total Protein: 6.9 g/dL (ref 6.0–8.5)
eGFR: 64 mL/min/{1.73_m2} (ref >59)

## 2023-09-11 ENCOUNTER — Ambulatory Visit: Payer: Medicare Other

## 2023-09-11 ENCOUNTER — Encounter: Payer: Self-pay | Admitting: Nurse Practitioner

## 2023-09-11 ENCOUNTER — Ambulatory Visit (INDEPENDENT_AMBULATORY_CARE_PROVIDER_SITE_OTHER): Payer: Medicare Other

## 2023-09-11 ENCOUNTER — Ambulatory Visit (INDEPENDENT_AMBULATORY_CARE_PROVIDER_SITE_OTHER): Payer: Medicare Other | Admitting: Nurse Practitioner

## 2023-09-11 VITALS — BP 131/77 | HR 62 | Temp 98.3°F | Ht 73.0 in | Wt 207.6 lb

## 2023-09-11 DIAGNOSIS — R0989 Other specified symptoms and signs involving the circulatory and respiratory systems: Secondary | ICD-10-CM

## 2023-09-11 DIAGNOSIS — R7303 Prediabetes: Secondary | ICD-10-CM

## 2023-09-11 DIAGNOSIS — R042 Hemoptysis: Secondary | ICD-10-CM

## 2023-09-11 DIAGNOSIS — R059 Cough, unspecified: Secondary | ICD-10-CM | POA: Diagnosis not present

## 2023-09-11 DIAGNOSIS — R0602 Shortness of breath: Secondary | ICD-10-CM | POA: Diagnosis not present

## 2023-09-11 LAB — HM DIABETES EYE EXAM

## 2023-09-11 MED ORDER — AZITHROMYCIN 250 MG PO TABS: ORAL_TABLET | ORAL | 0 refills | Status: DC

## 2023-09-11 MED ORDER — BENZONATATE 100 MG PO CAPS
100.0000 mg | ORAL_CAPSULE | Freq: Three times a day (TID) | ORAL | 0 refills | Status: DC | PRN
Start: 2023-09-11 — End: 2023-10-16

## 2023-09-11 NOTE — Progress Notes (Signed)
Subjective:    Patient ID: Paul Murillo, male    DOB: [birth year], 67 y.o.   MRN: [id number]  Chief Complaint: Cough (Coughing up blood ) and Nasal Congestion   Cough This is a new problem. The current episode started in the past 7 days. The problem has been waxing and waning. The problem occurs hourly. The cough is Productive of sputum. Pertinent negatives include no ear congestion, fever, rhinorrhea or shortness of breath. Nothing aggravates the symptoms. The treatment provided mild relief. His past medical history is significant for COPD.    Patient Active Problem List   Diagnosis Date Noted   Prediabetes 03/30/2023   Stage 3a chronic kidney disease (HCC) 01/12/2022   Hypothyroidism 02/01/2021   H/O malignant neoplasm of prostate 03/24/2020   Old myocardial infarction 07/14/2019   BPH (benign prostatic hyperplasia) 05/14/2018   Coronary artery disease involving native coronary artery of native heart without angina pectoris 02/11/2016   H/O heart artery stent 02/11/2016   Esophageal reflux 08/25/2015   CAD S/P percutaneous coronary angioplasty 02/07/2014   Hyperlipemia 01/13/2011   HTN (hypertension) 01/13/2011   Hx of smoking 01/13/2011   Vitamin D deficiency 01/13/2011   ED (erectile dysfunction) 01/13/2011   Pure hypercholesterolemia 01/13/2011   SYNCOPE 12/14/2010       Review of Systems  Constitutional:  Negative for fever.  HENT:  Negative for rhinorrhea.   Respiratory:  Positive for cough. Negative for shortness of breath.        Objective:   Physical Exam Vitals reviewed.  Constitutional:      Appearance: Normal appearance.  Cardiovascular:     Rate and Rhythm: Normal rate and regular rhythm.     Heart sounds: Normal heart sounds.  Pulmonary:     Effort: Pulmonary effort is normal.     Breath sounds: Rales (exp) present.  Skin:    General: Skin is warm.  Neurological:     General: No focal deficit present.     Mental Status: He is alert and oriented  to person, place, and time.  Psychiatric:        Mood and Affect: Mood normal.        Behavior: Behavior normal.     BP 131/77   Pulse 62   Temp 98.3 F (36.8 C)   Ht 6\' 1"  (1.854 m)   Wt 207 lb 9.6 oz (94.2 kg)   SpO2 95%   BMI 27.39 kg/m   Chest xray- possible left lower lobe infiltrate-Preliminary reading by Paulene Floor, FNP  [facility]      Assessment & Plan:  Beather Arbour in today with chief complaint of Cough (Coughing up blood ) and Nasal Congestion   1. Chest congestion - DG Chest 2 View; Future - BMP8+EGFR  2. Coughing up blood 1. Take meds as prescribed 2. Use a cool mist humidifier especially during the winter months and when heat has been humid. 3. Use saline nose sprays frequently 4. Saline irrigations of the nose can be very helpful if done frequently.  * 4X daily for 1 week*  * Use of a nettie pot can be helpful with this. Follow directions with this* 5. Drink plenty of fluids 6. Keep thermostat turn down low 7.For any cough or congestion- tessalon perles 8. For fever or aces or pains- take tylenol or ibuprofen appropriate for age and weight.  * for fevers greater than 101 orally you may alternate ibuprofen and tylenol every  3 hours.    - DG  Chest 2 View; Future - BMP8+EGFR  Meds ordered this encounter  Medications   azithromycin (ZITHROMAX Z-PAK) 250 MG tablet    Sig: As directed    Dispense:  6 tablet    Refill:  0    Order Specific Question:   Supervising Provider    Answer:   Arville Care A [[id number]]   benzonatate (TESSALON PERLES) 100 MG capsule    Sig: Take 1 capsule (100 mg total) by mouth 3 (three) times daily as needed for cough.    Dispense:  20 capsule    Refill:  0    Order Specific Question:   Supervising Provider    Answer:   Arville Care A [[id number]]     The above assessment and management plan was discussed with the patient. The patient verbalized understanding of and has agreed to the management plan. Patient is aware  to call the clinic if symptoms persist or worsen. Patient is aware when to return to the clinic for a follow-up visit. Patient educated on when it is appropriate to go to the emergency department.   Mary-Margaret Daphine Deutscher, FNP

## 2023-09-11 NOTE — Progress Notes (Unsigned)
Paul Murillo arrived 09/11/2023 and has given verbal consent to obtain images and complete their overdue diabetic retinal screening.  The images have been sent to an ophthalmologist or optometrist for review and interpretation.  Results will be sent back to Paul Spencer, FNP for review.  Patient has been informed they will be contacted when we receive the results via telephone or MyChart

## 2023-09-11 NOTE — Patient Instructions (Signed)

## 2023-09-16 ENCOUNTER — Ambulatory Visit (HOSPITAL_BASED_OUTPATIENT_CLINIC_OR_DEPARTMENT_OTHER)
Admission: RE | Admit: 2023-09-16 | Discharge: 2023-09-16 | Disposition: A | Discharge status: Home / Self Care | Payer: Medicare Other | Source: Ambulatory Visit | Attending: Family | Admitting: Family

## 2023-09-16 DIAGNOSIS — R053 Chronic cough: Secondary | ICD-10-CM | POA: Diagnosis not present

## 2023-09-16 DIAGNOSIS — R052 Subacute cough: Secondary | ICD-10-CM | POA: Insufficient documentation

## 2023-09-16 DIAGNOSIS — J432 Centrilobular emphysema: Secondary | ICD-10-CM | POA: Diagnosis not present

## 2023-09-16 DIAGNOSIS — K2289 Other specified disease of esophagus: Secondary | ICD-10-CM | POA: Diagnosis not present

## 2023-09-26 ENCOUNTER — Ambulatory Visit: Payer: Medicare Other | Admitting: Family

## 2023-09-27 ENCOUNTER — Other Ambulatory Visit: Payer: Self-pay | Admitting: Family

## 2023-09-27 DIAGNOSIS — K219 Gastro-esophageal reflux disease without esophagitis: Secondary | ICD-10-CM

## 2023-10-03 ENCOUNTER — Other Ambulatory Visit: Payer: Self-pay | Admitting: Family

## 2023-10-03 DIAGNOSIS — M8700 Idiopathic aseptic necrosis of unspecified bone: Secondary | ICD-10-CM

## 2023-10-03 DIAGNOSIS — J439 Emphysema, unspecified: Secondary | ICD-10-CM | POA: Insufficient documentation

## 2023-10-03 DIAGNOSIS — I700 Atherosclerosis of aorta: Secondary | ICD-10-CM | POA: Insufficient documentation

## 2023-10-05 ENCOUNTER — Other Ambulatory Visit: Payer: Self-pay | Admitting: Family

## 2023-10-05 DIAGNOSIS — Z87891 Personal history of nicotine dependence: Secondary | ICD-10-CM

## 2023-10-05 DIAGNOSIS — R052 Subacute cough: Secondary | ICD-10-CM

## 2023-10-09 ENCOUNTER — Institutional Professional Consult (permissible substitution): Payer: Medicare Other | Admitting: Internal Medicine

## 2023-10-11 NOTE — Progress Notes (Unsigned)
Office Visit Note   Patient: Paul Murillo           Date of Birth: 02/28/1956           MRN: [id number] Visit Date: 10/12/2023              Requested by: Junie Spencer, FNP [address] [place],  [place] [address] PCP: Junie Spencer, FNP   Assessment & Plan: Visit Diagnoses: No diagnosis found.  Plan: ***  Follow-Up Instructions: No follow-ups on file.   Orders:  No orders of the defined types were placed in this encounter.  No orders of the defined types were placed in this encounter.     Procedures: No procedures performed   Clinical Data: No additional findings.   Subjective: No chief complaint on file.   HPI  Review of Systems  Constitutional: Negative.   HENT: Negative.    Eyes: Negative.   Respiratory: Negative.    Cardiovascular: Negative.   Gastrointestinal: Negative.   Endocrine: Negative.   Genitourinary: Negative.   Skin: Negative.   Allergic/Immunologic: Negative.   Neurological: Negative.   Hematological: Negative.   Psychiatric/Behavioral: Negative.    All other systems reviewed and are negative.   Objective: Vital Signs: There were no vitals taken for this visit.  Physical Exam Vitals and nursing note reviewed.  Constitutional:      Appearance: He is well-developed.  HENT:     Head: Normocephalic and atraumatic.  Eyes:     Pupils: Pupils are equal, round, and reactive to light.  Pulmonary:     Effort: Pulmonary effort is normal.  Abdominal:     Palpations: Abdomen is soft.  Musculoskeletal:        General: Normal range of motion.     Cervical back: Neck supple.  Skin:    General: Skin is warm.  Neurological:     Mental Status: He is alert and oriented to person, place, and time.  Psychiatric:        Behavior: Behavior normal.        Thought Content: Thought content normal.        Judgment: Judgment normal.   Ortho Exam  Specialty Comments:  No specialty comments available.  Imaging: No results  found.   PMFS History: Patient Active Problem List   Diagnosis Date Noted  . Aortic atherosclerosis (HCC) 10/03/2023  . Pulmonary emphysema (HCC) 10/03/2023  . Prediabetes 03/30/2023  . Stage 3a chronic kidney disease (HCC) 01/12/2022  . Hypothyroidism 02/01/2021  . H/O malignant neoplasm of prostate 03/24/2020  . Old myocardial infarction 07/14/2019  . BPH (benign prostatic hyperplasia) 05/14/2018  . Coronary artery disease involving native coronary artery of native heart without angina pectoris 02/11/2016  . H/O heart artery stent 02/11/2016  . Esophageal reflux 08/25/2015  . CAD S/P percutaneous coronary angioplasty 02/07/2014  . Hyperlipemia 01/13/2011  . HTN (hypertension) 01/13/2011  . Hx of smoking 01/13/2011  . Vitamin D deficiency 01/13/2011  . ED (erectile dysfunction) 01/13/2011  . Pure hypercholesterolemia 01/13/2011  . SYNCOPE 12/14/2010   Past Medical History:  Diagnosis Date  . Cancer [facility])    Prostate  . Coronary artery disease   . ED (erectile dysfunction)   . Genitourinary disorder   . Headache   . Hypercholesteremia   . Hypertension   . MI (myocardial infarction) (HCC)   . Reflux   . Syncope   . Vitamin D deficiency     Family History  Problem Relation Age of  Onset  . Heart disease Father   . Hypertension Father   . Diabetes Brother   . Cancer Neg Hx     Past Surgical History:  Procedure Laterality Date  . APPENDECTOMY    . HERNIA REPAIR    . LEFT HEART CATHETERIZATION WITH CORONARY ANGIOGRAM N/A 01/29/2014   Procedure: LEFT HEART CATHETERIZATION WITH CORONARY ANGIOGRAM;  Surgeon: Peter M Swaziland, MD;  Location: [facility] CATH LAB;  Service: Cardiovascular;  Laterality: N/A;  . TOTAL HIP ARTHROPLASTY     Social History   Occupational History  . Occupation: Retired  Tobacco Use  . Smoking status: Former    Current packs/day: 0.00    Average packs/day: 0.3 packs/day for 50.0 years (12.5 ttl pk-yrs)    Types: Cigarettes    Start date: 12/31/1971     Quit date: 12/30/2021    Years since quitting: 1.7  . Smokeless tobacco: Never  . Tobacco comments:    Quit 2021 - started back  Vaping Use  . Vaping status: Never Used  Substance and Sexual Activity  . Alcohol use: Yes    Alcohol/week: 0.0 standard drinks of alcohol    Comment: occ.  . Drug use: No  . Sexual activity: Yes

## 2023-10-12 ENCOUNTER — Other Ambulatory Visit (INDEPENDENT_AMBULATORY_CARE_PROVIDER_SITE_OTHER): Payer: Self-pay

## 2023-10-12 ENCOUNTER — Encounter: Payer: Self-pay | Admitting: Orthopaedic Surgery

## 2023-10-12 ENCOUNTER — Ambulatory Visit: Payer: Medicare Other | Admitting: Orthopaedic Surgery

## 2023-10-12 DIAGNOSIS — D1602 Benign neoplasm of scapula and long bones of left upper limb: Secondary | ICD-10-CM | POA: Diagnosis not present

## 2023-10-14 ENCOUNTER — Other Ambulatory Visit: Payer: Self-pay | Admitting: Family

## 2023-10-14 DIAGNOSIS — E559 Vitamin D deficiency, unspecified: Secondary | ICD-10-CM

## 2023-10-15 ENCOUNTER — Other Ambulatory Visit: Payer: Self-pay | Admitting: Family

## 2023-10-16 ENCOUNTER — Ambulatory Visit: Payer: Medicare Other | Admitting: Family

## 2023-10-16 ENCOUNTER — Encounter: Payer: Self-pay | Admitting: Family

## 2023-10-16 ENCOUNTER — Other Ambulatory Visit: Payer: Self-pay | Admitting: Family

## 2023-10-16 VITALS — BP 138/75 | HR 78 | Temp 97.3°F | Ht 73.0 in | Wt 210.6 lb

## 2023-10-16 DIAGNOSIS — R7303 Prediabetes: Secondary | ICD-10-CM

## 2023-10-16 DIAGNOSIS — Z87891 Personal history of nicotine dependence: Secondary | ICD-10-CM | POA: Diagnosis not present

## 2023-10-16 DIAGNOSIS — J432 Centrilobular emphysema: Secondary | ICD-10-CM

## 2023-10-16 DIAGNOSIS — E7800 Pure hypercholesterolemia, unspecified: Secondary | ICD-10-CM

## 2023-10-16 DIAGNOSIS — K219 Gastro-esophageal reflux disease without esophagitis: Secondary | ICD-10-CM

## 2023-10-16 DIAGNOSIS — Z0000 Encounter for general adult medical examination without abnormal findings: Secondary | ICD-10-CM

## 2023-10-16 DIAGNOSIS — N1831 Chronic kidney disease, stage 3a: Secondary | ICD-10-CM

## 2023-10-16 DIAGNOSIS — I10 Essential (primary) hypertension: Secondary | ICD-10-CM

## 2023-10-16 DIAGNOSIS — I2510 Atherosclerotic heart disease of native coronary artery without angina pectoris: Secondary | ICD-10-CM

## 2023-10-16 DIAGNOSIS — E039 Hypothyroidism, unspecified: Secondary | ICD-10-CM | POA: Diagnosis not present

## 2023-10-16 DIAGNOSIS — Z0001 Encounter for general adult medical examination with abnormal findings: Secondary | ICD-10-CM

## 2023-10-16 DIAGNOSIS — Z8546 Personal history of malignant neoplasm of prostate: Secondary | ICD-10-CM

## 2023-10-16 DIAGNOSIS — E559 Vitamin D deficiency, unspecified: Secondary | ICD-10-CM

## 2023-10-16 DIAGNOSIS — N401 Enlarged prostate with lower urinary tract symptoms: Secondary | ICD-10-CM

## 2023-10-16 DIAGNOSIS — Z9861 Coronary angioplasty status: Secondary | ICD-10-CM

## 2023-10-16 DIAGNOSIS — E782 Mixed hyperlipidemia: Secondary | ICD-10-CM | POA: Diagnosis not present

## 2023-10-16 LAB — BAYER DCA HB A1C WAIVED: HB A1C (BAYER DCA - WAIVED): 6.3 % — ABNORMAL HIGH (ref 4.8–5.6)

## 2023-10-16 MED ORDER — OMEPRAZOLE 20 MG PO CPDR: DELAYED_RELEASE_CAPSULE | ORAL | 0 refills | Status: DC

## 2023-10-16 MED ORDER — TAMSULOSIN HCL 0.4 MG PO CAPS: 0.4000 mg | ORAL_CAPSULE | Freq: Every day | ORAL | 2 refills | Status: DC

## 2023-10-16 MED ORDER — ATORVASTATIN CALCIUM 80 MG PO TABS: 80.0000 mg | ORAL_TABLET | Freq: Every day | ORAL | 0 refills | Status: DC

## 2023-10-16 MED ORDER — ALFUZOSIN HCL ER 10 MG PO TB24: 10.0000 mg | ORAL_TABLET | Freq: Every day | ORAL | 1 refills | Status: DC

## 2023-10-16 MED ORDER — VITAMIN D (ERGOCALCIFEROL) 1.25 MG (50000 UNIT) PO CAPS: 50000.0000 [IU] | ORAL_CAPSULE | ORAL | 1 refills | Status: DC

## 2023-10-16 NOTE — Progress Notes (Signed)
Subjective:    Patient ID: Paul Murillo, male    DOB: [birth year], 67 y.o.   MRN: [id number]  Chief Complaint  Patient presents with   Medical Management of Chronic Issues    6 month follow up    Pt presents to the office today for CPE and chronic follow up . Pt is followed by Urologists annually for Prostate Cancer.  PT states he is followed by Cardiologists annually for CAD.   He has had a chronic cough for over 6 months. Has referral to Pulmonologist.   He has CKD and limits NSAID's. Stable.  Hypertension This is a chronic problem. The current episode started more than 1 year ago. The problem has been resolved since onset. The problem is controlled. Associated symptoms include malaise/fatigue. Pertinent negatives include no peripheral edema or shortness of breath. Risk factors for coronary artery disease include dyslipidemia, diabetes mellitus, male gender and sedentary lifestyle. The current treatment provides moderate improvement. Identifiable causes of hypertension include a thyroid problem.  Thyroid Problem Presents for follow-up visit. Symptoms include fatigue. Patient reports no anxiety, constipation or dry skin. The symptoms have been stable. His past medical history is significant for hyperlipidemia.  Benign Prostatic Hypertrophy This is a chronic problem. The current episode started more than 1 year ago. Irritative symptoms include nocturia (3). Past treatments include tamsulosin and finasteride. The treatment provided mild relief.  Hyperlipidemia This is a chronic problem. The current episode started more than 1 year ago. The problem is controlled. Recent lipid tests were reviewed and are normal. Pertinent negatives include no shortness of breath. Current antihyperlipidemic treatment includes statins. The current treatment provides mild improvement of lipids. Risk factors for coronary artery disease include dyslipidemia, hypertension, a sedentary lifestyle and male sex.       Review of Systems  Constitutional:  Positive for fatigue and malaise/fatigue.  Respiratory:  Negative for shortness of breath.   Gastrointestinal:  Negative for constipation.  Genitourinary:  Positive for nocturia (3).  Psychiatric/Behavioral:  The patient is not nervous/anxious.   All other systems reviewed and are negative.   Social History   Socioeconomic History   Marital status: Married    Spouse name: Liborio Nixon   Number of children: 2   Years of education: Not on file   Highest education level: 12th grade  Occupational History   Occupation: Retired  Tobacco Use   Smoking status: Former    Current packs/day: 0.00    Average packs/day: 0.3 packs/day for 50.0 years (12.5 ttl pk-yrs)    Types: Cigarettes    Start date: 12/31/1971    Quit date: 12/30/2021    Years since quitting: 1.7   Smokeless tobacco: Never   Tobacco comments:    Quit 2021 - started back  Vaping Use   Vaping status: Never Used  Substance and Sexual Activity   Alcohol use: Yes    Alcohol/week: 0.0 standard drinks of alcohol    Comment: occ.   Drug use: No   Sexual activity: Yes  Other Topics Concern   Not on file  Social History Narrative   Children live in [place]   Social Drivers of Health   Financial Resource Strain: Low Risk  (12/20/2022)   Overall Financial Resource Strain (CARDIA)    Difficulty of Paying Living Expenses: Not hard at all  Food Insecurity: No Food Insecurity (12/20/2022)   Hunger Vital Sign    Worried About Running Out of Food in the Last Year: Never true    Ran Out  of Food in the Last Year: Never true  Transportation Needs: No Transportation Needs (12/20/2022)   PRAPARE - Administrator, Civil Service (Medical): No    Lack of Transportation (Non-Medical): No  Physical Activity: Insufficiently Active (12/20/2022)   Exercise Vital Sign    Days of Exercise per Week: 3 days    Minutes of Exercise per Session: 30 min  Stress: No Stress Concern Present (12/20/2022)    [employer] of Occupational Health - Occupational Stress Questionnaire    Feeling of Stress : Not at all  Social Connections: Moderately Integrated (12/20/2022)   Social Connection and Isolation Panel [NHANES]    Frequency of Communication with Friends and Family: More than three times a week    Frequency of Social Gatherings with Friends and Family: More than three times a week    Attends Religious Services: More than 4 times per year    Active Member of [employer] or Organizations: No    Attends Banker Meetings: Never    Marital Status: Married   Family History  Problem Relation Age of Onset   Heart disease Father    Hypertension Father    Diabetes Brother    Cancer Neg Hx         Objective:   Physical Exam Vitals reviewed.  Constitutional:      General: He is not in acute distress.    Appearance: He is well-developed.  HENT:     Head: Normocephalic.     Right Ear: Tympanic membrane and external ear normal.     Left Ear: Tympanic membrane and external ear normal.  Eyes:     General:        Right eye: No discharge.        Left eye: No discharge.     Pupils: Pupils are equal, round, and reactive to light.  Neck:     Thyroid: No thyromegaly.  Cardiovascular:     Rate and Rhythm: Normal rate and regular rhythm.     Heart sounds: Normal heart sounds. No murmur heard. Pulmonary:     Effort: Pulmonary effort is normal. No respiratory distress.     Breath sounds: Normal breath sounds. No wheezing.  Abdominal:     General: Bowel sounds are normal. There is no distension.     Palpations: Abdomen is soft.     Tenderness: There is no abdominal tenderness.  Musculoskeletal:        General: No tenderness. Normal range of motion.     Cervical back: Normal range of motion and neck supple.  Skin:    General: Skin is warm and dry.     Findings: No erythema or rash.  Neurological:     Mental Status: He is alert and oriented to person, place, and time.     Cranial  Nerves: No cranial nerve deficit.     Deep Tendon Reflexes: Reflexes are normal and symmetric.  Psychiatric:        Behavior: Behavior normal.        Thought Content: Thought content normal.        Judgment: Judgment normal.       BP 138/75   Pulse 78   Temp (!) 97.3 F (36.3 C)   Ht 6\' 1"  (1.854 m)   Wt 210 lb 9.6 oz (95.5 kg)   SpO2 95%   BMI 27.79 kg/m      Assessment & Plan:  Paul Murillo comes in today with chief complaint of  Medical Management of Chronic Issues (6 month follow up )   Diagnosis and orders addressed:  1. Primary hypertension - CBC with Differential/Platelet - CMP14+EGFR  2. Hypothyroidism, unspecified type - CBC with Differential/Platelet - CMP14+EGFR - TSH  3. Prediabetes - CBC with Differential/Platelet - CMP14+EGFR - Bayer DCA Hb A1c Waived  4. Mixed hyperlipidemia - CBC with Differential/Platelet - CMP14+EGFR - Lipid panel  5. Annual physical exam (Primary) - CBC with Differential/Platelet - CMP14+EGFR - Lipid panel - TSH - Bayer DCA Hb A1c Waived - PSA, total and free  6. Hx of smoking - CBC with Differential/Platelet - CMP14+EGFR  7. CAD S/P percutaneous coronary angioplasty - CBC with Differential/Platelet - CMP14+EGFR  8. Benign prostatic hyperplasia with nocturia - CBC with Differential/Platelet - CMP14+EGFR - PSA, total and free - tamsulosin (FLOMAX) 0.4 MG CAPS capsule; Take 1 capsule (0.4 mg total) by mouth daily.  Dispense: 90 capsule; Refill: 2  9. H/O malignant neoplasm of prostate - CBC with Differential/Platelet - CMP14+EGFR - PSA, total and free  10. Pure hypercholesterolemia - CBC with Differential/Platelet - CMP14+EGFR  11. Stage 3a chronic kidney disease (HCC) - CBC with Differential/Platelet - CMP14+EGFR  12. Centrilobular emphysema (HCC) - CBC with Differential/Platelet - CMP14+EGFR  13. Gastroesophageal reflux disease, unspecified whether esophagitis present - omeprazole (PRILOSEC) 20  MG capsule; TAKE 1 CAPSULE BY MOUTH EVERY DAY  Dispense: 90 capsule; Refill: 0  14. Vitamin D deficiency - Vitamin D, Ergocalciferol, (DRISDOL) 1.25 MG (50000 UNIT) CAPS capsule; Take 1 capsule (50,000 Units total) by mouth every 7 (seven) days.  Dispense: 12 capsule; Refill: 1   Labs pending Continue current medications  Keep follow up with specialists  Health Maintenance reviewed Diet and exercise encouraged  Return in about 6 months (around 04/15/2024), or if symptoms worsen or fail to improve.    Jannifer Rodney, FNP

## 2023-10-16 NOTE — Patient Instructions (Signed)

## 2023-10-17 LAB — CBC WITH DIFFERENTIAL/PLATELET
Basophils Absolute: 0 10*3/uL (ref 0.0–0.2)
Basos: 0 %
EOS (ABSOLUTE): 0.5 10*3/uL — ABNORMAL HIGH (ref 0.0–0.4)
Eos: 6 %
Hematocrit: 44.6 % (ref 37.5–51.0)
Hemoglobin: 14.6 g/dL (ref 13.0–17.7)
Immature Grans (Abs): 0 10*3/uL (ref 0.0–0.1)
Immature Granulocytes: 0 %
Lymphocytes Absolute: 2.6 10*3/uL (ref 0.7–3.1)
Lymphs: 31 %
MCH: 30.7 pg (ref 26.6–33.0)
MCHC: 32.7 g/dL (ref 31.5–35.7)
MCV: 94 fL (ref 79–97)
Monocytes Absolute: 0.8 10*3/uL (ref 0.1–0.9)
Monocytes: 10 %
Neutrophils Absolute: 4.5 10*3/uL (ref 1.4–7.0)
Neutrophils: 53 %
Platelets: 266 10*3/uL (ref 150–450)
RBC: 4.76 x10E6/uL (ref 4.14–5.80)
RDW: 14.1 % (ref 11.6–15.4)
WBC: 8.5 10*3/uL (ref 3.4–10.8)

## 2023-10-17 LAB — PSA, TOTAL AND FREE
PSA, Free Pct: 8.3 %
PSA, Free: 0.05 ng/mL
Prostate Specific Ag, Serum: 0.6 ng/mL (ref 0.0–4.0)

## 2023-10-17 LAB — CMP14+EGFR
ALT: 22 [IU]/L (ref 0–44)
AST: 25 [IU]/L (ref 0–40)
Albumin: 4.7 g/dL (ref 3.9–4.9)
Alkaline Phosphatase: 104 [IU]/L (ref 44–121)
BUN/Creatinine Ratio: 8 — ABNORMAL LOW (ref 10–24)
BUN: 10 mg/dL (ref 8–27)
Bilirubin Total: 0.7 mg/dL (ref 0.0–1.2)
CO2: 22 mmol/L (ref 20–29)
Calcium: 9.9 mg/dL (ref 8.6–10.2)
Chloride: 105 mmol/L (ref 96–106)
Creatinine, Ser: 1.22 mg/dL (ref 0.76–1.27)
Globulin, Total: 2.1 g/dL (ref 1.5–4.5)
Glucose: 103 mg/dL — ABNORMAL HIGH (ref 70–99)
Potassium: 4.5 mmol/L (ref 3.5–5.2)
Sodium: 141 mmol/L (ref 134–144)
Total Protein: 6.8 g/dL (ref 6.0–8.5)
eGFR: 64 mL/min/{1.73_m2} (ref >59)

## 2023-10-17 LAB — LIPID PANEL
Chol/HDL Ratio: 3.2 {ratio} (ref 0.0–5.0)
Cholesterol, Total: 167 mg/dL (ref 100–199)
HDL: 53 mg/dL (ref >39)
LDL Chol Calc (NIH): 92 mg/dL (ref 0–99)
Triglycerides: 127 mg/dL (ref 0–149)
VLDL Cholesterol Cal: 22 mg/dL (ref 5–40)

## 2023-10-17 LAB — TSH: TSH: 1.08 u[IU]/mL (ref 0.450–4.500)

## 2023-10-20 ENCOUNTER — Telehealth: Payer: Self-pay | Admitting: Family

## 2023-10-20 ENCOUNTER — Ambulatory Visit: Payer: Self-pay | Admitting: Family

## 2023-10-20 DIAGNOSIS — R058 Other specified cough: Secondary | ICD-10-CM | POA: Diagnosis not present

## 2023-10-20 DIAGNOSIS — R062 Wheezing: Secondary | ICD-10-CM | POA: Diagnosis not present

## 2023-10-20 NOTE — Telephone Encounter (Signed)
Copied from CRM [id number]. Topic: Clinical - Red Word Triage >> Oct 20, 2023  3:27 PM [name] wrote: Red Word that prompted transfer to Nurse Triage: Wheezing  Chief Complaint: shortness of breath and wheezing Symptoms: cough, sob, wheeze Frequency: started over Christmas Pertinent Negatives: Patient denies fever Disposition: [x] ED /[] Urgent Care (no appt availability in office) / [] Appointment(In office/virtual)/ []  [facility]/ [] Home Care/ [] Refused Recommended Disposition /[] [facility]/ []  Follow-up with PCP Additional Notes: patient called in with c/o wheezing and difficulty breathing.  Patient able to hold conversation without difficulty.  States this started over Christmas and is getting progressively worse.  Instructed to go to UC now or ER if becomes worse.  Patient states will wait for wife to return and will go to UC.   Reason for Disposition  [1] MODERATE difficulty breathing (e.g., speaks in phrases, SOB even at rest, pulse 100-120) AND [2] NEW-onset or WORSE than normal  Answer Assessment - Initial Assessment Questions 1. RESPIRATORY STATUS: "Describe your breathing?" (e.g., wheezing, shortness of breath, unable to speak, severe coughing)      Wheezing and shortness of breath 2. ONSET: "When did this breathing problem begin?"      Over Christmas 3. PATTERN "Does the difficult breathing come and go, or has it been constant since it started?"      constant 4. SEVERITY: "How bad is your breathing?" (e.g., mild, moderate, severe)    - MILD: No SOB at rest, mild SOB with walking, speaks normally in sentences, can lie down, no retractions, pulse < 100.    - MODERATE: SOB at rest, SOB with minimal exertion and prefers to sit, cannot lie down flat, speaks in phrases, mild retractions, audible wheezing, pulse 100-120.    - SEVERE: Very SOB at rest, speaks in single words, struggling to breathe, sitting hunched forward, retractions, pulse > 120      severe 5.  RECURRENT SYMPTOM: "Have you had difficulty breathing before?" If Yes, ask: "When was the last time?" and "What happened that time?"      Yes, has albuterol 6. CARDIAC HISTORY: "Do you have any history of heart disease?" (e.g., heart attack, angina, bypass surgery, angioplasty)      yes 7. LUNG HISTORY: "Do you have any history of lung disease?"  (e.g., pulmonary embolus, asthma, emphysema)    Has had blood clots in legs 8. CAUSE: "What do you think is causing the breathing problem?"      unknown 9. OTHER SYMPTOMS: "Do you have any other symptoms? (e.g., dizziness, runny nose, cough, chest pain, fever)     No fever; has chest pain from cough.  Protocols used: Breathing Difficulty-A-AH

## 2023-10-20 NOTE — Telephone Encounter (Signed)
Pt called to check on status of his Pulmonary referral. Needs it as close to home as possible.  Copied from CRM [id number]. Topic: Referral - Question >> Oct 20, 2023  3:25 PM [name] wrote: Reason for CRM: Patient is seeking a referral for a Pulmonologist, he wants to speak with someone about his options because he said he thinks his medication is not working. He is wanting to be referred somewhere as close as he can to his home.

## 2023-10-23 NOTE — Telephone Encounter (Signed)
Patient requested [facility] area per most recent Pulmonology Referral - Patient was sent to [facility] and refused to schedule an Appt. Patient needs to call [facility] at [phone] to schedule an Appt.

## 2023-10-23 NOTE — Telephone Encounter (Signed)
Please have him call this office and make an appointment.

## 2023-10-23 NOTE — Telephone Encounter (Signed)
Patient aware and verbalized understanding. °

## 2023-11-03 ENCOUNTER — Other Ambulatory Visit: Payer: Self-pay | Admitting: Family Medicine

## 2023-11-03 DIAGNOSIS — R053 Chronic cough: Secondary | ICD-10-CM

## 2023-11-05 ENCOUNTER — Other Ambulatory Visit: Payer: Self-pay | Admitting: Family

## 2023-11-05 DIAGNOSIS — Z9861 Coronary angioplasty status: Secondary | ICD-10-CM

## 2023-11-11 ENCOUNTER — Other Ambulatory Visit: Payer: Self-pay | Admitting: Family Medicine

## 2023-11-11 DIAGNOSIS — R053 Chronic cough: Secondary | ICD-10-CM

## 2023-11-11 DIAGNOSIS — R0982 Postnasal drip: Secondary | ICD-10-CM

## 2023-11-22 DIAGNOSIS — I252 Old myocardial infarction: Secondary | ICD-10-CM | POA: Diagnosis not present

## 2023-11-22 DIAGNOSIS — I5021 Acute systolic (congestive) heart failure: Secondary | ICD-10-CM | POA: Diagnosis not present

## 2023-11-22 DIAGNOSIS — E782 Mixed hyperlipidemia: Secondary | ICD-10-CM | POA: Diagnosis not present

## 2023-11-22 DIAGNOSIS — Z87891 Personal history of nicotine dependence: Secondary | ICD-10-CM | POA: Diagnosis not present

## 2023-11-22 DIAGNOSIS — I110 Hypertensive heart disease with heart failure: Secondary | ICD-10-CM | POA: Diagnosis not present

## 2023-11-22 DIAGNOSIS — I2510 Atherosclerotic heart disease of native coronary artery without angina pectoris: Secondary | ICD-10-CM | POA: Diagnosis not present

## 2023-11-22 DIAGNOSIS — Z955 Presence of coronary angioplasty implant and graft: Secondary | ICD-10-CM | POA: Diagnosis not present

## 2023-12-20 ENCOUNTER — Ambulatory Visit: Payer: Medicare Other | Admitting: Pulmonary Disease

## 2023-12-20 ENCOUNTER — Encounter: Payer: Self-pay | Admitting: Pulmonary Disease

## 2023-12-20 VITALS — BP 153/91 | HR 67 | Ht 73.0 in | Wt 202.0 lb

## 2023-12-20 DIAGNOSIS — K219 Gastro-esophageal reflux disease without esophagitis: Secondary | ICD-10-CM

## 2023-12-20 DIAGNOSIS — R053 Chronic cough: Secondary | ICD-10-CM | POA: Diagnosis not present

## 2023-12-20 DIAGNOSIS — K449 Diaphragmatic hernia without obstruction or gangrene: Secondary | ICD-10-CM

## 2023-12-20 DIAGNOSIS — K229 Disease of esophagus, unspecified: Secondary | ICD-10-CM

## 2023-12-20 DIAGNOSIS — J432 Centrilobular emphysema: Secondary | ICD-10-CM

## 2023-12-20 DIAGNOSIS — F17210 Nicotine dependence, cigarettes, uncomplicated: Secondary | ICD-10-CM

## 2023-12-20 DIAGNOSIS — Z87891 Personal history of nicotine dependence: Secondary | ICD-10-CM | POA: Diagnosis not present

## 2023-12-20 MED ORDER — PREDNISONE 10 MG PO TABS
40.0000 mg | ORAL_TABLET | Freq: Every day | ORAL | 0 refills | Status: DC
Start: 2023-12-20 — End: 2024-01-08

## 2023-12-20 MED ORDER — PANTOPRAZOLE SODIUM 40 MG PO TBEC: 40.0000 mg | DELAYED_RELEASE_TABLET | Freq: Every day | ORAL | 11 refills | Status: DC

## 2023-12-20 MED ORDER — FAMOTIDINE 20 MG PO TABS: 20.0000 mg | ORAL_TABLET | Freq: Every day | ORAL | 11 refills | Status: DC

## 2023-12-20 MED ORDER — BREZTRI AEROSPHERE 160-9-4.8 MCG/ACT IN AERO: 2.0000 | INHALATION_SPRAY | Freq: Two times a day (BID) | RESPIRATORY_TRACT | 11 refills | Status: DC

## 2023-12-20 MED ORDER — AZITHROMYCIN 250 MG PO TABS
ORAL_TABLET | ORAL | 0 refills | Status: DC
Start: 2023-12-20 — End: 2024-01-08

## 2023-12-20 NOTE — Patient Instructions (Addendum)
 Continue breztri inhaler 2 puffs twice daily - rinse mouth out after each use  Take prednisone 40mg  daily for 5 days  Star Zpak for 5 days  Start pantoprazole 40mg  daily, take 30 minutes before breakfast  Start famotidine 20mg  daily at bedtime  Stop omeprazole  We will refer you to our GI team for further evaluation of your esophagus and hiatal hernia and reflux  Follow up in 3 months with pulmonary function tests

## 2023-12-20 NOTE — Progress Notes (Signed)
 Synopsis: Referred in February 2025 for Cough  Subjective:   PATIENT ID: Paul Murillo GENDER: male DOB: 1956-04-13, MRN: [id number]  HPI  Chief Complaint  Patient presents with   Consult    Pt states he has been having all the above in cluding coughing so hard he end up throwing , lots of sputum all day long mostly clear, light headed.   Paul Murillo is a 68 year old male, recent former smoker with history of hypertension and GERD who is referred to pulmonary clinic for cough.  He has been experiencing a persistent cough for over three months, which has progressively worsened. The cough occurs at any time, including during sleep, and has been severe enough to cause syncope on occasion. He also experiences wheezing and notes that the cough sometimes produces a mouthful of white foam, occasionally with pus-like material, but no hemoptysis. He has been using a Breztri inhaler, which initially provided some relief but is no longer effective. No significant weight loss, fevers, night sweats, or chills.  He reports a history of reflux symptoms, particularly after meals, which he believes may contribute to his cough. He describes episodes of coughing after eating, feeling as though 'a big drop of phlegm' has dropped into his throat. He denies dysphagia or food impaction, but notes that the cough is more pronounced postprandially. He takes omeprazole 20 mg at night, which he feels helps if taken regularly.  He has a history of smoking, having quit six months ago. 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  Past Medical History:  Diagnosis Date   Cancer [facility])    Prostate   Coronary artery disease    ED (erectile dysfunction)    Genitourinary disorder    Headache    Hypercholesteremia     Hypertension    MI (myocardial infarction) (HCC)    Reflux    Syncope    Vitamin D deficiency      Family History  Problem Relation Age of Onset   Heart disease Father    Hypertension Father    Diabetes Brother    Cancer Neg Hx      Social History   Socioeconomic History   Marital status: Married    Spouse name: Liborio Nixon   Number of children: 2   Years of education: Not on file   Highest education level: 12th grade  Occupational History   Occupation: Retired  Tobacco Use   Smoking status: Former    Current packs/day: 0.00    Average packs/day: 0.5 packs/day for 50.0 years (25.0 ttl pk-yrs)    Types: Cigarettes    Start date: 12/31/1971    Quit date: 12/30/2021    Years since quitting: 1.9   Smokeless tobacco: Never   Tobacco comments:    Quit 2021 - started back  Vaping Use   Vaping status: Never Used  Substance and Sexual Activity   Alcohol use: Yes    Alcohol/week: 0.0 standard drinks of alcohol    Comment: occ.   Drug use: No   Sexual activity: Yes  Other Topics Concern   Not on file  Social History Narrative   Children live in [place]   Social Drivers of Health   Financial Resource Strain: Low Risk  (12/20/2022)   Overall Financial  Resource Strain (CARDIA)    Difficulty of Paying Living Expenses: Not hard at all  Food Insecurity: No Food Insecurity (12/20/2022)   Hunger Vital Sign    Worried About Running Out of Food in the Last Year: Never true    Ran Out of Food in the Last Year: Never true  Transportation Needs: No Transportation Needs (12/20/2022)   PRAPARE - Administrator, Civil Service (Medical): No    Lack of Transportation (Non-Medical): No  Physical Activity: Insufficiently Active (12/20/2022)   Exercise Vital Sign    Days of Exercise per Week: 3 days    Minutes of Exercise per Session: 30 min  Stress: No Stress Concern Present (12/20/2022)   [employer] of Occupational Health - Occupational Stress Questionnaire    Feeling of Stress :  Not at all  Social Connections: Moderately Integrated (12/20/2022)   Social Connection and Isolation Panel [NHANES]    Frequency of Communication with Friends and Family: More than three times a week    Frequency of Social Gatherings with Friends and Family: More than three times a week    Attends Religious Services: More than 4 times per year    Active Member of [employer] or Organizations: No    Attends Banker Meetings: Never    Marital Status: Married  Catering manager Violence: Not At Risk (12/20/2022)   Humiliation, Afraid, Rape, and Kick questionnaire    Fear of Current or Ex-Partner: No    Emotionally Abused: No    Physically Abused: No    Sexually Abused: No     No Known Allergies   Outpatient Medications Prior to Visit  Medication Sig Dispense Refill   albuterol (VENTOLIN HFA) 108 (90 Base) MCG/ACT inhaler INHALE 2 PUFFS INTO THE LUNGS EVERY 6 HOURS AS NEEDED (COUGH). 8.5 each 2   alfuzosin (UROXATRAL) 10 MG 24 hr tablet Take 1 tablet (10 mg total) by mouth daily with breakfast. 90 tablet 1   aspirin 81 MG EC tablet Take 81 mg by mouth daily.     atorvastatin (LIPITOR) 80 MG tablet Take 1 tablet (80 mg total) by mouth daily. 90 tablet 0   cetirizine (ZYRTEC) 10 MG tablet TAKE 1 TABLET BY MOUTH EVERYDAY AT BEDTIME 90 tablet 1   dapagliflozin propanediol (FARXIGA) 10 MG TABS tablet Take by mouth daily.     fluticasone (FLONASE) 50 MCG/ACT nasal spray PLACE 1 SPRAY INTO BOTH NOSTRILS 2 (TWO) TIMES DAILY AS NEEDED FOR ALLERGIES OR RHINITIS. 48 mL 1   levothyroxine (SYNTHROID) 75 MCG tablet TAKE 1 TABLET BY MOUTH EVERY DAY 90 tablet 2   metoprolol succinate (TOPROL-XL) 25 MG 24 hr tablet TAKE 1 TABLET (25 MG TOTAL) BY MOUTH DAILY. 90 tablet 1   nitroGLYCERIN (NITROSTAT) 0.4 MG SL tablet Place 1 tablet (0.4 mg total) under the tongue every 5 (five) minutes as needed for chest pain. Reported on 04/01/2016 30 tablet 1   oxybutynin (DITROPAN-XL) 10 MG 24 hr tablet Take 1 tablet by  mouth daily.     tamsulosin (FLOMAX) 0.4 MG CAPS capsule Take 1 capsule (0.4 mg total) by mouth daily. 90 capsule 2   telmisartan (MICARDIS) 80 MG tablet Take 1 tablet (80 mg total) by mouth daily. 90 tablet 2   vardenafil (LEVITRA) 20 MG tablet Take 1 tablet (20 mg total) by mouth daily as needed for erectile dysfunction. 10 tablet 2   Vitamin D, Ergocalciferol, (DRISDOL) 1.25 MG (50000 UNIT) CAPS capsule Take 1 capsule (50,000 Units  total) by mouth every 7 (seven) days. 12 capsule 1   Budeson-Glycopyrrol-Formoterol (BREZTRI AEROSPHERE) 160-9-4.8 MCG/ACT AERO Inhale 2 puffs into the lungs 2 (two) times daily.     omeprazole (PRILOSEC) 20 MG capsule TAKE 1 CAPSULE BY MOUTH EVERY DAY 90 capsule 0   No facility-administered medications prior to visit.    Review of Systems  Constitutional:  Negative for chills, fever, malaise/fatigue and weight loss.  HENT:  Negative for congestion, sinus pain and sore throat.   Eyes: Negative.   Respiratory:  Positive for cough and wheezing. Negative for hemoptysis, sputum production and shortness of breath.   Cardiovascular:  Negative for chest pain, palpitations, orthopnea, claudication and leg swelling.  Gastrointestinal:  Positive for heartburn. Negative for abdominal pain, nausea and vomiting.  Genitourinary: Negative.   Musculoskeletal:  Negative for joint pain and myalgias.  Skin:  Negative for rash.  Neurological:  Negative for weakness.  Endo/Heme/Allergies: Negative.   Psychiatric/Behavioral: Negative.     Objective:   Vitals:   12/20/23 1419  BP: (!) 153/91  Pulse: 67  SpO2: 93%  Weight: 202 lb (91.6 kg)  Height: 6\' 1"  (1.854 m)   Physical Exam Constitutional:      General: He is not in acute distress.    Appearance: Normal appearance.  Eyes:     General: No scleral icterus.    Conjunctiva/sclera: Conjunctivae normal.  Cardiovascular:     Rate and Rhythm: Normal rate and regular rhythm.  Pulmonary:     Breath sounds: No wheezing,  rhonchi or rales.  Musculoskeletal:     Right lower leg: No edema.     Left lower leg: No edema.  Skin:    General: Skin is warm and dry.  Neurological:     General: No focal deficit present.    CBC    Component Value Date/Time   WBC 8.5 10/16/2023 1511   WBC 10.4 03/20/2018 0927   RBC 4.76 10/16/2023 1511   RBC 4.40 03/20/2018 0927   HGB 14.6 10/16/2023 1511   HCT 44.6 10/16/2023 1511   PLT 266 10/16/2023 1511   MCV 94 10/16/2023 1511   MCH 30.7 10/16/2023 1511   MCH 31.1 03/20/2018 0927   MCHC 32.7 10/16/2023 1511   MCHC 33.7 03/20/2018 0927   RDW 14.1 10/16/2023 1511   LYMPHSABS 2.6 10/16/2023 1511   MONOABS 0.5 04/09/2014 1130   EOSABS 0.5 (H) 10/16/2023 1511   BASOSABS 0.0 10/16/2023 1511      Latest Ref Rng & Units 10/16/2023    3:11 PM 09/08/2023   10:39 AM 03/24/2023    2:38 PM  BMP  Glucose 70 - 99 mg/dL 478  295  621   BUN 8 - 27 mg/dL 10  11  11    Creatinine 0.76 - 1.27 mg/dL 3.08  6.57  8.46   BUN/Creat Ratio 10 - 24 8  9  9    Sodium 134 - 144 mmol/L 141  139  140   Potassium 3.5 - 5.2 mmol/L 4.5  4.3  4.6   Chloride 96 - 106 mmol/L 105  100  104   CO2 20 - 29 mmol/L 22  21  23    Calcium 8.6 - 10.2 mg/dL 9.9  9.6  9.7    Chest imaging: CT Chest 09/16/23 1.  No acute process in the chest. 2. Aortic atherosclerosis (ICD10-I70.0), coronary artery atherosclerosis and emphysema (ICD10-J43.9). 3. Tiny hiatal hernia. Moderate esophageal dilatation with fluid level within. This suggests dysmotility and/or gastroesophageal reflux. This  could predispose the patient to chronic cough. 4. Avascular necrosis of the left humeral head.  PFT:     No data to display          Labs:  Path:  Echo:  Heart Catheterization:       Assessment & Plan:   Centrilobular emphysema (HCC) - Plan: Budeson-Glycopyrrol-Formoterol (BREZTRI AEROSPHERE) 160-9-4.8 MCG/ACT AERO, predniSONE (DELTASONE) 10 MG tablet, azithromycin (ZITHROMAX) 250 MG tablet, Pulmonary  Function Test, Ambulatory Referral for Lung Cancer Scre  Gastroesophageal reflux disease without esophagitis - Plan: pantoprazole (PROTONIX) 40 MG tablet, famotidine (PEPCID) 20 MG tablet  Hiatal hernia - Plan: Ambulatory referral to Gastroenterology  Esophageal abnormality - Plan: Ambulatory referral to Gastroenterology  Cigarette smoker - Plan: Ambulatory Referral for Lung Cancer Scre  Discussion: Jomes Giraldo is a 68 year old male, recent former smoker with history of hypertension and GERD who is referred to pulmonary clinic for cough.  Chronic Cough Severe cough causing syncope. Likely multifactorial with potential contributions from reflux and underlying airways disease. -Continue Breztri inhaler (2 puffs BID). -Add oral prednisone and Z-Pak for acute bronchitis. -Refer to GI for evaluation of esophageal dysmotility and hiatal hernia. -Initiate pantoprazole and famotidine for reflux management. -Schedule follow-up in 3 months with breathing tests.  Gastroesophageal Reflux Disease (GERD) Presence of hiatal hernia and potential esophageal dysmotility contributing to severe reflux symptoms. -Refer to GI for further evaluation and management. -Initiate pantoprazole and famotidine for reflux management.  Mild Emphysema Noted on previous CT scan, likely contributing to chronic cough. -Continue Breztri inhaler (2 puffs BID). -Schedule breathing tests in 3 months to confirm diagnosis of COPD.  Tobacco Use Quit 6 months ago after long-term use. Eligible for lung cancer screening program. -Enroll in lung cancer screening program with annual CT scans.  General Health Maintenance -Continue omeprazole for heartburn. -Ensure patient rinses mouth after using Breztri inhaler to prevent oral thrush.    Current Outpatient Medications:    albuterol (VENTOLIN HFA) 108 (90 Base) MCG/ACT inhaler, INHALE 2 PUFFS INTO THE LUNGS EVERY 6 HOURS AS NEEDED (COUGH)., Disp: 8.5 each, Rfl: 2    alfuzosin (UROXATRAL) 10 MG 24 hr tablet, Take 1 tablet (10 mg total) by mouth daily with breakfast., Disp: 90 tablet, Rfl: 1   aspirin 81 MG EC tablet, Take 81 mg by mouth daily., Disp: , Rfl:    atorvastatin (LIPITOR) 80 MG tablet, Take 1 tablet (80 mg total) by mouth daily., Disp: 90 tablet, Rfl: 0   azithromycin (ZITHROMAX) 250 MG tablet, Take as directed, Disp: 6 tablet, Rfl: 0   Budeson-Glycopyrrol-Formoterol (BREZTRI AEROSPHERE) 160-9-4.8 MCG/ACT AERO, Inhale 2 puffs into the lungs in the morning and at bedtime., Disp: 10.7 g, Rfl: 11   cetirizine (ZYRTEC) 10 MG tablet, TAKE 1 TABLET BY MOUTH EVERYDAY AT BEDTIME, Disp: 90 tablet, Rfl: 1   dapagliflozin propanediol (FARXIGA) 10 MG TABS tablet, Take by mouth daily., Disp: , Rfl:    famotidine (PEPCID) 20 MG tablet, Take 1 tablet (20 mg total) by mouth at bedtime., Disp: 30 tablet, Rfl: 11   fluticasone (FLONASE) 50 MCG/ACT nasal spray, PLACE 1 SPRAY INTO BOTH NOSTRILS 2 (TWO) TIMES DAILY AS NEEDED FOR ALLERGIES OR RHINITIS., Disp: 48 mL, Rfl: 1   levothyroxine (SYNTHROID) 75 MCG tablet, TAKE 1 TABLET BY MOUTH EVERY DAY, Disp: 90 tablet, Rfl: 2   metoprolol succinate (TOPROL-XL) 25 MG 24 hr tablet, TAKE 1 TABLET (25 MG TOTAL) BY MOUTH DAILY., Disp: 90 tablet, Rfl: 1   nitroGLYCERIN (NITROSTAT) 0.4 MG  SL tablet, Place 1 tablet (0.4 mg total) under the tongue every 5 (five) minutes as needed for chest pain. Reported on 04/01/2016, Disp: 30 tablet, Rfl: 1   oxybutynin (DITROPAN-XL) 10 MG 24 hr tablet, Take 1 tablet by mouth daily., Disp: , Rfl:    pantoprazole (PROTONIX) 40 MG tablet, Take 1 tablet (40 mg total) by mouth daily., Disp: 30 tablet, Rfl: 11   predniSONE (DELTASONE) 10 MG tablet, Take 4 tablets (40 mg total) by mouth daily with breakfast., Disp: 20 tablet, Rfl: 0   tamsulosin (FLOMAX) 0.4 MG CAPS capsule, Take 1 capsule (0.4 mg total) by mouth daily., Disp: 90 capsule, Rfl: 2   telmisartan (MICARDIS) 80 MG tablet, Take 1 tablet (80 mg  total) by mouth daily., Disp: 90 tablet, Rfl: 2   vardenafil (LEVITRA) 20 MG tablet, Take 1 tablet (20 mg total) by mouth daily as needed for erectile dysfunction., Disp: 10 tablet, Rfl: 2   Vitamin D, Ergocalciferol, (DRISDOL) 1.25 MG (50000 UNIT) CAPS capsule, Take 1 capsule (50,000 Units total) by mouth every 7 (seven) days., Disp: 12 capsule, Rfl: 1

## 2023-12-23 ENCOUNTER — Other Ambulatory Visit: Payer: Self-pay | Admitting: Family

## 2023-12-24 ENCOUNTER — Encounter: Payer: Self-pay | Admitting: Pulmonary Disease

## 2024-01-08 ENCOUNTER — Encounter: Payer: Self-pay | Admitting: Gastroenterology

## 2024-01-08 ENCOUNTER — Ambulatory Visit: Admitting: Gastroenterology

## 2024-01-08 VITALS — BP 120/88 | HR 62 | Ht 73.0 in | Wt 201.0 lb

## 2024-01-08 DIAGNOSIS — R9389 Abnormal findings on diagnostic imaging of other specified body structures: Secondary | ICD-10-CM

## 2024-01-08 DIAGNOSIS — K449 Diaphragmatic hernia without obstruction or gangrene: Secondary | ICD-10-CM

## 2024-01-08 DIAGNOSIS — Z1211 Encounter for screening for malignant neoplasm of colon: Secondary | ICD-10-CM

## 2024-01-08 DIAGNOSIS — K219 Gastro-esophageal reflux disease without esophagitis: Secondary | ICD-10-CM

## 2024-01-08 DIAGNOSIS — R1110 Vomiting, unspecified: Secondary | ICD-10-CM

## 2024-01-08 MED ORDER — ONDANSETRON HCL 4 MG PO TABS
4.0000 mg | ORAL_TABLET | Freq: Four times a day (QID) | ORAL | 0 refills | Status: AC | PRN
Start: 2024-01-08 — End: ?

## 2024-01-08 MED ORDER — SUFLAVE 178.7 G PO SOLR: 1.0000 | Freq: Once | ORAL | 0 refills | Status: AC

## 2024-01-08 NOTE — Progress Notes (Signed)
 Chief Complaint: Nausea/vomiting, GERD, hiatal hernia   Referring Provider:    Melody Comas, MD   HPI:     Paul Murillo is a 68 y.o. male with a history of HTN, CAD with prior MI s/p PCI, central lobar emphysema, hypothyroidism, CKD 3, BPH, HLD, previous tobacco use (quit 2024), referred to the Gastroenterology Clinic for evaluation of GERD, hiatal hernia, nausea/vomiting.  He was recently seen in the Pulmonary Clinic on 12/20/2023 with chronic cough and diagnosed with central lobar emphysema, along with GERD and hiatal hernia on imaging and started on pantoprazole 40 mg daily and famotidine 20 mg daily.  He states he has a longer standing history of GERD with index symptoms of heartburn.  Has been taking omeprazole for years which generally controls his heartburn.  More recently developed chronic cough and regurgitation of food.  No recent breakthrough heartburn.  No dysphagia.  Sxs started ~06/2023. Regurgitation stopped after starting pantoprazole and famotidine by Pulmonologist a few weeks ago.  Reviewed labs from 09/2023: Normal CBC, CMP, TSH.  A1c 6.3%  CT chest from 08/2023 notable for tiny hiatal hernia, moderate esophageal dilation with fluid levels suggestive of dysmotility and/or reflux.  Cologuard negative in 11/2020. No prior EGD.  No known family history of CRC, GI malignancy, liver disease, pancreatic disease, or IBD.    Endoscopic History: - 04/2009: Colonoscopy: Normal.  Repeat 10 years   Past Medical History:  Diagnosis Date   Cancer [facility])    Prostate   Coronary artery disease    Depression    ED (erectile dysfunction)    Genitourinary disorder    Headache    Hypercholesteremia    Hypertension    MI (myocardial infarction) (HCC)    Pneumonia    Reflux    Syncope    Vitamin D deficiency      Past Surgical History:  Procedure Laterality Date   APPENDECTOMY     HERNIA REPAIR     LEFT HEART CATHETERIZATION WITH CORONARY ANGIOGRAM N/A  01/29/2014   Procedure: LEFT HEART CATHETERIZATION WITH CORONARY ANGIOGRAM;  Surgeon: Peter M Swaziland, MD;  Location: [facility] CATH LAB;  Service: Cardiovascular;  Laterality: N/A;   TOTAL HIP ARTHROPLASTY     Family History  Problem Relation Age of Onset   Heart disease Father    Hypertension Father    Diabetes Brother    Liver disease Neg Hx    Esophageal cancer Neg Hx    Colon cancer Neg Hx    Social History   Tobacco Use   Smoking status: Former    Current packs/day: 0.00    Average packs/day: 0.5 packs/day for 50.0 years (25.0 ttl pk-yrs)    Types: Cigarettes    Start date: 12/31/1971    Quit date: 12/30/2021    Years since quitting: 2.0   Smokeless tobacco: Never   Tobacco comments:    Quit 2021 - started back  Vaping Use   Vaping status: Never Used  Substance Use Topics   Alcohol use: Yes    Alcohol/week: 0.0 standard drinks of alcohol    Comment: occ.   Drug use: No   Current Outpatient Medications  Medication Sig Dispense Refill   albuterol (VENTOLIN HFA) 108 (90 Base) MCG/ACT inhaler INHALE 2 PUFFS INTO THE LUNGS EVERY 6 HOURS AS NEEDED (COUGH). 8.5 each 2   alfuzosin (UROXATRAL) 10 MG 24 hr tablet Take 1 tablet (10 mg total) by mouth daily  with breakfast. 90 tablet 1   aspirin 81 MG EC tablet Take 81 mg by mouth daily.     atorvastatin (LIPITOR) 80 MG tablet Take 1 tablet (80 mg total) by mouth daily. 90 tablet 0   Budeson-Glycopyrrol-Formoterol (BREZTRI AEROSPHERE) 160-9-4.8 MCG/ACT AERO Inhale 2 puffs into the lungs in the morning and at bedtime. 10.7 g 11   cetirizine (ZYRTEC) 10 MG tablet TAKE 1 TABLET BY MOUTH EVERYDAY AT BEDTIME 90 tablet 1   fluticasone (FLONASE) 50 MCG/ACT nasal spray PLACE 1 SPRAY INTO BOTH NOSTRILS 2 (TWO) TIMES DAILY AS NEEDED FOR ALLERGIES OR RHINITIS. 48 mL 1   levothyroxine (SYNTHROID) 75 MCG tablet TAKE 1 TABLET BY MOUTH EVERY DAY 90 tablet 2   metoprolol succinate (TOPROL-XL) 25 MG 24 hr tablet TAKE 1 TABLET (25 MG TOTAL) BY MOUTH DAILY. 90  tablet 1   pantoprazole (PROTONIX) 40 MG tablet Take 1 tablet (40 mg total) by mouth daily. 30 tablet 11   tamsulosin (FLOMAX) 0.4 MG CAPS capsule Take 1 capsule (0.4 mg total) by mouth daily. 90 capsule 2   telmisartan (MICARDIS) 80 MG tablet Take 1 tablet (80 mg total) by mouth daily. 90 tablet 2   vardenafil (LEVITRA) 20 MG tablet Take 1 tablet (20 mg total) by mouth daily as needed for erectile dysfunction. 10 tablet 2   Vitamin D, Ergocalciferol, (DRISDOL) 1.25 MG (50000 UNIT) CAPS capsule Take 1 capsule (50,000 Units total) by mouth every 7 (seven) days. 12 capsule 1   dapagliflozin propanediol (FARXIGA) 10 MG TABS tablet Take by mouth daily.     famotidine (PEPCID) 20 MG tablet Take 1 tablet (20 mg total) by mouth at bedtime. 30 tablet 11   nitroGLYCERIN (NITROSTAT) 0.4 MG SL tablet Place 1 tablet (0.4 mg total) under the tongue every 5 (five) minutes as needed for chest pain. Reported on 04/01/2016 (Patient not taking: Reported on 01/08/2024) 30 tablet 1   oxybutynin (DITROPAN-XL) 10 MG 24 hr tablet Take 1 tablet by mouth daily.     No current facility-administered medications for this visit.   No Known Allergies   Review of Systems: All systems reviewed and negative except where noted in HPI.     Physical Exam:    Wt Readings from Last 3 Encounters:  01/08/24 201 lb (91.2 kg)  12/20/23 202 lb (91.6 kg)  10/16/23 210 lb 9.6 oz (95.5 kg)    BP 120/88   Pulse 62   Ht 6\' 1"  (1.854 m)   Wt 201 lb (91.2 kg)   BMI 26.52 kg/m  Constitutional:  Pleasant, in no acute distress. Psychiatric: Normal mood and affect. Behavior is normal. Neck supple. No cervical LAD. Cardiovascular: Normal rate, regular rhythm. No edema Pulmonary/chest: Effort normal and breath sounds normal. No wheezing, rales or rhonchi. Abdominal: Soft, nondistended, nontender. Bowel sounds active throughout. There are no masses palpable. No hepatomegaly. Neurological: Alert and oriented to person place and  time. Skin: Skin is warm and dry. No rashes noted.   ASSESSMENT AND PLAN;   1) GERD 2) Hiatal hernia 3) Regurgitation 4) Abdnormal imaging 68 year old male with what sounds like a longer standing history of the reflux for years (heartburn) which have been controlled with omeprazole.  More recently developed regurgitation and chronic cough.  Has had good response to trial of pantoprazole and Pepcid over the last couple of weeks.  Discussed pathophysiology of reflux at length today.  Discussed role of endoscopic evaluation to evaluate for erosive esophagitis, Barrett's Esophagus, grade/severity of hiatal  hernia, etc. and he would like to proceed as follows: - Schedule EGD - Continue pantoprazole and pepcid as prescribed - Continue antireflux lifestyle/dietary modifications with avoidance of exacerbating foods  5) Colon cancer screening Due for repeat CRC screening.  Discussed optical colonoscopy vs Cologuard at length today, and he would like to proceed with optical colonoscopy - Schedule colonoscopy - Can provide with Rx for Zofran 4 mg Q6 hours during bowel prep due to nausea/vomiting with prior prep   The indications, risks, and benefits of EGD and colonoscopy were explained to the patient in detail. Risks include but are not limited to bleeding, perforation, adverse reaction to medications, and cardiopulmonary compromise. Sequelae include but are not limited to the possibility of surgery, hospitalization, and mortality. The patient verbalized understanding and wished to proceed. All questions answered, referred to scheduler and bowel prep ordered. Further recommendations pending results of the exam.     Shellia Cleverly, DO, FACG  01/08/2024, 9:32 AM   Junie Spencer, FNP

## 2024-01-08 NOTE — Patient Instructions (Signed)
 You have been scheduled for an endoscopy and colonoscopy. Please follow the written instructions given to you at your visit today.  If you use inhalers (even only as needed), please bring them with you on the day of your procedure.  DO NOT TAKE 7 DAYS PRIOR TO TEST- Trulicity (dulaglutide) Ozempic, Wegovy (semaglutide) Mounjaro (tirzepatide) Bydureon Bcise (exanatide extended release)  DO NOT TAKE 1 DAY PRIOR TO YOUR TEST Rybelsus (semaglutide) Adlyxin (lixisenatide) Victoza (liraglutide) Byetta (exanatide) ___________________________________________________________________________  Bonita Quin will receive your bowel preparation through [facility], which ensures the lowest copay and home delivery, with outreach via text or call from an [phone] number. Please respond promptly to avoid rescheduling of your procedure. If you are interested in alternative options or have any questions regarding your prep, please contact them at [phone] ____________________________________________________________________________  Your Provider Has Sent Your Bowel Prep Regimen To [facility]   [facility] will contact you to verify your information and collect your copay, if applicable. Enjoy the comfort of your home while your prescription is mailed to you, FREE of any shipping charges.   [facility] accepts all major insurance benefits and applies discounts & coupons.  Have additional questions?   Chat: [email or web address] Call: [phone] Email: [email or web address] NCPDP: [id number]  How will [facility] contact you?  With a Welcome phone call,  a Welcome text and a checkout link in text form.  Texts you receive from [phone] Are NOT Spam.  *To set up delivery, you must complete the checkout process via link or speak to one of the patient care representatives. If [facility] is unable to reach you, your prescription may be delayed.  To avoid long hold times on the phone, you may also  utilize the secure chat feature on the [facility] website to request that they call you back for transaction completion or to expedite your concerns.  Due to recent changes in healthcare laws, you may see the results of your imaging and laboratory studies on MyChart before your provider has had a chance to review them.  We understand that in some cases there may be results that are confusing or concerning to you. Not all laboratory results come back in the same time frame and the provider may be waiting for multiple results in order to interpret others.  Please give [place] 48 hours in order for your provider to thoroughly review all the results before contacting the office for clarification of your results.   _______________________________________________________  If your blood pressure at your visit was 140/90 or greater, please contact your primary care physician to follow up on this.  _______________________________________________________  If you are age 70 or older, your body mass index should be between 23-30. Your Body mass index is 26.52 kg/m. If this is out of the aforementioned range listed, please consider follow up with your Primary Care Provider.  If you are age 76 or younger, your body mass index should be between 19-25. Your Body mass index is 26.52 kg/m. If this is out of the aformentioned range listed, please consider follow up with your Primary Care Provider.   ________________________________________________________  The [facility] providers would like to encourage you to use [employer] to communicate with providers for non-urgent requests or questions.  Due to long hold times on the telephone, sending your provider a message by [facility] may be a faster and more efficient way to get a response.  Please allow 48 business hours for a response.  Please remember that this is for non-urgent requests.  _______________________________________________________  It was  a pleasure to see you  today!  Vito Cirigliano, D.O.

## 2024-01-12 ENCOUNTER — Other Ambulatory Visit: Payer: Self-pay | Admitting: Family

## 2024-01-12 DIAGNOSIS — R053 Chronic cough: Secondary | ICD-10-CM

## 2024-01-24 DIAGNOSIS — Z96641 Presence of right artificial hip joint: Secondary | ICD-10-CM | POA: Diagnosis not present

## 2024-01-24 DIAGNOSIS — Z471 Aftercare following joint replacement surgery: Secondary | ICD-10-CM | POA: Diagnosis not present

## 2024-01-24 DIAGNOSIS — T8459XS Infection and inflammatory reaction due to other internal joint prosthesis, sequela: Secondary | ICD-10-CM | POA: Diagnosis not present

## 2024-02-15 ENCOUNTER — Telehealth: Payer: Self-pay | Admitting: Family

## 2024-02-15 NOTE — Telephone Encounter (Signed)
 Will need to stop the aspirin  prior.

## 2024-02-15 NOTE — Telephone Encounter (Signed)
 Patient aware and verbalized understanding.

## 2024-02-15 NOTE — Telephone Encounter (Signed)
 Copied from CRM [id number]. Topic: Clinical - Medication Question >> Feb 15, 2024  2:21 PM Santiya F wrote: Reason for CRM: Patient is calling in because he has a colonoscopy scheduled and wanted to know if it was safe to continue his current medications. Please advise.

## 2024-02-21 ENCOUNTER — Ambulatory Visit: Admitting: Gastroenterology

## 2024-02-21 ENCOUNTER — Encounter: Payer: Self-pay | Admitting: Gastroenterology

## 2024-02-21 VITALS — BP 148/95 | HR 63 | Temp 97.9°F | Resp 13 | Ht 73.0 in | Wt 201.0 lb

## 2024-02-21 DIAGNOSIS — Z1211 Encounter for screening for malignant neoplasm of colon: Secondary | ICD-10-CM

## 2024-02-21 DIAGNOSIS — K635 Polyp of colon: Secondary | ICD-10-CM | POA: Diagnosis not present

## 2024-02-21 DIAGNOSIS — K449 Diaphragmatic hernia without obstruction or gangrene: Secondary | ICD-10-CM | POA: Diagnosis not present

## 2024-02-21 DIAGNOSIS — D123 Benign neoplasm of transverse colon: Secondary | ICD-10-CM | POA: Diagnosis not present

## 2024-02-21 DIAGNOSIS — B3781 Candidal esophagitis: Secondary | ICD-10-CM | POA: Diagnosis not present

## 2024-02-21 DIAGNOSIS — K3189 Other diseases of stomach and duodenum: Secondary | ICD-10-CM

## 2024-02-21 DIAGNOSIS — D122 Benign neoplasm of ascending colon: Secondary | ICD-10-CM | POA: Diagnosis not present

## 2024-02-21 DIAGNOSIS — D125 Benign neoplasm of sigmoid colon: Secondary | ICD-10-CM

## 2024-02-21 DIAGNOSIS — K641 Second degree hemorrhoids: Secondary | ICD-10-CM | POA: Diagnosis not present

## 2024-02-21 DIAGNOSIS — K2970 Gastritis, unspecified, without bleeding: Secondary | ICD-10-CM

## 2024-02-21 DIAGNOSIS — K219 Gastro-esophageal reflux disease without esophagitis: Secondary | ICD-10-CM

## 2024-02-21 DIAGNOSIS — K5730 Diverticulosis of large intestine without perforation or abscess without bleeding: Secondary | ICD-10-CM | POA: Diagnosis not present

## 2024-02-21 DIAGNOSIS — D124 Benign neoplasm of descending colon: Secondary | ICD-10-CM | POA: Diagnosis not present

## 2024-02-21 DIAGNOSIS — K2270 Barrett's esophagus without dysplasia: Secondary | ICD-10-CM | POA: Diagnosis not present

## 2024-02-21 DIAGNOSIS — K319 Disease of stomach and duodenum, unspecified: Secondary | ICD-10-CM | POA: Diagnosis not present

## 2024-02-21 DIAGNOSIS — I2510 Atherosclerotic heart disease of native coronary artery without angina pectoris: Secondary | ICD-10-CM | POA: Diagnosis not present

## 2024-02-21 MED ORDER — SODIUM CHLORIDE 0.9 % IV SOLN
500.0000 mL | Freq: Once | INTRAVENOUS | Status: DC
Start: 2024-02-21 — End: 2024-02-21

## 2024-02-21 MED ORDER — FLUCONAZOLE 100 MG PO TABS
ORAL_TABLET | ORAL | 0 refills | Status: DC
Start: 2024-02-21 — End: 2024-03-04

## 2024-02-21 NOTE — Patient Instructions (Addendum)
  Educational handout provided to patient related to Hemorrhoids, Polyps, and Diverticulosis, hiatal hernia, gastritis  Resume previous diet  Continue present medications  Awaiting pathology results  YOU HAD AN ENDOSCOPIC PROCEDURE TODAY AT THE [facility]:   Refer to the procedure report that was given to you for any specific questions about what was found during the examination.  If the procedure report does not answer your questions, please call your gastroenterologist to clarify.  If you requested that your care partner not be given the details of your procedure findings, then the procedure report has been included in a sealed envelope for you to review at your convenience later.  YOU SHOULD EXPECT: Some feelings of bloating in the abdomen. Passage of more gas than usual.  Walking can help get rid of the air that was put into your GI tract during the procedure and reduce the bloating. If you had a lower endoscopy (such as a colonoscopy or flexible sigmoidoscopy) you may notice spotting of blood in your stool or on the toilet paper. If you underwent a bowel prep for your procedure, you may not have a normal bowel movement for a few days.  Please Note:  You might notice some irritation and congestion in your nose or some drainage.  This is from the oxygen used during your procedure.  There is no need for concern and it should clear up in a day or so.  SYMPTOMS TO REPORT IMMEDIATELY:  Following lower endoscopy (colonoscopy or flexible sigmoidoscopy):  Excessive amounts of blood in the stool  Significant tenderness or worsening of abdominal pains  Swelling of the abdomen that is new, acute  Fever of 100F or higher  Following upper endoscopy (EGD)  Vomiting of blood or coffee ground material  New chest pain or pain under the shoulder blades  Painful or persistently difficult swallowing  New shortness of breath  Fever of 100F or higher  Black, tarry-looking stools  For  urgent or emergent issues, a gastroenterologist can be reached at any hour by calling [phone]. Do not use MyChart messaging for urgent concerns.    DIET:  We do recommend a small meal at first, but then you may proceed to your regular diet.  Drink plenty of fluids but you should avoid alcoholic beverages for 24 hours.  ACTIVITY:  You should plan to take it easy for the rest of today and you should NOT DRIVE or use heavy machinery until tomorrow (because of the sedation medicines used during the test).    FOLLOW UP: Our staff will call the number listed on your records the next business day following your procedure.  We will call around 7:15- 8:00 am to check on you and address any questions or concerns that you may have regarding the information given to you following your procedure. If we do not reach you, we will leave a message.     If any biopsies were taken you will be contacted by phone or by letter within the next 1-3 weeks.  Please call us  at [phone] if you have not heard about the biopsies in 3 weeks.    SIGNATURES/CONFIDENTIALITY: You and/or your care partner have signed paperwork which will be entered into your electronic medical record.  These signatures attest to the fact that that the information above on your After Visit Summary has been reviewed and is understood.  Full responsibility of the confidentiality of this discharge information lies with you and/or your care-partner.

## 2024-02-21 NOTE — Progress Notes (Signed)
 GASTROENTEROLOGY PROCEDURE H&P NOTE   Primary Care Physician: Yevette Hem, FNP    Reason for Procedure:   GERD, hiatal hernia, nausea/vomiting, regurgitation, colon cancer screening  Plan:    EGD, colonoscopy  Patient is appropriate for endoscopic procedure(s) in the ambulatory ([facility]) setting.  The nature of the procedure, as well as the risks, benefits, and alternatives were carefully and thoroughly reviewed with the patient. Ample time for discussion and questions allowed. The patient understood, was satisfied, and agreed to proceed.     HPI: Paul Murillo is a 68 y.o. male who presents for EGD for evaluation of GERD, regurgitation, hiatal hernia, nausea/vomiting, along with colonoscopy for CRC screening.  Currently taking pantoprazole  and Pepcid .  Past Medical History:  Diagnosis Date   Cancer [facility])    Prostate   Coronary artery disease    Depression    ED (erectile dysfunction)    Genitourinary disorder    GERD (gastroesophageal reflux disease)    Headache    Hypercholesteremia    Hypertension    MI (myocardial infarction) (HCC)    Pneumonia    Reflux    Syncope    Vitamin D  deficiency     Past Surgical History:  Procedure Laterality Date   APPENDECTOMY     HERNIA REPAIR     LEFT HEART CATHETERIZATION WITH CORONARY ANGIOGRAM N/A 01/29/2014   Procedure: LEFT HEART CATHETERIZATION WITH CORONARY ANGIOGRAM;  Surgeon: Peter M Swaz[place], MD;  Location: [facility] CATH LAB;  Service: Cardiovascular;  Laterality: N/A;   TOTAL HIP ARTHROPLASTY      Prior to Admission medications   Medication Sig Start Date End Date Taking? Authorizing Provider  aspirin  81 MG EC tablet Take 81 mg by mouth daily.   Yes [provider]  Vitamin D , Ergocalciferol , (DRISDOL ) 1.25 MG (50000 UNIT) CAPS capsule Take 1 capsule (50,000 Units total) by mouth every 7 (seven) days. 10/16/23  Yes Hawks, Christy A, FNP  albuterol  (VENTOLIN  HFA) 108 (90 Base) MCG/ACT inhaler INHALE 2 PUFFS INTO THE  LUNGS EVERY 6 HOURS AS NEEDED (COUGH). 11/03/23   Yevette Hem, FNP  alfuzosin  (UROXATRAL ) 10 MG 24 hr tablet Take 1 tablet (10 mg total) by mouth daily with breakfast. 10/16/23   Yevette Hem, FNP  atorvastatin  (LIPITOR) 80 MG tablet TAKE 1 TABLET BY MOUTH EVERY DAY 01/12/24   Tommas Fragmin A, FNP  Budeson-Glycopyrrol-Formoterol (BREZTRI  AEROSPHERE) 160-9-4.8 MCG/ACT AERO Inhale 2 puffs into the lungs in the morning and at bedtime. 12/20/23   Wilfredo Hanly, MD  cetirizine  (ZYRTEC ) 10 MG tablet TAKE 1 TABLET BY MOUTH EVERYDAY AT BEDTIME Patient not taking: Reported on 02/21/2024 11/13/23   Galvin Jules, FNP  dapagliflozin propanediol (FARXIGA) 10 MG TABS tablet Take by mouth daily. Patient not taking: Reported on 02/21/2024    [provider]  famotidine  (PEPCID ) 20 MG tablet Take 1 tablet (20 mg total) by mouth at bedtime. 12/20/23   Wilfredo Hanly, MD  fluticasone  (FLONASE ) 50 MCG/ACT nasal spray PLACE 1 SPRAY INTO BOTH NOSTRILS 2 (TWO) TIMES DAILY AS NEEDED FOR ALLERGIES OR RHINITIS. 01/12/24   Tommas Fragmin A, FNP  levothyroxine  (SYNTHROID ) 75 MCG tablet TAKE 1 TABLET BY MOUTH EVERY DAY 12/25/23   Tommas Fragmin A, FNP  metoprolol  succinate (TOPROL -XL) 25 MG 24 hr tablet TAKE 1 TABLET (25 MG TOTAL) BY MOUTH DAILY. 11/06/23   Tommas Fragmin A, FNP  nitroGLYCERIN  (NITROSTAT ) 0.4 MG SL tablet Place 1 tablet (0.4 mg total) under the tongue every  5 (five) minutes as needed for chest pain. Reported on 04/01/2016 Patient not taking: Reported on 01/08/2024 11/06/17   Yevette Hem, FNP  ondansetron  (ZOFRAN ) 4 MG tablet Take 1 tablet (4 mg total) by mouth every 6 (six) hours as needed for nausea or vomiting. Take every 6 hours during bowel preparation for nausea/vomiting 01/08/24   Javeion Cannedy V, DO  oxybutynin (DITROPAN-XL) 10 MG 24 hr tablet Take 1 tablet by mouth daily. 10/12/20   [provider]  pantoprazole  (PROTONIX ) 40 MG tablet Take 1 tablet (40 mg total) by mouth  daily. Patient not taking: Reported on 02/21/2024 12/20/23   Wilfredo Hanly, MD  tamsulosin  (FLOMAX ) 0.4 MG CAPS capsule Take 1 capsule (0.4 mg total) by mouth daily. Patient not taking: Reported on 02/21/2024 10/16/23   Yevette Hem, FNP  telmisartan  (MICARDIS ) 80 MG tablet Take 1 tablet (80 mg total) by mouth daily. 09/22/22   Yevette Hem, FNP  vardenafil  (LEVITRA ) 20 MG tablet Take 1 tablet (20 mg total) by mouth daily as needed for erectile dysfunction. 11/19/20   Yevette Hem, FNP    Current Outpatient Medications  Medication Sig Dispense Refill   aspirin  81 MG EC tablet Take 81 mg by mouth daily.     Vitamin D , Ergocalciferol , (DRISDOL ) 1.25 MG (50000 UNIT) CAPS capsule Take 1 capsule (50,000 Units total) by mouth every 7 (seven) days. 12 capsule 1   albuterol  (VENTOLIN  HFA) 108 (90 Base) MCG/ACT inhaler INHALE 2 PUFFS INTO THE LUNGS EVERY 6 HOURS AS NEEDED (COUGH). 8.5 each 2   alfuzosin  (UROXATRAL ) 10 MG 24 hr tablet Take 1 tablet (10 mg total) by mouth daily with breakfast. 90 tablet 1   atorvastatin  (LIPITOR) 80 MG tablet TAKE 1 TABLET BY MOUTH EVERY DAY 90 tablet 0   Budeson-Glycopyrrol-Formoterol (BREZTRI  AEROSPHERE) 160-9-4.8 MCG/ACT AERO Inhale 2 puffs into the lungs in the morning and at bedtime. 10.7 g 11   cetirizine  (ZYRTEC ) 10 MG tablet TAKE 1 TABLET BY MOUTH EVERYDAY AT BEDTIME (Patient not taking: Reported on 02/21/2024) 90 tablet 1   dapagliflozin propanediol (FARXIGA) 10 MG TABS tablet Take by mouth daily. (Patient not taking: Reported on 02/21/2024)     famotidine  (PEPCID ) 20 MG tablet Take 1 tablet (20 mg total) by mouth at bedtime. 30 tablet 11   fluticasone  (FLONASE ) 50 MCG/ACT nasal spray PLACE 1 SPRAY INTO BOTH NOSTRILS 2 (TWO) TIMES DAILY AS NEEDED FOR ALLERGIES OR RHINITIS. 48 mL 1   levothyroxine  (SYNTHROID ) 75 MCG tablet TAKE 1 TABLET BY MOUTH EVERY DAY 90 tablet 2   metoprolol  succinate (TOPROL -XL) 25 MG 24 hr tablet TAKE 1 TABLET (25 MG TOTAL) BY  MOUTH DAILY. 90 tablet 1   nitroGLYCERIN  (NITROSTAT ) 0.4 MG SL tablet Place 1 tablet (0.4 mg total) under the tongue every 5 (five) minutes as needed for chest pain. Reported on 04/01/2016 (Patient not taking: Reported on 01/08/2024) 30 tablet 1   ondansetron  (ZOFRAN ) 4 MG tablet Take 1 tablet (4 mg total) by mouth every 6 (six) hours as needed for nausea or vomiting. Take every 6 hours during bowel preparation for nausea/vomiting 15 tablet 0   oxybutynin (DITROPAN-XL) 10 MG 24 hr tablet Take 1 tablet by mouth daily.     pantoprazole  (PROTONIX ) 40 MG tablet Take 1 tablet (40 mg total) by mouth daily. (Patient not taking: Reported on 02/21/2024) 30 tablet 11   tamsulosin  (FLOMAX ) 0.4 MG CAPS capsule Take 1 capsule (0.4 mg total) by mouth daily. (Patient not  taking: Reported on 02/21/2024) 90 capsule 2   telmisartan  (MICARDIS ) 80 MG tablet Take 1 tablet (80 mg total) by mouth daily. 90 tablet 2   vardenafil  (LEVITRA ) 20 MG tablet Take 1 tablet (20 mg total) by mouth daily as needed for erectile dysfunction. 10 tablet 2   Current Facility-Administered Medications  Medication Dose Route Frequency Provider Last Rate Last Admin   0.9 %  sodium chloride  infusion  500 mL Intravenous Once Thayne Cindric V, DO        Allergies as of 02/21/2024   (No Known Allergies)    Family History  Problem Relation Age of Onset   Heart disease Father    Hypertension Father    Diabetes Brother    Liver disease Neg Hx    Esophageal cancer Neg Hx    Colon cancer Neg Hx    Colon polyps Neg Hx    Rectal cancer Neg Hx    Stomach cancer Neg Hx     Social History   Socioeconomic History   Marital status: Married    Spouse name: Leola Raisin   Number of children: 2   Years of education: Not on file   Highest education level: 12th grade  Occupational History   Occupation: Retired  Tobacco Use   Smoking status: Former    Current packs/day: 0.00    Average packs/day: 0.5 packs/day for 50.0 years (25.0 ttl pk-yrs)     Types: Cigarettes    Start date: 12/31/1971    Quit date: 12/30/2021    Years since quitting: 2.1   Smokeless tobacco: Never   Tobacco comments:    Quit 2021 - started back  Vaping Use   Vaping status: Never Used  Substance and Sexual Activity   Alcohol use: Yes    Alcohol/week: 0.0 standard drinks of alcohol    Comment: occ.   Drug use: No   Sexual activity: Yes  Other Topics Concern   Not on file  Social History Narrative   Children live in [place]   Social Drivers of Health   Financial Resource Strain: Low Risk  (12/20/2022)   Overall Financial Resource Strain (CARDIA)    Difficulty of Paying Living Expenses: Not hard at all  Food Insecurity: No Food Insecurity (12/20/2022)   Hunger Vital Sign    Worried About Running Out of Food in the Last Year: Never true    Ran Out of Food in the Last Year: Never true  Transportation Needs: No Transportation Needs (12/20/2022)   PRAPARE - Administrator, Civil Service (Medical): No    Lack of Transportation (Non-Medical): No  Physical Activity: Insufficiently Active (12/20/2022)   Exercise Vital Sign    Days of Exercise per Week: 3 days    Minutes of Exercise per Session: 30 min  Stress: No Stress Concern Present (12/20/2022)   [employer] of Occupational Health - Occupational Stress Questionnaire    Feeling of Stress : Not at all  Social Connections: Moderately Integrated (12/20/2022)   Social Connection and Isolation Panel [NHANES]    Frequency of Communication with Friends and Family: More than three times a week    Frequency of Social Gatherings with Friends and Family: More than three times a week    Attends Religious Services: More than 4 times per year    Active Member of Clubs or Organizations: No    Attends Banker Meetings: Never    Marital Status: Married  Catering manager Violence: Not At Risk (12/20/2022)  Humiliation, Afraid, Rape, and Kick questionnaire    Fear of Current or Ex-Partner: No     Emotionally Abused: No    Physically Abused: No    Sexually Abused: No    Physical Exam: Vital signs in last 24 hours: @BP  (!) 159/94   Pulse 68   Temp 97.9 F (36.6 C)   Ht 6\' 1"  (1.854 m)   Wt 201 lb (91.2 kg)   SpO2 94%   BMI 26.52 kg/m  GEN: NAD EYE: Sclerae anicteric ENT: MMM CV: Non-tachycardic Pulm: CTA b/l GI: Soft, NT/ND NEURO:  Alert & Oriented x 3   Harry Lindau, DO [facility]   02/21/2024 10:41 AM

## 2024-02-21 NOTE — Progress Notes (Signed)
 Sedate, gd SR, tolerated procedure well, VSS, report to RN

## 2024-02-21 NOTE — Progress Notes (Signed)
 Called to room to assist during endoscopic procedure.  Patient ID and intended procedure confirmed with present staff. Received instructions for my participation in the procedure from the performing physician.

## 2024-02-21 NOTE — Progress Notes (Signed)
 Pt's states no medical or surgical changes since previsit or office visit.

## 2024-02-21 NOTE — Op Note (Signed)
 [facility] Patient Name: Paul Murillo Procedure Date: 02/21/2024 10:36 AM MRN: [id number] Endoscopist: Harry Lindau , MD, [id number] Age: 68 Referring MD:  Date of Birth: 12-04-1955 Gender: Male Account #: [id number] Procedure:                Colonoscopy Indications:              Screening for colorectal malignant neoplasm (last                            colonoscopy was more than 10 years ago) Medicines:                Monitored Anesthesia Care Procedure:                Pre-Anesthesia Assessment:                           - Prior to the procedure, a History and Physical                            was performed, and patient medications and                            allergies were reviewed. The patient's tolerance of                            previous anesthesia was also reviewed. The risks                            and benefits of the procedure and the sedation                            options and risks were discussed with the patient.                            All questions were answered, and informed consent                            was obtained. Prior Anticoagulants: The patient has                            taken no anticoagulant or antiplatelet agents. ASA                            Grade Assessment: III - A patient with severe                            systemic disease. After reviewing the risks and                            benefits, the patient was deemed in satisfactory                            condition to undergo the procedure.  After obtaining informed consent, the colonoscope                            was passed under direct vision. Throughout the                            procedure, the patient's blood pressure, pulse, and                            oxygen saturations were monitored continuously. The                            CF [id number] #[id number] was introduced through the anus                            and advanced  to the the cecum, identified by                            appendiceal orifice and ileocecal valve. The                            colonoscopy was performed without difficulty. The                            patient tolerated the procedure well. The quality                            of the bowel preparation was adequate. The                            ileocecal valve, appendiceal orifice, and rectum                            were photographed. Scope In: 11:04:29 AM Scope Out: 11:24:34 AM Scope Withdrawal Time: 0 hours 16 minutes 49 seconds  Total Procedure Duration: 0 hours 20 minutes 5 seconds  Findings:                 The perianal and digital rectal examinations were                            normal.                           Three sessile polyps were found in the descending                            colon, transverse colon and ascending colon. The                            polyps were 4 to 6 mm in size. These polyps were                            removed with a cold snare. Resection and retrieval  were complete. Estimated blood loss was minimal.                           Seven sessile polyps were found in the sigmoid                            colon. The polyps were 3 to 5 mm in size. These                            polyps were removed with a cold snare. Resection                            and retrieval were complete. Estimated blood loss                            was minimal.                           Multiple large-mouthed and small-mouthed                            diverticula were found in the sigmoid colon.                           Non-bleeding internal hemorrhoids were found during                            retroflexion. The hemorrhoids were small and Grade                            II (internal hemorrhoids that prolapse but reduce                            spontaneously).                           A small amount of stool was found in the  entire                            colon. Lavage of the area was performed using                            copious amounts of sterile water, resulting in                            clearance with adequate visualization. Complications:            No immediate complications. Estimated Blood Loss:     Estimated blood loss was minimal. Impression:               - Three 4 to 6 mm polyps in the descending colon,                            in the transverse colon and in the ascending colon,  removed with a cold snare. Resected and retrieved.                           - Seven 3 to 5 mm polyps in the sigmoid colon,                            removed with a cold snare. Resected and retrieved.                           - Diverticulosis in the sigmoid colon.                           - Non-bleeding internal hemorrhoids.                           - Stool in the entire examined colon. Recommendation:           - Patient has a contact number available for                            emergencies. The signs and symptoms of potential                            delayed complications were discussed with the                            patient. Return to normal activities tomorrow.                            Written discharge instructions were provided to the                            patient.                           - Resume previous diet.                           - Continue present medications.                           - Await pathology results.                           - Repeat colonoscopy for surveillance based on                            pathology results.                           - Return to GI office PRN. Harry Lindau, MD 02/21/2024 11:37:31 AM

## 2024-02-21 NOTE — Op Note (Signed)
  [facility] Patient Name: Paul Murillo Procedure Date: 02/21/2024 10:36 AM MRN: [id number] Endoscopist: Harry Lindau , MD, [id number] Age: 68 Referring MD:  Date of Birth: 1956/06/20 Gender: Male Account #: [id number] Procedure:                Upper GI endoscopy Indications:              Heartburn, Suspected esophageal reflux, Nausea with                            vomiting, Regurgitation Medicines:                Monitored Anesthesia Care Procedure:                Pre-Anesthesia Assessment:                           - Prior to the procedure, a History and Physical                            was performed, and patient medications and                            allergies were reviewed. The patient's tolerance of                            previous anesthesia was also reviewed. The risks                            and benefits of the procedure and the sedation                            options and risks were discussed with the patient.                            All questions were answered, and informed consent                            was obtained. Prior Anticoagulants: The patient has                            taken no anticoagulant or antiplatelet agents. ASA                            Grade Assessment: III - A patient with severe                            systemic disease. After reviewing the risks and                            benefits, the patient was deemed in satisfactory                            condition to undergo the procedure.  After obtaining informed consent, the endoscope was                            passed under direct vision. Throughout the                            procedure, the patient's blood pressure, pulse, and                            oxygen saturations were monitored continuously. The                            Olympus Scope SN [id number] was introduced through the                            mouth, and advanced to  the second part of duodenum.                            The upper GI endoscopy was accomplished without                            difficulty. The patient tolerated the procedure                            well. Scope In: Scope Out: Findings:                 Diffuse, white plaques were found in the upper                            third of the esophagus, in the middle third of the                            esophagus and in the lower third of the esophagus.                            Biopsies were taken with a cold forceps for                            histology. Estimated blood loss was minimal.                           The Z-line was irregular and was found 35 cm from                            the incisors. There was a single tongue of salmon                            colored mucosa extending 1 cm in length. Biopsies                            were taken with a cold forceps for histology.  Estimated blood loss was minimal.                           A 4 cm hiatal hernia was present.                           Patchy mild inflammation characterized by                            congestion (edema) and erythema was found in the                            gastric fundus, in the gastric body and in the                            gastric antrum. Biopsies were taken with a cold                            forceps for histology. Estimated blood loss was                            minimal.                           The examined duodenum was normal. Complications:            No immediate complications. Estimated Blood Loss:     Estimated blood loss was minimal. Impression:               - Esophageal plaques were found, suspicious for                            candidiasis. Biopsied.                           - Z-line irregular, 35 cm from the incisors.                            Biopsied.                           - 4 cm hiatal hernia.                           -  Gastritis. Biopsied.                           - Normal examined duodenum. Recommendation:           - Patient has a contact number available for                            emergencies. The signs and symptoms of potential                            delayed complications were discussed with the  patient. Return to normal activities tomorrow.                            Written discharge instructions were provided to the                            patient.                           - Resume previous diet.                           - Continue present medications.                           - Await pathology results.                           - Diflucan (fluconazole) 200 mg x1 day, then 100 mg                            PO daily for 3 weeks.                           - Perform a colonoscopy today. Harry Lindau, MD 02/21/2024 11:32:40 AM

## 2024-02-22 ENCOUNTER — Telehealth: Payer: Self-pay

## 2024-02-22 NOTE — Telephone Encounter (Signed)
  Follow up Call-     02/21/2024    9:50 AM  Call back number  Post procedure Call Back phone  # [phone]  Permission to leave phone message Yes     Patient questions:  Do you have a fever, pain , or abdominal swelling? No. Pain Score  0 *  Have you tolerated food without any problems? Yes.    Have you been able to return to your normal activities? Yes.    Do you have any questions about your discharge instructions: Diet   No. Medications  No. Follow up visit  No.  Do you have questions or concerns about your Care? No.  Actions: * If pain score is 4 or above: No action needed, pain <4.

## 2024-02-23 LAB — SURGICAL PATHOLOGY

## 2024-03-04 ENCOUNTER — Other Ambulatory Visit: Payer: Self-pay

## 2024-03-04 DIAGNOSIS — K2970 Gastritis, unspecified, without bleeding: Secondary | ICD-10-CM

## 2024-03-04 MED ORDER — FLUCONAZOLE 100 MG PO TABS: ORAL_TABLET | ORAL | 0 refills | Status: AC

## 2024-03-20 ENCOUNTER — Ambulatory Visit: Payer: Medicare Other | Admitting: Pulmonary Disease

## 2024-03-20 ENCOUNTER — Encounter: Payer: Self-pay | Admitting: Pulmonary Disease

## 2024-03-20 VITALS — BP 142/78 | HR 86 | Ht 73.0 in | Wt 202.8 lb

## 2024-03-20 DIAGNOSIS — Z87891 Personal history of nicotine dependence: Secondary | ICD-10-CM | POA: Diagnosis not present

## 2024-03-20 DIAGNOSIS — K2970 Gastritis, unspecified, without bleeding: Secondary | ICD-10-CM | POA: Diagnosis not present

## 2024-03-20 DIAGNOSIS — J432 Centrilobular emphysema: Secondary | ICD-10-CM

## 2024-03-20 DIAGNOSIS — J449 Chronic obstructive pulmonary disease, unspecified: Secondary | ICD-10-CM

## 2024-03-20 DIAGNOSIS — J439 Emphysema, unspecified: Secondary | ICD-10-CM

## 2024-03-20 DIAGNOSIS — K219 Gastro-esophageal reflux disease without esophagitis: Secondary | ICD-10-CM

## 2024-03-20 LAB — PULMONARY FUNCTION TEST
DL/VA % pred: 93 %
DL/VA: 3.76 ml/min/mmHg/L
DLCO cor % pred: 66 %
DLCO cor: 19.11 ml/min/mmHg
DLCO unc % pred: 66 %
DLCO unc: 19.11 ml/min/mmHg
FEF 25-75 Post: 2.28 L/s
FEF 25-75 Pre: 1.58 L/s
FEF2575-%Change-Post: 44 %
FEF2575-%Pred-Post: 81 %
FEF2575-%Pred-Pre: 56 %
FEV1-%Change-Post: 8 %
FEV1-%Pred-Post: 52 %
FEV1-%Pred-Pre: 48 %
FEV1-Post: 1.94 L
FEV1-Pre: 1.79 L
FEV1FVC-%Change-Post: 6 %
FEV1FVC-%Pred-Pre: 106 %
FEV6-%Change-Post: 1 %
FEV6-%Pred-Post: 49 %
FEV6-%Pred-Pre: 48 %
FEV6-Post: 2.33 L
FEV6-Pre: 2.29 L
FEV6FVC-%Pred-Post: 105 %
FEV6FVC-%Pred-Pre: 105 %
FVC-%Change-Post: 1 %
FVC-%Pred-Post: 46 %
FVC-%Pred-Pre: 45 %
FVC-Post: 2.33 L
FVC-Pre: 2.29 L
Post FEV1/FVC ratio: 83 %
Post FEV6/FVC ratio: 100 %
Pre FEV1/FVC ratio: 78 %
Pre FEV6/FVC Ratio: 100 %
RV % pred: 122 %
RV: 3.18 L
TLC % pred: 75 %
TLC: 5.77 L

## 2024-03-20 MED ORDER — PREDNISONE 10 MG PO TABS: 40.0000 mg | ORAL_TABLET | Freq: Every day | ORAL | 0 refills | Status: DC

## 2024-03-20 MED ORDER — BREZTRI AEROSPHERE 160-9-4.8 MCG/ACT IN AERO: INHALATION_SPRAY | RESPIRATORY_TRACT | Status: DC

## 2024-03-20 NOTE — Progress Notes (Signed)
 Full PFT performed today.

## 2024-03-20 NOTE — Progress Notes (Unsigned)
 Synopsis: Referred in February 2025 for Cough  Subjective:   PATIENT ID: Paul Murillo GENDER: male DOB: 1956/01/29, MRN: [id number]  HPI  Chief Complaint  Patient presents with   Follow-up   Paul Murillo is a 68 year old male, recent former smoker with history of hypertension and GERD who returns to pulmonary clinic for COPD.  Initial OV 12/20/23 He has been experiencing a persistent cough for over three months, which has progressively worsened. The cough occurs at any time, including during sleep, and has been severe enough to cause syncope on occasion. He also experiences wheezing and notes that the cough sometimes produces a mouthful of white foam, occasionally with pus-like material, but no hemoptysis. He has been using a Breztri  inhaler, which initially provided some relief but is no longer effective. No significant weight loss, fevers, night sweats, or chills.  He reports a history of reflux symptoms, particularly after meals, which he believes may contribute to his cough. He describes episodes of coughing after eating, feeling as though 'a big drop of phlegm' has dropped into his throat. He denies dysphagia or food impaction, but notes that the cough is more pronounced postprandially. He takes omeprazole  20 mg at night, which he feels helps if taken regularly.  He has a history of smoking, having quit six months ago. 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  OV 03/20/24 He reports significant improvement in his cough, wheezing and dyspnea after starting breztri  inhaler. He recently ran out 1 week ago and has had increase in his symptoms.   Insurance indicates a cost of $47, but the pharmacy sometimes charges over $200. They will look in to this further and let us  know if we  need to change pharmacies.  PFTs showing mixed obstructive/restrictive pattern and decreased diffusion capacity.   Past Medical History:  Diagnosis Date   Cancer [facility])    Prostate   Coronary artery disease    Depression    ED (erectile dysfunction)    Genitourinary disorder    GERD (gastroesophageal reflux disease)    Headache    Hypercholesteremia    Hypertension    MI (myocardial infarction) (HCC)    Pneumonia    Reflux    Syncope    Vitamin D  deficiency      Family History  Problem Relation Age of Onset   Heart disease Father    Hypertension Father    Diabetes Brother    Liver disease Neg Hx    Esophageal cancer Neg Hx    Colon cancer Neg Hx    Colon polyps Neg Hx    Rectal cancer Neg Hx    Stomach cancer Neg Hx      Social History   Socioeconomic History   Marital status: Married    Spouse name: Paul Murillo   Number of children: 2   Years of education: Not on file   Highest education level: 12th grade  Occupational History   Occupation: Retired  Tobacco Use   Smoking status: Former    Current packs/day: 0.00    Average packs/day: 0.5 packs/day for 50.0 years (25.0 ttl pk-yrs)    Types: Cigarettes    Start date: 12/31/1971    Quit date: 12/30/2021    Years since quitting: 2.2   Smokeless  tobacco: Never   Tobacco comments:    Quit 2021 - started back  Vaping Use   Vaping status: Never Used  Substance and Sexual Activity   Alcohol use: Yes    Alcohol/week: 0.0 standard drinks of alcohol    Comment: occ.   Drug use: No   Sexual activity: Yes  Other Topics Concern   Not on file  Social History Narrative   Children live in [place]   Social Drivers of Health   Financial Resource Strain: Low Risk  (12/20/2022)   Overall Financial Resource Strain (CARDIA)    Difficulty of Paying Living Expenses: Not hard at all  Food Insecurity: No Food Insecurity (12/20/2022)   Hunger Vital Sign    Worried About Running Out of Food in the Last Year: Never true    Ran Out of  Food in the Last Year: Never true  Transportation Needs: No Transportation Needs (12/20/2022)   PRAPARE - Administrator, Civil Service (Medical): No    Lack of Transportation (Non-Medical): No  Physical Activity: Insufficiently Active (12/20/2022)   Exercise Vital Sign    Days of Exercise per Week: 3 days    Minutes of Exercise per Session: 30 min  Stress: No Stress Concern Present (12/20/2022)   [employer] of Occupational Health - Occupational Stress Questionnaire    Feeling of Stress : Not at all  Social Connections: Moderately Integrated (12/20/2022)   Social Connection and Isolation Panel [NHANES]    Frequency of Communication with Friends and Family: More than three times a week    Frequency of Social Gatherings with Friends and Family: More than three times a week    Attends Religious Services: More than 4 times per year    Active Member of [employer] or Organizations: No    Attends Banker Meetings: Never    Marital Status: Married  Catering manager Violence: Not At Risk (12/20/2022)   Humiliation, Afraid, Rape, and Kick questionnaire    Fear of Current or Ex-Partner: No    Emotionally Abused: No    Physically Abused: No    Sexually Abused: No     No Known Allergies   Outpatient Medications Prior to Visit  Medication Sig Dispense Refill   albuterol  (VENTOLIN  HFA) 108 (90 Base) MCG/ACT inhaler INHALE 2 PUFFS INTO THE LUNGS EVERY 6 HOURS AS NEEDED (COUGH). 8.5 each 2   alfuzosin  (UROXATRAL ) 10 MG 24 hr tablet Take 1 tablet (10 mg total) by mouth daily with breakfast. 90 tablet 1   aspirin  81 MG EC tablet Take 81 mg by mouth daily.     atorvastatin  (LIPITOR) 80 MG tablet TAKE 1 TABLET BY MOUTH EVERY DAY 90 tablet 0   Budeson-Glycopyrrol-Formoterol (BREZTRI  AEROSPHERE) 160-9-4.8 MCG/ACT AERO Inhale 2 puffs into the lungs in the morning and at bedtime. 10.7 g 11   cetirizine  (ZYRTEC ) 10 MG tablet TAKE 1 TABLET BY MOUTH EVERYDAY AT BEDTIME 90 tablet 1    dapagliflozin propanediol (FARXIGA) 10 MG TABS tablet Take by mouth daily.     famotidine  (PEPCID ) 20 MG tablet Take 1 tablet (20 mg total) by mouth at bedtime. 30 tablet 11   fluconazole  (DIFLUCAN ) 100 MG tablet Take 2 tablets (200 mg total) by mouth daily for 1 day, THEN 1 tablet (100 mg total) daily for 21 days. 23 tablet 0   fluticasone  (FLONASE ) 50 MCG/ACT nasal spray PLACE 1 SPRAY INTO BOTH NOSTRILS 2 (TWO) TIMES DAILY AS NEEDED FOR ALLERGIES OR RHINITIS. 48 mL 1  levothyroxine  (SYNTHROID ) 75 MCG tablet TAKE 1 TABLET BY MOUTH EVERY DAY 90 tablet 2   metoprolol  succinate (TOPROL -XL) 25 MG 24 hr tablet TAKE 1 TABLET (25 MG TOTAL) BY MOUTH DAILY. 90 tablet 1   nitroGLYCERIN  (NITROSTAT ) 0.4 MG SL tablet Place 1 tablet (0.4 mg total) under the tongue every 5 (five) minutes as needed for chest pain. Reported on 04/01/2016 30 tablet 1   ondansetron  (ZOFRAN ) 4 MG tablet Take 1 tablet (4 mg total) by mouth every 6 (six) hours as needed for nausea or vomiting. Take every 6 hours during bowel preparation for nausea/vomiting 15 tablet 0   oxybutynin (DITROPAN-XL) 10 MG 24 hr tablet Take 1 tablet by mouth daily.     pantoprazole  (PROTONIX ) 40 MG tablet Take 1 tablet (40 mg total) by mouth daily. 30 tablet 11   tamsulosin  (FLOMAX ) 0.4 MG CAPS capsule Take 1 capsule (0.4 mg total) by mouth daily. 90 capsule 2   telmisartan  (MICARDIS ) 80 MG tablet Take 1 tablet (80 mg total) by mouth daily. 90 tablet 2   vardenafil  (LEVITRA ) 20 MG tablet Take 1 tablet (20 mg total) by mouth daily as needed for erectile dysfunction. 10 tablet 2   Vitamin D , Ergocalciferol , (DRISDOL ) 1.25 MG (50000 UNIT) CAPS capsule Take 1 capsule (50,000 Units total) by mouth every 7 (seven) days. 12 capsule 1   No facility-administered medications prior to visit.    Review of Systems  Constitutional:  Negative for chills, fever, malaise/fatigue and weight loss.  HENT:  Negative for congestion, sinus pain and sore throat.   Eyes: Negative.    Respiratory:  Positive for cough, sputum production and wheezing. Negative for hemoptysis and shortness of breath.   Cardiovascular:  Negative for chest pain, palpitations, orthopnea, claudication and leg swelling.  Gastrointestinal:  Positive for heartburn. Negative for abdominal pain, nausea and vomiting.  Genitourinary: Negative.   Musculoskeletal:  Negative for joint pain and myalgias.  Skin:  Negative for rash.  Neurological:  Negative for weakness.  Endo/Heme/Allergies: Negative.   Psychiatric/Behavioral: Negative.     Objective:   Vitals:   03/20/24 1404  BP: (!) 142/78  Pulse: 86  SpO2: 90%  Weight: 202 lb 12.8 oz (92 kg)  Height: 6\' 1"  (1.854 m)   Physical Exam Constitutional:      General: He is not in acute distress.    Appearance: Normal appearance.  Eyes:     General: No scleral icterus.    Conjunctiva/sclera: Conjunctivae normal.  Cardiovascular:     Rate and Rhythm: Normal rate and regular rhythm.  Pulmonary:     Breath sounds: Wheezing present. No rhonchi or rales.  Musculoskeletal:     Right lower leg: No edema.     Left lower leg: No edema.  Skin:    General: Skin is warm and dry.  Neurological:     General: No focal deficit present.    CBC    Component Value Date/Time   WBC 8.5 10/16/2023 1511   WBC 10.4 03/20/2018 0927   RBC 4.76 10/16/2023 1511   RBC 4.40 03/20/2018 0927   HGB 14.6 10/16/2023 1511   HCT 44.6 10/16/2023 1511   PLT 266 10/16/2023 1511   MCV 94 10/16/2023 1511   MCH 30.7 10/16/2023 1511   MCH 31.1 03/20/2018 0927   MCHC 32.7 10/16/2023 1511   MCHC 33.7 03/20/2018 0927   RDW 14.1 10/16/2023 1511   LYMPHSABS 2.6 10/16/2023 1511   MONOABS 0.5 04/09/2014 1130   EOSABS 0.5 (H)  10/16/2023 1511   BASOSABS 0.0 10/16/2023 1511      Latest Ref Rng & Units 10/16/2023    3:11 PM 09/08/2023   10:39 AM 03/24/2023    2:38 PM  BMP  Glucose 70 - 99 mg/dL 191  478  295   BUN 8 - 27 mg/dL 10  11  11    Creatinine 0.76 - 1.27 mg/dL  6.21  3.08  6.57   BUN/Creat Ratio 10 - 24 8  9  9    Sodium 134 - 144 mmol/L 141  139  140   Potassium 3.5 - 5.2 mmol/L 4.5  4.3  4.6   Chloride 96 - 106 mmol/L 105  100  104   CO2 20 - 29 mmol/L 22  21  23    Calcium  8.6 - 10.2 mg/dL 9.9  9.6  9.7    Chest imaging: CT Chest 09/16/23 1.  No acute process in the chest. 2. Aortic atherosclerosis (ICD10-I70.0), coronary artery atherosclerosis and emphysema (ICD10-J43.9). 3. Tiny hiatal hernia. Moderate esophageal dilatation with fluid level within. This suggests dysmotility and/or gastroesophageal reflux. This could predispose the patient to chronic cough. 4. Avascular necrosis of the left humeral head.  PFT:    Latest Ref Rng & Units 03/20/2024   12:40 PM  PFT Results  FVC-Pre L 2.29  P  FVC-Predicted Pre % 45  P  FVC-Post L 2.33  P  FVC-Predicted Post % 46  P  Pre FEV1/FVC % % 78  P  Post FEV1/FCV % % 83  P  FEV1-Pre L 1.79  P  FEV1-Predicted Pre % 48  P  FEV1-Post L 1.94  P  DLCO uncorrected ml/min/mmHg 19.11  P  DLCO UNC% % 66  P  DLCO corrected ml/min/mmHg 19.11  P  DLCO COR %Predicted % 66  P  DLVA Predicted % 93  P  TLC L 5.77  P  TLC % Predicted % 75  P  RV % Predicted % 122  P    P Preliminary result    Labs:  Path:  Echo:  Heart Catheterization:       Assessment & Plan:   COPD with chronic bronchitis and emphysema (HCC) - Plan: predniSONE  (DELTASONE ) 10 MG tablet, Pulse oximetry, overnight  Discussion: Zeb Rawl is a 68 year old male, recent former smoker with history of hypertension and GERD who returns to pulmonary clinic for COPD.  COPD with chronic bronchitis -Continue Breztri  inhaler (2 puffs BID). - Patient to look in to inhaler cost based on information from insurance and pharmacy -prescription sent for prednisone  given wheezing on exam - check overnight oxygen test on room air  Gastroesophageal Reflux Disease (GERD) with gastritis Candida Esophagitis S/p EGD 02/11/24 -Management  per GI, treated with diflucan  -Continue pantoprazole  and famotidine  for reflux management.  Tobacco Use Quit 6 months ago after long-term use. Eligible for lung cancer screening program. -Enroll in lung cancer screening program with annual CT scans.  Follow up in 6 months.  Duaine German, MD [facility] Office: [phone]    Current Outpatient Medications:    albuterol  (VENTOLIN  HFA) 108 (90 Base) MCG/ACT inhaler, INHALE 2 PUFFS INTO THE LUNGS EVERY 6 HOURS AS NEEDED (COUGH)., Disp: 8.5 each, Rfl: 2   alfuzosin  (UROXATRAL ) 10 MG 24 hr tablet, Take 1 tablet (10 mg total) by mouth daily with breakfast., Disp: 90 tablet, Rfl: 1   aspirin  81 MG EC tablet, Take 81 mg by mouth daily., Disp: , Rfl:  atorvastatin  (LIPITOR) 80 MG tablet, TAKE 1 TABLET BY MOUTH EVERY DAY, Disp: 90 tablet, Rfl: 0   Budeson-Glycopyrrol-Formoterol (BREZTRI  AEROSPHERE) 160-9-4.8 MCG/ACT AERO, Inhale 2 puffs into the lungs in the morning and at bedtime., Disp: 10.7 g, Rfl: 11   budesonide-glycopyrrolate-formoterol (BREZTRI  AEROSPHERE) 160-9-4.8 MCG/ACT AERO inhaler, 4 samples, Disp: , Rfl:    cetirizine  (ZYRTEC ) 10 MG tablet, TAKE 1 TABLET BY MOUTH EVERYDAY AT BEDTIME, Disp: 90 tablet, Rfl: 1   dapagliflozin propanediol (FARXIGA) 10 MG TABS tablet, Take by mouth daily., Disp: , Rfl:    famotidine  (PEPCID ) 20 MG tablet, Take 1 tablet (20 mg total) by mouth at bedtime., Disp: 30 tablet, Rfl: 11   fluconazole  (DIFLUCAN ) 100 MG tablet, Take 2 tablets (200 mg total) by mouth daily for 1 day, THEN 1 tablet (100 mg total) daily for 21 days., Disp: 23 tablet, Rfl: 0   fluticasone  (FLONASE ) 50 MCG/ACT nasal spray, PLACE 1 SPRAY INTO BOTH NOSTRILS 2 (TWO) TIMES DAILY AS NEEDED FOR ALLERGIES OR RHINITIS., Disp: 48 mL, Rfl: 1   levothyroxine  (SYNTHROID ) 75 MCG tablet, TAKE 1 TABLET BY MOUTH EVERY DAY, Disp: 90 tablet, Rfl: 2   metoprolol  succinate (TOPROL -XL) 25 MG 24 hr tablet, TAKE 1 TABLET (25 MG  TOTAL) BY MOUTH DAILY., Disp: 90 tablet, Rfl: 1   nitroGLYCERIN  (NITROSTAT ) 0.4 MG SL tablet, Place 1 tablet (0.4 mg total) under the tongue every 5 (five) minutes as needed for chest pain. Reported on 04/01/2016, Disp: 30 tablet, Rfl: 1   ondansetron  (ZOFRAN ) 4 MG tablet, Take 1 tablet (4 mg total) by mouth every 6 (six) hours as needed for nausea or vomiting. Take every 6 hours during bowel preparation for nausea/vomiting, Disp: 15 tablet, Rfl: 0   oxybutynin (DITROPAN-XL) 10 MG 24 hr tablet, Take 1 tablet by mouth daily., Disp: , Rfl:    pantoprazole  (PROTONIX ) 40 MG tablet, Take 1 tablet (40 mg total) by mouth daily., Disp: 30 tablet, Rfl: 11   predniSONE  (DELTASONE ) 10 MG tablet, Take 4 tablets (40 mg total) by mouth daily with breakfast., Disp: 20 tablet, Rfl: 0   tamsulosin  (FLOMAX ) 0.4 MG CAPS capsule, Take 1 capsule (0.4 mg total) by mouth daily., Disp: 90 capsule, Rfl: 2   telmisartan  (MICARDIS ) 80 MG tablet, Take 1 tablet (80 mg total) by mouth daily., Disp: 90 tablet, Rfl: 2   vardenafil  (LEVITRA ) 20 MG tablet, Take 1 tablet (20 mg total) by mouth daily as needed for erectile dysfunction., Disp: 10 tablet, Rfl: 2   Vitamin D , Ergocalciferol , (DRISDOL ) 1.25 MG (50000 UNIT) CAPS capsule, Take 1 capsule (50,000 Units total) by mouth every 7 (seven) days., Disp: 12 capsule, Rfl: 1

## 2024-03-20 NOTE — Patient Instructions (Signed)
 Full PFT performed today.

## 2024-03-20 NOTE — Patient Instructions (Addendum)
 Continue breztri  inhaler 2 puffs twice daily - rinse mouth out after each use  Use albuterol  inhaler 2 puffs twice daily  Call [facility] to check on price of inhaler based on information from your insurance company. If we need to send prescription to a different pharmacy please let us  know.  Your breathing tests show mixed obstructive and restrictive changes with decreased diffusion capacity  We will schedule you for overnight oxygen test  Continue pantoprazole  in the morning and famotidine  at bedtime for reflux  Follow up in 6 months

## 2024-03-21 ENCOUNTER — Encounter: Payer: Self-pay | Admitting: Pulmonary Disease

## 2024-03-21 ENCOUNTER — Telehealth: Payer: Self-pay

## 2024-03-21 DIAGNOSIS — K219 Gastro-esophageal reflux disease without esophagitis: Secondary | ICD-10-CM

## 2024-03-21 NOTE — Telephone Encounter (Signed)
 Copied from CRM [id number]. Topic: Clinical - Prescription Issue >> Mar 21, 2024  2:32 PM Paul Murillo O wrote: Reason for CRM: patient is calling cause for 1 doctor told me to check and see if they are still charging me a big amount every since i paid my duductible its 47 dollars its what i suppose to pay now for breztri . The other thing is got a notification on my paperwork from yesterday to continue taking pantoprazole  in the morning this one I have . And the famotidine  I dont have and it says to take this at bedtime . Paul Murillo Patient is needing the famotidine  sent to pharmacy if the doctor wants him to continue to  take it  [id number] - [place], [place] - [address] [place] [address] Phone: [phone] Fax: [phone] Hours: Not open 24 hours    Please send to this pharmacy and please give patient a call once prescription has been sent in  [phone]

## 2024-03-21 NOTE — Telephone Encounter (Signed)
 Spoke w/ PT [employer] approved verbalized understanding any further question reach out to us  or [employer]

## 2024-03-22 MED ORDER — FAMOTIDINE 20 MG PO TABS
20.0000 mg | ORAL_TABLET | Freq: Every day | ORAL | 5 refills | Status: AC
Start: 2024-03-22 — End: ?

## 2024-03-22 NOTE — Telephone Encounter (Signed)
 I have sent in refill for the famotidine .   JD

## 2024-03-28 ENCOUNTER — Other Ambulatory Visit: Payer: Self-pay | Admitting: Family

## 2024-03-28 DIAGNOSIS — E559 Vitamin D deficiency, unspecified: Secondary | ICD-10-CM

## 2024-03-29 ENCOUNTER — Other Ambulatory Visit: Payer: Self-pay

## 2024-04-04 DIAGNOSIS — R0902 Hypoxemia: Secondary | ICD-10-CM | POA: Diagnosis not present

## 2024-04-04 DIAGNOSIS — G4730 Sleep apnea, unspecified: Secondary | ICD-10-CM | POA: Diagnosis not present

## 2024-04-19 ENCOUNTER — Telehealth: Payer: Self-pay | Admitting: Pulmonary Disease

## 2024-04-19 NOTE — Telephone Encounter (Signed)
 ONO Results:  12 sec with SpO2 less than 88%. He does qualify for nightime time oxygen. Recommend 2L via nasal canula. Please place O2 orders if he is ok with starting therapy.  JD

## 2024-04-19 NOTE — Telephone Encounter (Signed)
 Spoke w/ PT he states he's doing well with the meds and wants to wait on the O2. Told him if he changes his mind to give us  a call back and will place order     NFN

## 2024-04-19 NOTE — Telephone Encounter (Signed)
 Lm for Pt to call back trying to give ono Results

## 2024-04-20 ENCOUNTER — Other Ambulatory Visit: Payer: Self-pay | Admitting: Family

## 2024-04-22 ENCOUNTER — Other Ambulatory Visit: Payer: Self-pay | Admitting: Family

## 2024-04-22 ENCOUNTER — Other Ambulatory Visit: Payer: Self-pay | Admitting: Family Medicine

## 2024-04-22 DIAGNOSIS — R053 Chronic cough: Secondary | ICD-10-CM

## 2024-04-22 DIAGNOSIS — R0982 Postnasal drip: Secondary | ICD-10-CM

## 2024-04-22 DIAGNOSIS — E559 Vitamin D deficiency, unspecified: Secondary | ICD-10-CM

## 2024-05-01 ENCOUNTER — Encounter: Payer: Self-pay | Admitting: Pulmonary Disease

## 2024-05-15 ENCOUNTER — Other Ambulatory Visit: Payer: Self-pay | Admitting: Family

## 2024-05-15 ENCOUNTER — Ambulatory Visit

## 2024-05-15 ENCOUNTER — Encounter: Payer: Self-pay | Admitting: Family

## 2024-05-15 NOTE — Telephone Encounter (Signed)
 Letter mailed

## 2024-05-15 NOTE — Telephone Encounter (Signed)
 Christy pt NTBS 30-d given 04/22/24

## 2024-05-19 ENCOUNTER — Other Ambulatory Visit: Payer: Self-pay | Admitting: Family

## 2024-06-11 ENCOUNTER — Other Ambulatory Visit: Payer: Self-pay | Admitting: Family

## 2024-06-11 MED ORDER — ALFUZOSIN HCL ER 10 MG PO TB24: 10.0000 mg | ORAL_TABLET | Freq: Every day | ORAL | 0 refills | Status: DC

## 2024-06-11 NOTE — Telephone Encounter (Signed)
 Made first available appt in Sept

## 2024-06-11 NOTE — Addendum Note (Signed)
 Addended by: Trichelle Lehan D on: 06/11/2024 12:06 PM   Modules accepted: Orders

## 2024-06-11 NOTE — Telephone Encounter (Signed)
 Paul Murillo pt NTBS 30-d given 05/20/24

## 2024-06-14 ENCOUNTER — Ambulatory Visit

## 2024-06-14 VITALS — BP 142/78 | HR 86 | Ht 73.0 in | Wt 202.0 lb

## 2024-06-14 DIAGNOSIS — Z0000 Encounter for general adult medical examination without abnormal findings: Secondary | ICD-10-CM

## 2024-06-14 NOTE — Patient Instructions (Addendum)
 Paul Murillo , Thank you for taking time out of your busy schedule to complete your Annual Wellness Visit with me. I enjoyed our conversation and look forward to speaking with you again next year. I, as well as your care team,  appreciate your ongoing commitment to your health goals. Please review the following plan we discussed and let me know if I can assist you in the future. Your Game plan/ To Do List    Referrals: If you haven't heard from the office you've been referred to, please reach out to them at the phone provided.   Follow up Visits: We will see or speak with you next year for your Next Medicare AWV with our clinical staff on 06/17/25 at 11:20a.m. Have you seen your provider in the last 6 months (3 months if uncontrolled diabetes)? Yes  Clinician Recommendations:  Aim for 30 minutes of exercise or brisk walking, 6-8 glasses of water, and 5 servings of fruits and vegetables each day.       This is a list of the screenings recommended for you:  Health Maintenance  Topic Date Due   COVID-19 Vaccine (8 - 2024-25 season) 06/25/2023   Cologuard (Stool DNA test)  12/11/2023   Medicare Annual Wellness Visit  12/21/2023   Flu Shot  05/24/2024   DTaP/Tdap/Td vaccine (5 - Td or Tdap) 10/15/2024*   Screening for Lung Cancer  09/15/2024   Colon Cancer Screening  02/20/2034   Pneumococcal Vaccine for age over 33  Completed   Hepatitis C Screening  Completed   Zoster (Shingles) Vaccine  Completed   HPV Vaccine  Aged Out   Meningitis B Vaccine  Aged Out  *Topic was postponed. The date shown is not the original due date.    Advanced directives: (Declined) Advance directive discussed with you today. Even though you declined this today, please call our office should you change your mind, and we can give you the proper paperwork for you to fill out. Advance Care Planning is important because it:  [x]  Makes sure you receive the medical care that is consistent with your values, goals, and  preferences  [x]  It provides guidance to your family and loved ones and reduces their decisional burden about whether or not they are making the right decisions based on your wishes.  Follow the link provided in your after visit summary or read over the paperwork we have mailed to you to help you started getting your Advance Directives in place. If you need assistance in completing these, please reach out to us  so that we can help you!  See attachments for Preventive Care and Fall Prevention Tips.

## 2024-06-14 NOTE — Progress Notes (Signed)
 Subjective:   Paul Murillo is a 68 y.o. who presents for a Medicare Wellness preventive visit.  As a reminder, Annual Wellness Visits don't include a physical exam, and some assessments may be limited, especially if this visit is performed virtually. We may recommend an in-person follow-up visit with your provider if needed.  Visit Complete: Virtual I connected with  Paul Murillo on 06/14/24 by a audio enabled telemedicine application and verified that I am speaking with the correct person using two identifiers.  Patient Location: Home  Provider Location: Home Office  I discussed the limitations of evaluation and management by telemedicine. The patient expressed understanding and agreed to proceed.  Vital Signs: Because this visit was a virtual/telehealth visit, some criteria may be missing or patient reported. Any vitals not documented were not able to be obtained and vitals that have been documented are patient reported.  VideoDeclined- This patient declined Librarian, academic. Therefore the visit was completed with audio only.  Persons Participating in Visit: Patient.  AWV Questionnaire: No: Patient Medicare AWV questionnaire was not completed prior to this visit.  Cardiac Risk Factors include: advanced age (>34men, >48 women);dyslipidemia;hypertension;smoking/ tobacco exposure     Objective:    Today's Vitals   06/14/24 1117  BP: (!) 142/78  Pulse: 86  Weight: 202 lb (91.6 kg)  Height: 6' 1 (1.854 m)   Body mass index is 26.65 kg/m.     06/14/2024   11:20 AM 12/20/2022   11:19 AM 11/25/2021    2:32 PM 11/24/2020    3:13 PM 03/20/2018    8:49 AM 11/18/2016    9:09 AM 07/06/2016   10:33 AM  Advanced Directives  Does Patient Have a Medical Advance Directive? No No No Yes No  No  No   Type of Advance Directive    Healthcare Power of Attorney     Does patient want to make changes to medical advance directive?    No - Patient declined     Copy of  Healthcare Power of Attorney in Chart?    No - copy requested     Would patient like information on creating a medical advance directive?  No - Patient declined No - Patient declined   No - Patient declined  No - patient declined information      Data saved with a previous flowsheet row definition    Current Medications (verified) Outpatient Encounter Medications as of 06/14/2024  Medication Sig   albuterol  (VENTOLIN  HFA) 108 (90 Base) MCG/ACT inhaler INHALE 2 PUFFS INTO THE LUNGS EVERY 6 HOURS AS NEEDED (COUGH).   alfuzosin  (UROXATRAL ) 10 MG 24 hr tablet Take 1 tablet (10 mg total) by mouth daily with breakfast.   aspirin  81 MG EC tablet Take 81 mg by mouth daily.   atorvastatin  (LIPITOR) 80 MG tablet TAKE 1 TABLET (80 MG TOTAL) BY MOUTH DAILY. **NEEDS TO BE SEEN BEFORE NEXT REFILL**   Budeson-Glycopyrrol-Formoterol (BREZTRI  AEROSPHERE) 160-9-4.8 MCG/ACT AERO Inhale 2 puffs into the lungs in the morning and at bedtime.   budesonide-glycopyrrolate-formoterol (BREZTRI  AEROSPHERE) 160-9-4.8 MCG/ACT AERO inhaler 4 samples   cetirizine  (ZYRTEC ) 10 MG tablet TAKE 1 TABLET BY MOUTH EVERYDAY AT BEDTIME   dapagliflozin propanediol (FARXIGA) 10 MG TABS tablet Take by mouth daily.   famotidine  (PEPCID ) 20 MG tablet Take 1 tablet (20 mg total) by mouth at bedtime.   fluticasone  (FLONASE ) 50 MCG/ACT nasal spray PLACE 1 SPRAY INTO BOTH NOSTRILS 2 (TWO) TIMES DAILY AS NEEDED FOR ALLERGIES OR  RHINITIS.   levothyroxine  (SYNTHROID ) 75 MCG tablet TAKE 1 TABLET BY MOUTH EVERY DAY   metoprolol  succinate (TOPROL -XL) 25 MG 24 hr tablet TAKE 1 TABLET (25 MG TOTAL) BY MOUTH DAILY.   nitroGLYCERIN  (NITROSTAT ) 0.4 MG SL tablet Place 1 tablet (0.4 mg total) under the tongue every 5 (five) minutes as needed for chest pain. Reported on 04/01/2016   ondansetron  (ZOFRAN ) 4 MG tablet Take 1 tablet (4 mg total) by mouth every 6 (six) hours as needed for nausea or vomiting. Take every 6 hours during bowel preparation for  nausea/vomiting   oxybutynin (DITROPAN-XL) 10 MG 24 hr tablet Take 1 tablet by mouth daily.   pantoprazole  (PROTONIX ) 40 MG tablet Take 1 tablet (40 mg total) by mouth daily.   predniSONE  (DELTASONE ) 10 MG tablet Take 4 tablets (40 mg total) by mouth daily with breakfast.   tamsulosin  (FLOMAX ) 0.4 MG CAPS capsule Take 1 capsule (0.4 mg total) by mouth daily.   telmisartan  (MICARDIS ) 80 MG tablet Take 1 tablet (80 mg total) by mouth daily.   vardenafil  (LEVITRA ) 20 MG tablet Take 1 tablet (20 mg total) by mouth daily as needed for erectile dysfunction.   Vitamin D , Ergocalciferol , (DRISDOL ) 1.25 MG (50000 UNIT) CAPS capsule TAKE 1 CAPSULE (50,000 UNITS TOTAL) BY MOUTH EVERY 7 (SEVEN) DAYS   No facility-administered encounter medications on file as of 06/14/2024.    Allergies (verified) Patient has no known allergies.   History: Past Medical History:  Diagnosis Date   Cancer [facility])    Prostate   Coronary artery disease    Depression    ED (erectile dysfunction)    Genitourinary disorder    GERD (gastroesophageal reflux disease)    Headache    Hypercholesteremia    Hypertension    MI (myocardial infarction) (HCC)    Pneumonia    Reflux    Syncope    Vitamin D  deficiency    Past Surgical History:  Procedure Laterality Date   APPENDECTOMY     HERNIA REPAIR     LEFT HEART CATHETERIZATION WITH CORONARY ANGIOGRAM N/A 01/29/2014   Procedure: LEFT HEART CATHETERIZATION WITH CORONARY ANGIOGRAM;  Surgeon: Peter M [place], MD;  [facility] C[facility] LAB;  Service: Cardiovascular;  Laterality: N/A;   TOTAL HIP ARTHROPLASTY     Family History  Problem Relation Age of Onset   Heart disease Father    Hypertension Father    Diabetes Brother    Liver disease Neg Hx    Esophageal cancer Neg Hx    Colon cancer Neg Hx    Colon polyps Neg Hx    Rectal cancer Neg Hx    Stomach cancer Neg Hx    Social History   Socioeconomic History   Marital status: Married    Spouse name: Romero   Number  of children: 2   Years of education: Not on file   Highest education level: 12th grade  Occupational History   Occupation: Retired  Tobacco Use   Smoking status: Former    Current packs/day: 0.00    Average packs/day: 0.5 packs/day for 50.0 years (25.0 ttl pk-yrs)    Types: Cigarettes    Start date: 12/31/1971    Quit date: 12/30/2021    Years since quitting: 2.4   Smokeless tobacco: Never   Tobacco comments:    Quit 2021 - started back  Vaping Use   Vaping status: Never Used  Substance and Sexual Activity   Alcohol use: Yes    Alcohol/week: 0.0 standard  drinks of alcohol    Comment: occ.   Drug use: No   Sexual activity: Yes  Other Topics Concern   Not on file  Social History Narrative   Children live in [place]   Social Drivers of Health   Financial Resource Strain: Low Risk  (06/14/2024)   Overall Financial Resource Strain (CARDIA)    Difficulty of Paying Living Expenses: Not hard at all  Food Insecurity: No Food Insecurity (06/14/2024)   Hunger Vital Sign    Worried About Running Out of Food in the Last Year: Never true    Ran Out of Food in the Last Year: Never true  Transportation Needs: No Transportation Needs (06/14/2024)   PRAPARE - Administrator, Civil Service (Medical): No    Lack of Transportation (Non-Medical): No  Physical Activity: Inactive (06/14/2024)   Exercise Vital Sign    Days of Exercise per Week: 0 days    Minutes of Exercise per Session: 0 min  Stress: No Stress Concern Present (06/14/2024)   [employer] of Occupational Health - Occupational Stress Questionnaire    Feeling of Stress: Not at all  Social Connections: Socially Integrated (06/14/2024)   Social Connection and Isolation Panel    Frequency of Communication with Friends and Family: More than three times a week    Frequency of Social Gatherings with Friends and Family: More than three times a week    Attends Religious Services: More than 4 times per year    Active Member of  [employer] or Organizations: Yes    Attends Engineer, structural: More than 4 times per year    Marital Status: Married    Tobacco Counseling Counseling given: Yes Tobacco comments: Quit 2021 - started back    Clinical Intake:  Pre-visit preparation completed: Yes  Pain : No/denies pain     BMI - recorded: 26.65 Nutritional Status: BMI 25 -29 Overweight Nutritional Risks: None Diabetes: No  Lab Results  Component Value Date   HGBA1C 6.3 (H) 10/16/2023   HGBA1C 5.8 (H) 03/29/2023   HGBA1C 5.8 (H) 11/18/2019     How often do you need to have someone help you when you read instructions, pamphlets, or other written materials from your doctor or pharmacy?: 1 - Never  Interpreter Needed?: No  Information entered by :: alia t/cma   Activities of Daily Living     06/14/2024   11:19 AM  In your present state of health, do you have any difficulty performing the following activities:  Hearing? 0  Vision? 0  Difficulty concentrating or making decisions? 0  Walking or climbing stairs? 0  Dressing or bathing? 0  Doing errands, shopping? 0  Preparing Food and eating ? N  Using the Toilet? N  In the past six months, have you accidently leaked urine? N  Do you have problems with loss of bowel control? N  Managing your Medications? N  Managing your Finances? N  Housekeeping or managing your Housekeeping? N    Patient Care Team: Lavell Bari LABOR, FNP as PCP - General (Family Medicine) Devere Lonni Righter, MD as Consulting Physician (Urology) Epifanio Alm HERO, MD as Referring Physician (Cardiology)  I have updated your Care Teams any recent Medical Services you may have received from other providers in the past year.     Assessment:   This is a routine wellness examination for Paul Murillo.  Hearing/Vision screen Hearing Screening - Comments:: Pt denies hearing dif Vision Screening - Comments:: Pt wear glasses/pt  has not been to an eye apt for about  106yrs/suggest pt to get an eye apt soon   Goals Addressed             This Visit's Progress    AWV   On track    11/24/2020 AWV Goal: Exercise for General Health  Patient will verbalize understanding of the benefits of increased physical activity: Exercising regularly is important. It will improve your overall fitness, flexibility, and endurance. Regular exercise also will improve your overall health. It can help you control your weight, reduce stress, and improve your bone density. Over the next year, patient will increase physical activity as tolerated with a goal of at least 150 minutes of moderate physical activity per week.  You can tell that you are exercising at a moderate intensity if your heart starts beating faster and you start breathing faster but can still hold a conversation. Moderate-intensity exercise ideas include: Walking 1 mile (1.6 km) in about 15 minutes Biking Hiking Golfing Dancing Water aerobics Patient will verbalize understanding of everyday activities that increase physical activity by providing examples like the following: Yard work, such as: Insurance underwriter Gardening Washing windows or floors Patient will be able to explain general safety guidelines for exercising:  Before you start a new exercise program, talk with your health care provider. Do not exercise so much that you hurt yourself, feel dizzy, or get very short of breath. Wear comfortable clothes and wear shoes with good support. Drink plenty of water while you exercise to prevent dehydration or heat stroke. Work out until your breathing and your heartbeat get faster.        Depression Screen     06/14/2024   11:28 AM 10/16/2023    2:27 PM 09/11/2023   10:27 AM 09/08/2023    9:58 AM 08/18/2023    2:57 PM 07/03/2023    2:00 PM 03/24/2023    1:46 PM  PHQ 2/9 Scores  PHQ - 2 Score 1 1 2 2  0 2 2  PHQ- 9  Score 3 6 6 13  0 3 3    Fall Risk     06/14/2024   11:18 AM 03/20/2024    2:04 PM 12/20/2023    2:18 PM 10/16/2023    2:27 PM 09/11/2023   10:27 AM  Fall Risk   Falls in the past year? 0 0 1 0 0  Number falls in past yr: 0  0  0  Injury with Fall? 0  0  0  Risk for fall due to : No Fall Risks  Impaired balance/gait No Fall Risks No Fall Risks  Follow up Falls evaluation completed   Falls evaluation completed Education provided    MEDICARE RISK AT HOME:  Medicare Risk at Home Any stairs in or around the home?: No If so, are there any without handrails?: No Home free of loose throw rugs in walkways, pet beds, electrical cords, etc?: Yes Adequate lighting in your home to reduce risk of falls?: Yes Life alert?: No Use of a cane, walker or w/c?: No Grab bars in the bathroom?: Yes Shower chair or bench in shower?: Yes Elevated toilet seat or a handicapped toilet?: Yes  TIMED UP AND GO:  Was the test performed?  no  Cognitive Function: 6CIT completed        06/14/2024   11:30 AM 12/20/2022   11:20 AM 11/25/2021    2:46  PM  6CIT Screen  What Year? 0 points 0 points 0 points  What month? 0 points 0 points 0 points  What time? 0 points 0 points 0 points  Count back from 20 0 points 0 points 0 points  Months in reverse 0 points 0 points 0 points  Repeat phrase 0 points 0 points 0 points  Total Score 0 points 0 points 0 points    Immunizations Immunization History  Administered Date(s) Administered   DTaP 01/04/2010   Fluad Quad(high Dose 65+) 09/22/2022   Influenza Whole 08/12/2008   Influenza, High Dose Seasonal PF 08/01/2019, 11/22/2021, 08/11/2023   Influenza,inj,Quad PF,6+ Mos 08/05/2014, 07/30/2015, 08/25/2016, 08/16/2017, 08/10/2018   Influenza-Unspecified 08/08/2018, 08/08/2020   PFIZER Comirnaty(Gray Top)Covid-19 Tri-Sucrose Vaccine 01/04/2020, 01/25/2020, 08/08/2020   PFIZER(Purple Top)SARS-COV-2 Vaccination 01/04/2020, 01/25/2020, 08/08/2020   Pfizer Covid-19  Vaccine Bivalent Booster 87yrs & up 11/22/2021   Pneumococcal Conjugate-13 11/19/2020   Pneumococcal Polysaccharide-23 05/16/2019   Td 01/04/2010   Td (Adult),5 Lf Tetanus Toxid, Preservative Free 01/04/2010   Tdap 01/04/2010   Zoster Recombinant(Shingrix ) 09/22/2022, 12/13/2022   Zoster, Live 02/22/2016    Screening Tests Health Maintenance  Topic Date Due   COVID-19 Vaccine (8 - 2024-25 season) 06/25/2023   Fecal DNA (Cologuard)  12/11/2023   INFLUENZA VACCINE  05/24/2024   DTaP/Tdap/Td (5 - Td or Tdap) 10/15/2024 (Originally 01/05/2020)   Lung Cancer Screening  09/15/2024   Medicare Annual Wellness (AWV)  06/14/2025   Colonoscopy  02/20/2034   Pneumococcal Vaccine: 50+ Years  Completed   Hepatitis C Screening  Completed   Zoster Vaccines- Shingrix   Completed   HPV VACCINES  Aged Out   Meningococcal B Vaccine  Aged Out    Health Maintenance  Health Maintenance Due  Topic Date Due   COVID-19 Vaccine (8 - 2024-25 season) 06/25/2023   Fecal DNA (Cologuard)  12/11/2023   INFLUENZA VACCINE  05/24/2024   Health Maintenance Items Addressed: See Nurse Notes at the end of this note  Additional Screening:  Vision Screening: Recommended annual ophthalmology exams for early detection of glaucoma and other disorders of the eye. Would you like a referral to an eye doctor? No    Dental Screening: Recommended annual dental exams for proper oral hygiene  Community Resource Referral / Chronic Care Management: CRR required this visit?  No   CCM required this visit?  No   Plan:    I have personally reviewed and noted the following in the patient's chart:   Medical and social history Use of alcohol, tobacco or illicit drugs  Current medications and supplements including opioid prescriptions. Patient is not currently taking opioid prescriptions. Functional ability and status Nutritional status Physical activity Advanced directives List of other physicians Hospitalizations,  surgeries, and ER visits in previous 12 months Vitals Screenings to include cognitive, depression, and falls Referrals and appointments  In addition, I have reviewed and discussed with patient certain preventive protocols, quality metrics, and best practice recommendations. A written personalized care plan for preventive services as well as general preventive health recommendations were provided to patient.   Ozie Ned, CMA   06/14/2024   After Visit Summary: (MyChart) Due to this being a telephonic visit, the after visit summary with patients personalized plan was offered to patient via MyChart   Notes: Nothing significant to report at this time.

## 2024-06-16 ENCOUNTER — Other Ambulatory Visit: Payer: Self-pay | Admitting: Family

## 2024-06-27 DIAGNOSIS — R112 Nausea with vomiting, unspecified: Secondary | ICD-10-CM | POA: Diagnosis not present

## 2024-07-09 ENCOUNTER — Ambulatory Visit: Admitting: Family

## 2024-07-09 DIAGNOSIS — I2510 Atherosclerotic heart disease of native coronary artery without angina pectoris: Secondary | ICD-10-CM

## 2024-07-09 DIAGNOSIS — I10 Essential (primary) hypertension: Secondary | ICD-10-CM

## 2024-07-09 DIAGNOSIS — N401 Enlarged prostate with lower urinary tract symptoms: Secondary | ICD-10-CM

## 2024-07-09 DIAGNOSIS — R053 Chronic cough: Secondary | ICD-10-CM

## 2024-07-15 ENCOUNTER — Other Ambulatory Visit: Payer: Self-pay | Admitting: Family

## 2024-07-15 DIAGNOSIS — N401 Enlarged prostate with lower urinary tract symptoms: Secondary | ICD-10-CM

## 2024-07-17 ENCOUNTER — Other Ambulatory Visit: Payer: Self-pay | Admitting: Family

## 2024-07-22 ENCOUNTER — Other Ambulatory Visit: Payer: Self-pay | Admitting: Family

## 2024-07-30 ENCOUNTER — Telehealth: Payer: Self-pay

## 2024-07-30 NOTE — Telephone Encounter (Signed)
 [place] &[place] paper [place]

## 2024-08-08 ENCOUNTER — Other Ambulatory Visit: Payer: Self-pay | Admitting: Family

## 2024-08-08 DIAGNOSIS — N401 Enlarged prostate with lower urinary tract symptoms: Secondary | ICD-10-CM

## 2024-08-08 NOTE — Telephone Encounter (Signed)
 Christy pt NTBS 30-d given 07/15/24

## 2024-08-09 NOTE — Telephone Encounter (Signed)
Left message for pt to schedule appt to get meds refilled

## 2024-08-10 ENCOUNTER — Other Ambulatory Visit: Payer: Self-pay | Admitting: Family

## 2024-08-10 DIAGNOSIS — N401 Enlarged prostate with lower urinary tract symptoms: Secondary | ICD-10-CM

## 2024-08-13 ENCOUNTER — Encounter: Payer: Self-pay | Admitting: Family

## 2024-08-13 ENCOUNTER — Ambulatory Visit (INDEPENDENT_AMBULATORY_CARE_PROVIDER_SITE_OTHER): Admitting: Family

## 2024-08-13 VITALS — BP 126/82 | HR 61 | Temp 97.4°F | Ht 73.0 in | Wt 209.6 lb

## 2024-08-13 DIAGNOSIS — Z9861 Coronary angioplasty status: Secondary | ICD-10-CM | POA: Diagnosis not present

## 2024-08-13 DIAGNOSIS — Z0001 Encounter for general adult medical examination with abnormal findings: Secondary | ICD-10-CM

## 2024-08-13 DIAGNOSIS — R7303 Prediabetes: Secondary | ICD-10-CM | POA: Diagnosis not present

## 2024-08-13 DIAGNOSIS — N1831 Chronic kidney disease, stage 3a: Secondary | ICD-10-CM

## 2024-08-13 DIAGNOSIS — I2510 Atherosclerotic heart disease of native coronary artery without angina pectoris: Secondary | ICD-10-CM

## 2024-08-13 DIAGNOSIS — I110 Hypertensive heart disease with heart failure: Secondary | ICD-10-CM | POA: Diagnosis not present

## 2024-08-13 DIAGNOSIS — I10 Essential (primary) hypertension: Secondary | ICD-10-CM | POA: Diagnosis not present

## 2024-08-13 DIAGNOSIS — E559 Vitamin D deficiency, unspecified: Secondary | ICD-10-CM

## 2024-08-13 DIAGNOSIS — Z0000 Encounter for general adult medical examination without abnormal findings: Secondary | ICD-10-CM | POA: Diagnosis not present

## 2024-08-13 DIAGNOSIS — E782 Mixed hyperlipidemia: Secondary | ICD-10-CM | POA: Diagnosis not present

## 2024-08-13 DIAGNOSIS — E039 Hypothyroidism, unspecified: Secondary | ICD-10-CM | POA: Diagnosis not present

## 2024-08-13 DIAGNOSIS — N401 Enlarged prostate with lower urinary tract symptoms: Secondary | ICD-10-CM | POA: Diagnosis not present

## 2024-08-13 DIAGNOSIS — R351 Nocturia: Secondary | ICD-10-CM

## 2024-08-13 DIAGNOSIS — Z23 Encounter for immunization: Secondary | ICD-10-CM

## 2024-08-13 LAB — BAYER DCA HB A1C WAIVED: HB A1C (BAYER DCA - WAIVED): 5.9 % — ABNORMAL HIGH (ref 4.8–5.6)

## 2024-08-13 LAB — LIPID PANEL

## 2024-08-13 MED ORDER — ALFUZOSIN HCL ER 10 MG PO TB24: 10.0000 mg | ORAL_TABLET | Freq: Every day | ORAL | 0 refills | Status: AC

## 2024-08-13 MED ORDER — METOPROLOL SUCCINATE ER 25 MG PO TB24: 25.0000 mg | ORAL_TABLET | Freq: Every day | ORAL | 1 refills | Status: AC

## 2024-08-13 MED ORDER — ATORVASTATIN CALCIUM 80 MG PO TABS: 80.0000 mg | ORAL_TABLET | Freq: Every day | ORAL | 1 refills | Status: AC

## 2024-08-13 MED ORDER — TELMISARTAN 80 MG PO TABS: 80.0000 mg | ORAL_TABLET | Freq: Every day | ORAL | 2 refills | Status: AC

## 2024-08-13 MED ORDER — TAMSULOSIN HCL 0.4 MG PO CAPS: 0.4000 mg | ORAL_CAPSULE | Freq: Every day | ORAL | 0 refills | Status: DC

## 2024-08-13 NOTE — Patient Instructions (Signed)
 Health Maintenance After Age 70 After age 27, you are at a higher risk for certain long-term diseases and infections as well as injuries from falls. Falls are a major cause of broken bones and head injuries in people who are older than age 73. Getting regular preventive care can help to keep you healthy and well. Preventive care includes getting regular testing and making lifestyle changes as recommended by your health care provider. Talk with your health care provider about: Which screenings and tests you should have. A screening is a test that checks for a disease when you have no symptoms. A diet and exercise plan that is right for you. What should I know about screenings and tests to prevent falls? Screening and testing are the best ways to find a health problem early. Early diagnosis and treatment give you the best chance of managing medical conditions that are common after age 90. Certain conditions and lifestyle choices may make you more likely to have a fall. Your health care provider may recommend: Regular vision checks. Poor vision and conditions such as cataracts can make you more likely to have a fall. If you wear glasses, make sure to get your prescription updated if your vision changes. Medicine review. Work with your health care provider to regularly review all of the medicines you are taking, including over-the-counter medicines. Ask your health care provider about any side effects that may make you more likely to have a fall. Tell your health care provider if any medicines that you take make you feel dizzy or sleepy. Strength and balance checks. Your health care provider may recommend certain tests to check your strength and balance while standing, walking, or changing positions. Foot health exam. Foot pain and numbness, as well as not wearing proper footwear, can make you more likely to have a fall. Screenings, including: Osteoporosis screening. Osteoporosis is a condition that causes  the bones to get weaker and break more easily. Blood pressure screening. Blood pressure changes and medicines to control blood pressure can make you feel dizzy. Depression screening. You may be more likely to have a fall if you have a fear of falling, feel depressed, or feel unable to do activities that you used to do. Alcohol  use screening. Using too much alcohol  can affect your balance and may make you more likely to have a fall. Follow these instructions at home: Lifestyle Do not drink alcohol  if: Your health care provider tells you not to drink. If you drink alcohol : Limit how much you have to: 0-1 drink a day for women. 0-2 drinks a day for men. Know how much alcohol  is in your drink. In the [place] one drink equals one 12 oz bottle of beer (355 mL), one 5 oz glass of wine (148 mL), or one 1 oz glass of hard liquor (44 mL). Do not use any products that contain nicotine or tobacco. These products include cigarettes, chewing tobacco, and vaping devices, such as e-cigarettes. If you need help quitting, ask your health care provider. Activity  Follow a regular exercise program to stay fit. This will help you maintain your balance. Ask your health care provider what types of exercise are appropriate for you. If you need a cane or walker, use it as recommended by your health care provider. Wear supportive shoes that have nonskid soles. Safety  Remove any tripping hazards, such as rugs, cords, and clutter. Install safety equipment such as grab bars in bathrooms and safety rails on stairs. Keep rooms and walkways  well-lit. General instructions Talk with your health care provider about your risks for falling. Tell your health care provider if: You fall. Be sure to tell your health care provider about all falls, even ones that seem minor. You feel dizzy, tiredness (fatigue), or off-balance. Take over-the-counter and prescription medicines only as told by your health care provider. These include  supplements. Eat a healthy diet and maintain a healthy weight. A healthy diet includes low-fat dairy products, low-fat (lean) meats, and fiber from whole grains, beans, and lots of fruits and vegetables. Stay current with your vaccines. Schedule regular health, dental, and eye exams. Summary Having a healthy lifestyle and getting preventive care can help to protect your health and wellness after age 15. Screening and testing are the best way to find a health problem early and help you avoid having a fall. Early diagnosis and treatment give you the best chance for managing medical conditions that are more common for people who are older than age 42. Falls are a major cause of broken bones and head injuries in people who are older than age 64. Take precautions to prevent a fall at home. Work with your health care provider to learn what changes you can make to improve your health and wellness and to prevent falls. This information is not intended to replace advice given to you by your health care provider. Make sure you discuss any questions you have with your health care provider. Document Revised: 03/01/2021 Document Reviewed: 03/01/2021 Elsevier Patient Education  2024 [employer].

## 2024-08-13 NOTE — Progress Notes (Signed)
 Subjective:    Patient ID: Paul Murillo, male    DOB: [birth year], 68 y.o.   MRN: [id number]  Chief Complaint  Patient presents with   Medical Management of Chronic Issues   Pt presents to the office today for CPE and chronic follow up .   Pt is followed by Urologists annually for Prostate Cancer.  PT states he is followed by Cardiologists annually for CAD.   He has CKD and limits NSAID's. Stable.  Hypertension This is a chronic problem. The current episode started more than 1 year ago. The problem has been waxing and waning since onset. The problem is uncontrolled. Pertinent negatives include no blurred vision, malaise/fatigue, peripheral edema or shortness of breath. Risk factors for coronary artery disease include dyslipidemia, diabetes mellitus, male gender and sedentary lifestyle. The current treatment provides moderate improvement. Identifiable causes of hypertension include a thyroid  problem.  Thyroid  Problem Presents for follow-up visit. Symptoms include constipation. Patient reports no anxiety, dry skin or fatigue. The symptoms have been stable.  Benign Prostatic Hypertrophy This is a chronic problem. The current episode started more than 1 year ago. Irritative symptoms include nocturia (3). Past treatments include tamsulosin  and finasteride. The treatment provided mild relief.  Hyperlipidemia This is a chronic problem. The current episode started more than 1 year ago. The problem is controlled. Recent lipid tests were reviewed and are normal. Pertinent negatives include no shortness of breath. Current antihyperlipidemic treatment includes statins. The current treatment provides moderate improvement of lipids. Risk factors for coronary artery disease include dyslipidemia, hypertension, a sedentary lifestyle and male sex.  Diabetes Diabetes type: prediabetes. Pertinent negatives for hypoglycemia include no nervousness/anxiousness. Pertinent negatives for diabetes include no blurred  vision and no fatigue. (Does not check glucose at home)      Review of Systems  Constitutional:  Negative for fatigue and malaise/fatigue.  Eyes:  Negative for blurred vision.  Respiratory:  Negative for shortness of breath.   Gastrointestinal:  Positive for constipation.  Genitourinary:  Positive for nocturia (3).  Psychiatric/Behavioral:  The patient is not nervous/anxious.   All other systems reviewed and are negative.   Social History   Socioeconomic History   Marital status: Married    Spouse name: Romero   Number of children: 2   Years of education: Not on file   Highest education level: 12th grade  Occupational History   Occupation: Retired  Tobacco Use   Smoking status: Former    Current packs/day: 0.00    Average packs/day: 0.5 packs/day for 50.0 years (25.0 ttl pk-yrs)    Types: Cigarettes    Start date: 12/31/1971    Quit date: 12/30/2021    Years since quitting: 2.6   Smokeless tobacco: Never   Tobacco comments:    Quit 2021 - started back  Vaping Use   Vaping status: Never Used  Substance and Sexual Activity   Alcohol use: Yes    Alcohol/week: 0.0 standard drinks of alcohol    Comment: occ.   Drug use: No   Sexual activity: Yes  Other Topics Concern   Not on file  Social History Narrative   Children live in [place]   Social Drivers of Health   Financial Resource Strain: Low Risk  (06/14/2024)   Overall Financial Resource Strain (CARDIA)    Difficulty of Paying Living Expenses: Not hard at all  Food Insecurity: No Food Insecurity (06/14/2024)   Hunger Vital Sign    Worried About Running Out of Food in the Last Year:  Never true    Ran Out of Food in the Last Year: Never true  Transportation Needs: No Transportation Needs (06/14/2024)   PRAPARE - Administrator, Civil Service (Medical): No    Lack of Transportation (Non-Medical): No  Physical Activity: Inactive (06/14/2024)   Exercise Vital Sign    Days of Exercise per Week: 0 days    Minutes  of Exercise per Session: 0 min  Stress: No Stress Concern Present (06/14/2024)   [employer] of Occupational Health - Occupational Stress Questionnaire    Feeling of Stress: Not at all  Social Connections: Socially Integrated (06/14/2024)   Social Connection and Isolation Panel    Frequency of Communication with Friends and Family: More than three times a week    Frequency of Social Gatherings with Friends and Family: More than three times a week    Attends Religious Services: More than 4 times per year    Active Member of [employer] or Organizations: Yes    Attends Engineer, structural: More than 4 times per year    Marital Status: Married   Family History  Problem Relation Age of Onset   Heart disease Father    Hypertension Father    Diabetes Brother    Liver disease Neg Hx    Esophageal cancer Neg Hx    Colon cancer Neg Hx    Colon polyps Neg Hx    Rectal cancer Neg Hx    Stomach cancer Neg Hx         Objective:   Physical Exam Vitals reviewed.  Constitutional:      General: He is not in acute distress.    Appearance: He is well-developed.  HENT:     Head: Normocephalic.     Right Ear: Tympanic membrane normal.     Left Ear: Tympanic membrane normal.  Eyes:     General:        Right eye: No discharge.        Left eye: No discharge.     Pupils: Pupils are equal, round, and reactive to light.  Neck:     Thyroid : No thyromegaly.  Cardiovascular:     Rate and Rhythm: Normal rate and regular rhythm.     Heart sounds: Normal heart sounds. No murmur heard. Pulmonary:     Effort: Pulmonary effort is normal. No respiratory distress.     Breath sounds: Normal breath sounds. No wheezing.  Abdominal:     General: Bowel sounds are normal. There is no distension.     Palpations: Abdomen is soft.     Tenderness: There is no abdominal tenderness.  Musculoskeletal:        General: No tenderness. Normal range of motion.     Cervical back: Normal range of motion and  neck supple.  Skin:    General: Skin is warm and dry.     Findings: No erythema or rash.  Neurological:     Mental Status: He is alert and oriented to person, place, and time.     Cranial Nerves: No cranial nerve deficit.     Deep Tendon Reflexes: Reflexes are normal and symmetric.  Psychiatric:        Behavior: Behavior normal.        Thought Content: Thought content normal.        Judgment: Judgment normal.       BP 126/82   Pulse 61   Temp (!) 97.4 F (36.3 C) (Temporal)   Ht  6' 1 (1.854 m)   Wt 209 lb 9.6 oz (95.1 kg)   SpO2 94%   BMI 27.65 kg/m      Assessment & Plan:  Paul Murillo comes in today with chief complaint of Medical Management of Chronic Issues   Diagnosis and orders addressed:  1. CAD S/P percutaneous coronary angioplasty - metoprolol  succinate (TOPROL -XL) 25 MG 24 hr tablet; Take 1 tablet (25 mg total) by mouth daily.  Dispense: 90 tablet; Refill: 1 - CMP14+EGFR - CBC with Differential/Platelet - Lipid panel  2. Benign prostatic hyperplasia with nocturia - tamsulosin  (FLOMAX ) 0.4 MG CAPS capsule; Take 1 capsule (0.4 mg total) by mouth daily.  Dispense: 30 capsule; Refill: 0 - CMP14+EGFR - CBC with Differential/Platelet  3. Essential hypertension - telmisartan  (MICARDIS ) 80 MG tablet; Take 1 tablet (80 mg total) by mouth daily.  Dispense: 90 tablet; Refill: 2 - CMP14+EGFR - CBC with Differential/Platelet  4. Encounter for immunization - Flu vaccine HIGH DOSE PF(Fluzone Trivalent)  5. Annual physical exam (Primary) - CMP14+EGFR - CBC with Differential/Platelet - Lipid panel - PSA, total and free - TSH - VITAMIN D  25 Hydroxy (Vit-D Deficiency, Fractures)  6. Hypertensive heart disease with heart failure (HCC) - CMP14+EGFR - CBC with Differential/Platelet  7. Stage 3a chronic kidney disease (HCC) - CMP14+EGFR - CBC with Differential/Platelet  8. Prediabetes - CMP14+EGFR - CBC with Differential/Platelet - Bayer DCA Hb A1c  Waived  9. Coronary artery disease involving native coronary artery of native heart without angina pectoris - CMP14+EGFR - CBC with Differential/Platelet  10. Primary hypertension - CMP14+EGFR - CBC with Differential/Platelet  11. Mixed hyperlipidemia - CMP14+EGFR - CBC with Differential/Platelet - Lipid panel  12. Hypothyroidism, unspecified type - CMP14+EGFR - CBC with Differential/Platelet - TSH  13. Vitamin D  deficiency - VITAMIN D  25 Hydroxy (Vit-D Deficiency, Fractures)   Labs pending Continue current medications  Keep follow up with specialists  Health Maintenance reviewed Diet and exercise encouraged  Return in about 6 months (around 02/11/2025), or if symptoms worsen or fail to improve.    Bari Learn, FNP

## 2024-08-14 LAB — CMP14+EGFR
ALT: 33 IU/L (ref 0–44)
AST: 27 IU/L (ref 0–40)
Albumin: 4.4 g/dL (ref 3.9–4.9)
Alkaline Phosphatase: 92 IU/L (ref 47–123)
BUN/Creatinine Ratio: 12 (ref 10–24)
BUN: 15 mg/dL (ref 8–27)
Bilirubin Total: 0.4 mg/dL (ref 0.0–1.2)
CO2: 20 mmol/L (ref 20–29)
Calcium: 9.7 mg/dL (ref 8.6–10.2)
Chloride: 104 mmol/L (ref 96–106)
Creatinine, Ser: 1.28 mg/dL — AB (ref 0.76–1.27)
Globulin, Total: 2.7 g/dL (ref 1.5–4.5)
Glucose: 109 mg/dL — AB (ref 70–99)
Potassium: 4.8 mmol/L (ref 3.5–5.2)
Sodium: 140 mmol/L (ref 134–144)
Total Protein: 7.1 g/dL (ref 6.0–8.5)
eGFR: 60 mL/min/1.73 (ref >59)

## 2024-08-14 LAB — CBC WITH DIFFERENTIAL/PLATELET
Basophils Absolute: 0.1 x10E3/uL (ref 0.0–0.2)
Basos: 1 %
EOS (ABSOLUTE): 0.5 x10E3/uL — ABNORMAL HIGH (ref 0.0–0.4)
Eos: 7 %
Hematocrit: 43.5 % (ref 37.5–51.0)
Hemoglobin: 14.6 g/dL (ref 13.0–17.7)
Immature Grans (Abs): 0 x10E3/uL (ref 0.0–0.1)
Immature Granulocytes: 0 %
Lymphocytes Absolute: 2.5 x10E3/uL (ref 0.7–3.1)
Lymphs: 35 %
MCH: 31.3 pg (ref 26.6–33.0)
MCHC: 33.6 g/dL (ref 31.5–35.7)
MCV: 93 fL (ref 79–97)
Monocytes Absolute: 0.7 x10E3/uL (ref 0.1–0.9)
Monocytes: 10 %
Neutrophils Absolute: 3.3 x10E3/uL (ref 1.4–7.0)
Neutrophils: 47 %
Platelets: 251 x10E3/uL (ref 150–450)
RBC: 4.66 x10E6/uL (ref 4.14–5.80)
RDW: 13.5 % (ref 11.6–15.4)
WBC: 7 x10E3/uL (ref 3.4–10.8)

## 2024-08-14 LAB — LIPID PANEL
Cholesterol, Total: 255 mg/dL — AB (ref 100–199)
HDL: 32 mg/dL — AB (ref >39)
LDL CALC COMMENT:: 8 ratio — AB (ref 0.0–5.0)
LDL Chol Calc (NIH): 96 mg/dL (ref 0–99)
Triglycerides: 746 mg/dL — AB (ref 0–149)
VLDL Cholesterol Cal: 127 mg/dL — AB (ref 5–40)

## 2024-08-14 LAB — PSA, TOTAL AND FREE
PSA, Free Pct: 12 %
PSA, Free: 0.06 ng/mL
Prostate Specific Ag, Serum: 0.5 ng/mL (ref 0.0–4.0)

## 2024-08-14 LAB — VITAMIN D 25 HYDROXY (VIT D DEFICIENCY, FRACTURES): Vit D, 25-Hydroxy: 27 ng/mL — AB (ref 30.0–100.0)

## 2024-08-14 LAB — TSH: TSH: 2.37 u[IU]/mL (ref 0.450–4.500)

## 2024-08-15 ENCOUNTER — Ambulatory Visit: Payer: Self-pay | Admitting: Family

## 2024-08-15 ENCOUNTER — Ambulatory Visit: Payer: Self-pay

## 2024-08-15 DIAGNOSIS — E782 Mixed hyperlipidemia: Secondary | ICD-10-CM

## 2024-08-15 MED ORDER — VITAMIN D (ERGOCALCIFEROL) 1.25 MG (50000 UNIT) PO CAPS: 50000.0000 [IU] | ORAL_CAPSULE | ORAL | 3 refills | Status: AC

## 2024-08-15 NOTE — Telephone Encounter (Signed)
 FYI Only or Action Required?: Action required by provider: clinical question for provider.  Patient was last seen in primary care on 08/13/2024 by Lavell Bari LABOR, FNP.  Called Nurse Triage reporting Advice Only.  Symptoms began today.  Interventions attempted: Nothing.  Symptoms are: unchanged.  Triage Disposition: Call PCP When Office is Open  Patient/caregiver understands and will follow disposition?: Yes     Copied from CRM [id number]. Topic: Clinical - Lab/Test Results >> Aug 15, 2024  3:17 PM Emylou G wrote: Reason for CRM: Patient has concerns why his Vit D is low?  There is a new script sent in. Reason for Disposition  Caller requesting routine or non-urgent lab result  Answer Assessment - Initial Assessment Questions 1. REASON FOR CALL or QUESTION: What is your reason for calling today? or How can I best    Pt states that he had some questions. He asked if his triglycerides had to do with your vitamin D . RN advised they were two different things they were trying to explain to him. He states that he currently already takes the 50,000 units of vitamin D  so if it's that low why is it not being increased, or what is causing it to be low.  2. CALLER: Document the source of call. (e.g., laboratory staff, caregiver or patient).     Patient.  Protocols used: PCP Call - No Triage-A-AH

## 2024-08-15 NOTE — Telephone Encounter (Signed)
 Copied from CRM [id number]. Topic: Clinical - Lab/Test Results >> Aug 15, 2024  3:10 PM Emylou G wrote: Reason for CRM: Per the clinic results and msg.. I put in the lab request for Monday.. Pls order labs for it

## 2024-08-16 NOTE — Telephone Encounter (Signed)
 Continue oral Vit D, encourage sun exposure, and diet rich in vit D such as fatty fish and eggs

## 2024-08-16 NOTE — Telephone Encounter (Signed)
Pt notified.    LS

## 2024-08-19 ENCOUNTER — Other Ambulatory Visit

## 2024-08-19 DIAGNOSIS — E782 Mixed hyperlipidemia: Secondary | ICD-10-CM

## 2024-08-20 ENCOUNTER — Ambulatory Visit: Payer: Self-pay | Admitting: Family

## 2024-08-20 LAB — LIPID PANEL
Chol/HDL Ratio: 4.7 ratio (ref 0.0–5.0)
Cholesterol, Total: 198 mg/dL (ref 100–199)
HDL: 42 mg/dL (ref >39)
LDL Chol Calc (NIH): 133 mg/dL — ABNORMAL HIGH (ref 0–99)
Triglycerides: 128 mg/dL (ref 0–149)
VLDL Cholesterol Cal: 23 mg/dL (ref 5–40)

## 2024-08-26 ENCOUNTER — Encounter: Payer: Self-pay | Admitting: Radiology

## 2024-09-11 ENCOUNTER — Encounter: Payer: Self-pay | Admitting: Family Medicine

## 2024-09-11 ENCOUNTER — Ambulatory Visit (INDEPENDENT_AMBULATORY_CARE_PROVIDER_SITE_OTHER): Admitting: Family Medicine

## 2024-09-11 ENCOUNTER — Ambulatory Visit (INDEPENDENT_AMBULATORY_CARE_PROVIDER_SITE_OTHER)

## 2024-09-11 VITALS — BP 112/70 | HR 90 | Temp 98.2°F | Ht 73.0 in | Wt 205.4 lb

## 2024-09-11 DIAGNOSIS — R051 Acute cough: Secondary | ICD-10-CM

## 2024-09-11 DIAGNOSIS — Z87891 Personal history of nicotine dependence: Secondary | ICD-10-CM

## 2024-09-11 DIAGNOSIS — J441 Chronic obstructive pulmonary disease with (acute) exacerbation: Secondary | ICD-10-CM

## 2024-09-11 DIAGNOSIS — J439 Emphysema, unspecified: Secondary | ICD-10-CM | POA: Diagnosis not present

## 2024-09-11 DIAGNOSIS — R0982 Postnasal drip: Secondary | ICD-10-CM | POA: Diagnosis not present

## 2024-09-11 LAB — VERITOR SARS-COV-2 AND FLU A+B
BD Veritor SARS-CoV-2 Ag: NEGATIVE
Influenza A: NEGATIVE
Influenza B: NEGATIVE

## 2024-09-11 MED ORDER — BENZONATATE 200 MG PO CAPS: 200.0000 mg | ORAL_CAPSULE | Freq: Two times a day (BID) | ORAL | 0 refills | Status: AC | PRN

## 2024-09-11 MED ORDER — PREDNISONE 20 MG PO TABS: 40.0000 mg | ORAL_TABLET | Freq: Every day | ORAL | 0 refills | Status: AC

## 2024-09-11 MED ORDER — DOXYCYCLINE HYCLATE 100 MG PO TABS: 100.0000 mg | ORAL_TABLET | Freq: Two times a day (BID) | ORAL | 0 refills | Status: AC

## 2024-09-11 MED ORDER — IPRATROPIUM-ALBUTEROL 0.5-2.5 (3) MG/3ML IN SOLN
3.0000 mL | Freq: Once | RESPIRATORY_TRACT | Status: AC
  Administered 2024-09-11: 3 mL via RESPIRATORY_TRACT

## 2024-09-11 NOTE — Patient Instructions (Addendum)
 You should schedule a follow up visit with Dr Kara for your lungs and to make sure this cough has cleared up. Make sure to use the cetirizine  for allergy/ drainage control. Take doxycyline twice daily with a meal.   Take prednisone  with breakfast each morning.  Ok to take together.  Chronic Obstructive Pulmonary Disease Exacerbation  Chronic obstructive pulmonary disease (COPD) is a long-term (chronic) lung problem. When you have COPD, it can feel harder to breathe in or out. COPD exacerbation is a flare-up of symptoms when breathing gets worse and more treatment may be needed. Without treatment, flare-ups can be life-threatening. If they happen often, your lungs can become more damaged. What are the causes? Not taking your usual COPD medicines as told by your health care provider. A cold or the flu, which can cause infection in your lungs. Being exposed to things that make your breathing worse, such as: Smoke. Air pollution. Fumes. Dust. Allergies. Weather changes. What are the signs or symptoms? Symptoms do not get better or get worse even if you take your medicines as told by your provider. Symptoms may include: More shortness of breath. You may only be able to speak one or two words at a time. More coughing or mucus from your lungs. More wheezing or chest tightness. Being more tired and having less energy. Confusion. How is this diagnosed? This condition is diagnosed based on: Symptoms that get worse. Your medical history. A physical exam. You may also have tests, including: A chest X-ray. Blood or mucus tests. How is this treated? You may be able to stay home or you may need to go to the hospital. Treatment may include: Taking medicines. These may include: Inhalers. These have medicines in them that you breathe in. These may be more of what you already take or they may be new. Steroids. These reduce inflammation in the airways. These may be inhaled, taken by mouth, or  given in an IV. Antibiotics. These treat infection. Using oxygen. Using a device to help you clear mucus. Follow these instructions at home: Medicines Take your medicines only as told by your provider. If you were given antibiotics or steroids, take them as told by your provider. Do not stop taking them even if you start to feel better. Lifestyle Several times a day, wash your hands with soap and water for at least 20 seconds. If you cannot use soap and water, use hand sanitizer. This may help keep you from getting an infection. Avoid being around crowds or people who are sick. Do not smoke or use any products that contain nicotine or tobacco. If you need help quitting, ask your provider. Return to your normal activities when your provider says that it's safe. Use breathing methods to control your stress and catch your breath. How is this prevented? Follow your COPD action plan. The action plan tells you what to do if you're feeling good and what to do when you start feeling worse. Discuss the plan often with your provider. Make sure you get all the shots, also called vaccines, that your provider recommends. Ask your provider about a flu shot and a pneumonia shot. Use oxygen therapy if told by your provider. If you need home oxygen therapy, ask your provider how often to check your oxygen level with a device called an oximeter. Keep all follow-up visits to review your COPD action plan. Your provider will want to check on your condition often to keep you healthy and out of the hospital. Contact  a health care provider if: Your COPD symptoms get worse. You have a fever or chills. You have trouble doing daily activities. You have trouble breathing even when you are resting. Get help right away if: You are short of breath and cannot: Talk in full sentences. Do normal activities. You have chest pain. You feel confused. These symptoms may be an emergency. Call 911 right away. Do not wait to  see if the symptoms will go away. Do not drive yourself to the hospital. This information is not intended to replace advice given to you by your health care provider. Make sure you discuss any questions you have with your health care provider. Document Revised: 07/13/2023 Document Reviewed: 12/26/2022 Elsevier Patient Education  2024 [employer].

## 2024-09-11 NOTE — Progress Notes (Signed)
 Subjective: Paul Murillo PCP: Paul Murillo LABOR, FNP Paul Murillo is a 68 y.o. male presenting to clinic today for:  He reports about a 3-week history of increasingly worsening and productive cough.  He denies any fevers, chills, known sick contacts.  No shortness of breath, wheezing or chest pain.  Last prednisone  use was May 2025 by his pulmonologist, Dr.Dewald.  He was supposed to follow-up for 70-month follow-up this month but has not done so yet.  Due for lung cancer screening as well.  Former smoker.  He has mild centrilobular and paraseptal emphysema noted on CT lung cancer screening in November 2024.  Prescribed Breztri  by pulmonology.  He does report compliance with Breztri  and has used his albuterol  inhaler a few times but really has not noticed any significant difference in the cough or breathing.  He does report some postnasal drainage   ROS: Per HPI  No Known Allergies Past Medical History:  Diagnosis Date   Cancer (HCC)    Prostate   Coronary artery disease    Depression    ED (erectile dysfunction)    Genitourinary disorder    GERD (gastroesophageal reflux disease)    Headache    Hypercholesteremia    Hypertension    MI (myocardial infarction) (HCC)    Pneumonia    Reflux    Syncope    Vitamin D  deficiency     Current Outpatient Medications:    albuterol  (VENTOLIN  HFA) 108 (90 Base) MCG/ACT inhaler, INHALE 2 PUFFS INTO THE LUNGS EVERY 6 HOURS AS NEEDED (COUGH)., Disp: 8.5 each, Rfl: 2   alfuzosin  (UROXATRAL ) 10 MG 24 hr tablet, Take 1 tablet (10 mg total) by mouth daily with breakfast., Disp: 90 tablet, Rfl: 0   aspirin  81 MG EC tablet, Take 81 mg by mouth daily., Disp: , Rfl:    atorvastatin  (LIPITOR) 80 MG tablet, Take 1 tablet (80 mg total) by mouth daily., Disp: 90 tablet, Rfl: 1   benzonatate  (TESSALON ) 200 MG capsule, Take 1 capsule (200 mg total) by mouth 2 (two) times daily as needed for cough., Disp: 20 capsule, Rfl: 0   Budeson-Glycopyrrol-Formoterol  (BREZTRI  AEROSPHERE) 160-9-4.8 MCG/ACT AERO, Inhale 2 puffs into the lungs in the morning and at bedtime., Disp: 10.7 g, Rfl: 11   cetirizine  (ZYRTEC ) 10 MG tablet, TAKE 1 TABLET BY MOUTH EVERYDAY AT BEDTIME, Disp: 90 tablet, Rfl: 1   dapagliflozin propanediol (FARXIGA) 10 MG TABS tablet, Take by mouth daily., Disp: , Rfl:    doxycycline  (VIBRA -TABS) 100 MG tablet, Take 1 tablet (100 mg total) by mouth 2 (two) times daily for 7 days., Disp: 14 tablet, Rfl: 0   famotidine  (PEPCID ) 20 MG tablet, Take 1 tablet (20 mg total) by mouth at bedtime., Disp: 30 tablet, Rfl: 5   fluticasone  (FLONASE ) 50 MCG/ACT nasal spray, PLACE 1 SPRAY INTO BOTH NOSTRILS 2 (TWO) TIMES DAILY AS NEEDED FOR ALLERGIES OR RHINITIS., Disp: 48 mL, Rfl: 1   levothyroxine  (SYNTHROID ) 75 MCG tablet, TAKE 1 TABLET BY MOUTH EVERY DAY, Disp: 90 tablet, Rfl: 2   metoprolol  succinate (TOPROL -XL) 25 MG 24 hr tablet, Take 1 tablet (25 mg total) by mouth daily., Disp: 90 tablet, Rfl: 1   nitroGLYCERIN  (NITROSTAT ) 0.4 MG SL tablet, Place 1 tablet (0.4 mg total) under the tongue every 5 (five) minutes as needed for chest pain. Reported on 04/01/2016, Disp: 30 tablet, Rfl: 1   ondansetron  (ZOFRAN ) 4 MG tablet, Take 1 tablet (4 mg total) by mouth every 6 (six) hours as needed  for nausea or vomiting. Take every 6 hours during bowel preparation for nausea/vomiting, Disp: 15 tablet, Rfl: 0   oxybutynin (DITROPAN-XL) 10 MG 24 hr tablet, Take 1 tablet by mouth daily., Disp: , Rfl:    predniSONE  (DELTASONE ) 20 MG tablet, Take 2 tablets (40 mg total) by mouth daily with breakfast for 3 days., Disp: 6 tablet, Rfl: 0   telmisartan  (MICARDIS ) 80 MG tablet, Take 1 tablet (80 mg total) by mouth daily., Disp: 90 tablet, Rfl: 2   Vitamin D , Ergocalciferol , (DRISDOL ) 1.25 MG (50000 UNIT) CAPS capsule, Take 1 capsule (50,000 Units total) by mouth every 7 (seven) days., Disp: 12 capsule, Rfl: 3   pantoprazole  (PROTONIX ) 40 MG tablet, Take 1 tablet (40 mg total) by  mouth daily., Disp: 30 tablet, Rfl: 11   tamsulosin  (FLOMAX ) 0.4 MG CAPS capsule, Take 1 capsule (0.4 mg total) by mouth daily., Disp: 30 capsule, Rfl: 0   vardenafil  (LEVITRA ) 20 MG tablet, Take 1 tablet (20 mg total) by mouth daily as needed for erectile dysfunction., Disp: 10 tablet, Rfl: 2 Social History   Socioeconomic History   Marital status: Married    Spouse name: Paul Murillo   Number of children: 2   Years of education: Not on file   Highest education level: 12th grade  Occupational History   Occupation: Retired  Tobacco Use   Smoking status: Former    Current packs/day: 0.00    Average packs/day: 0.5 packs/day for 50.0 years (25.0 ttl pk-yrs)    Types: Cigarettes    Start date: 12/31/1971    Quit date: 12/30/2021    Years since quitting: 2.7   Smokeless tobacco: Never   Tobacco comments:    Quit 2021 - started back  Vaping Use   Vaping status: Never Used  Substance and Sexual Activity   Alcohol use: Yes    Alcohol/week: 0.0 standard drinks of alcohol    Comment: occ.   Drug use: No   Sexual activity: Yes  Other Topics Concern   Not on file  Social History Narrative   Children live in [place]   Social Drivers of Health   Financial Resource Strain: Low Risk  (06/14/2024)   Overall Financial Resource Strain (CARDIA)    Difficulty of Paying Living Expenses: Not hard at all  Food Insecurity: No Food Insecurity (06/14/2024)   Hunger Vital Sign    Worried About Running Out of Food in the Last Year: Never true    Ran Out of Food in the Last Year: Never true  Transportation Needs: No Transportation Needs (06/14/2024)   PRAPARE - Administrator, Civil Service (Medical): No    Lack of Transportation (Non-Medical): No  Physical Activity: Inactive (06/14/2024)   Exercise Vital Sign    Days of Exercise per Week: 0 days    Minutes of Exercise per Session: 0 min  Stress: No Stress Concern Present (06/14/2024)   Paul Murillo of Occupational Health - Occupational Stress  Questionnaire    Feeling of Stress: Not at all  Social Connections: Socially Integrated (06/14/2024)   Social Connection and Isolation Panel    Frequency of Communication with Friends and Family: More than three times a week    Frequency of Social Gatherings with Friends and Family: More than three times a week    Attends Religious Services: More than 4 times per year    Active Member of [employer] or Organizations: Yes    Attends Banker Meetings: More than 4 times per year  Marital Status: Married  Catering Manager Violence: Not At Risk (06/14/2024)   Humiliation, Afraid, Rape, and Kick questionnaire    Fear of Current or Ex-Partner: No    Emotionally Abused: No    Physically Abused: No    Sexually Abused: No   Family History  Problem Relation Age of Onset   Heart disease Father    Hypertension Father    Diabetes Brother    Liver disease Neg Hx    Esophageal cancer Neg Hx    Colon cancer Neg Hx    Colon polyps Neg Hx    Rectal cancer Neg Hx    Stomach cancer Neg Hx     Objective: Office vital signs reviewed. BP 112/70   Pulse 90   Temp 98.2 F (36.8 C)   Ht 6' 1 (1.854 m)   Wt 205 lb 6 oz (93.2 kg)   SpO2 93%   BMI 27.10 kg/m   Physical Examination:  General: Awake, alert, well nourished, No acute distress HEENT: Normal    Neck: No masses palpated. No lymphadenopathy    Ears: Tympanic membranes intact, normal light reflex, no erythema, no bulging    Eyes: PERRLA, extraocular membranes intact, sclera white    Nose: nasal turbinates moist, clear nasal discharge    Throat: moist mucus membranes, no erythema, no tonsillar exudate.  Airway is patent Cardio: regular rate and rhythm, S1S2 heard, no murmurs appreciated Pulm: Globally decreased breath sounds with normal work of breathing on room air.  Intermittent harsh coughing spells.  No wheezes, rhonchi or rails appreciated on initial exam.  DG Chest 2 View Result Date: 09/11/2024 CLINICAL DATA:  Cough.   Former smoker with COPD. EXAM: CHEST - 2 VIEW COMPARISON:  Chest x-ray 09/11/2023 FINDINGS: The cardiac silhouette, mediastinal and hilar contours are within normal limits and stable. Stable chronic bronchitic type lung changes and streaky areas of scarring. No infiltrates or effusions or pulmonary edema. LAD stent again noted. The bony thorax is intact. IMPRESSION: Chronic lung changes but no acute pulmonary findings. Electronically Signed   By: MYRTIS Stammer M.D.   On: 09/11/2024 14:52     Assessment/ Plan: 68 y.o. male   COPD exacerbation (HCC) - Plan: DG Chest 2 View, Veritor SARS-CoV-2 and Flu A+B, ipratropium-albuterol  (DUONEB) 0.5-2.5 (3) MG/3ML nebulizer solution 3 mL, predniSONE  (DELTASONE ) 20 MG tablet, doxycycline  (VIBRA -TABS) 100 MG tablet, benzonatate  (TESSALON ) 200 MG capsule  Pulmonary emphysema (HCC) - Plan: DG Chest 2 View, Veritor SARS-CoV-2 and Flu A+B  Former heavy tobacco smoker - Plan: DG Chest 2 View, Veritor SARS-CoV-2 and Flu A+B  Postnasal drip   Treating a COPD exacerbation given productive cough that has been worsening over the last 3 weeks.  Plain films obtained and a personal review of these films demonstrate no acute pulmonary infiltrates but more bronchitic changes.  This is consistent with pulmonary emphysema which she has a known diagnosis of.  Rapid flu/COVID was negative.  I am treating him empirically with oral antibiotics and prednisone  burst.  He was given a DuoNeb here in office and air movement seem to be better after that.  I encouraged him to follow-up with pulmonology for ongoing management.  Encouraged him to continue use of Breztri  to reduce flares.  I congratulated him on smoking cessation maintenance   Meher Kucinski CHRISTELLA Fielding, DO [facility] [phone]

## 2024-10-01 ENCOUNTER — Other Ambulatory Visit: Payer: Self-pay | Admitting: Family

## 2024-10-01 DIAGNOSIS — N401 Enlarged prostate with lower urinary tract symptoms: Secondary | ICD-10-CM

## 2024-10-03 ENCOUNTER — Encounter: Payer: Self-pay | Admitting: Family

## 2024-10-03 ENCOUNTER — Ambulatory Visit: Admitting: Family

## 2024-10-03 VITALS — BP 120/76 | HR 60 | Temp 98.0°F | Ht 73.0 in | Wt 209.6 lb

## 2024-10-03 DIAGNOSIS — E782 Mixed hyperlipidemia: Secondary | ICD-10-CM

## 2024-10-03 DIAGNOSIS — I110 Hypertensive heart disease with heart failure: Secondary | ICD-10-CM

## 2024-10-03 DIAGNOSIS — N1831 Chronic kidney disease, stage 3a: Secondary | ICD-10-CM

## 2024-10-03 DIAGNOSIS — J439 Emphysema, unspecified: Secondary | ICD-10-CM

## 2024-10-03 DIAGNOSIS — F321 Major depressive disorder, single episode, moderate: Secondary | ICD-10-CM | POA: Diagnosis not present

## 2024-10-03 DIAGNOSIS — I2510 Atherosclerotic heart disease of native coronary artery without angina pectoris: Secondary | ICD-10-CM

## 2024-10-03 DIAGNOSIS — R7303 Prediabetes: Secondary | ICD-10-CM | POA: Diagnosis not present

## 2024-10-03 DIAGNOSIS — N529 Male erectile dysfunction, unspecified: Secondary | ICD-10-CM | POA: Diagnosis not present

## 2024-10-03 DIAGNOSIS — N401 Enlarged prostate with lower urinary tract symptoms: Secondary | ICD-10-CM | POA: Diagnosis not present

## 2024-10-03 DIAGNOSIS — I700 Atherosclerosis of aorta: Secondary | ICD-10-CM | POA: Diagnosis not present

## 2024-10-03 DIAGNOSIS — E039 Hypothyroidism, unspecified: Secondary | ICD-10-CM | POA: Diagnosis not present

## 2024-10-03 DIAGNOSIS — E559 Vitamin D deficiency, unspecified: Secondary | ICD-10-CM

## 2024-10-03 MED ORDER — FLUTICASONE PROPIONATE 50 MCG/ACT NA SUSP: 1.0000 | Freq: Two times a day (BID) | NASAL | 1 refills | Status: AC | PRN

## 2024-10-03 MED ORDER — ESCITALOPRAM OXALATE 5 MG PO TABS: 5.0000 mg | ORAL_TABLET | Freq: Every day | ORAL | 1 refills | Status: DC

## 2024-10-03 MED ORDER — TAMSULOSIN HCL 0.4 MG PO CAPS: 0.4000 mg | ORAL_CAPSULE | Freq: Every day | ORAL | 0 refills | Status: AC

## 2024-10-03 MED ORDER — VARDENAFIL HCL 20 MG PO TABS: 20.0000 mg | ORAL_TABLET | Freq: Every day | ORAL | 2 refills | Status: AC | PRN

## 2024-10-03 NOTE — Patient Instructions (Signed)

## 2024-10-03 NOTE — Progress Notes (Signed)
 Subjective:    Patient ID: Paul Murillo, male    DOB: 1956-01-31, 68 y.o.   MRN: [id number]  Chief Complaint  Patient presents with   COPD   Depression   Pt presents to the office today for  chronic follow up .   Pt is followed by Urologists annually for Prostate Cancer.  PT states he is followed by Cardiologists annually for CAD.   He has CKD and limits NSAID's. Stable.  Hypertension This is a chronic problem. The current episode started more than 1 year ago. The problem has been resolved since onset. The problem is controlled. Associated symptoms include malaise/fatigue. Pertinent negatives include no blurred vision, peripheral edema or shortness of breath. Risk factors for coronary artery disease include dyslipidemia, diabetes mellitus, male gender and sedentary lifestyle. The current treatment provides moderate improvement. Identifiable causes of hypertension include a thyroid  problem.  Thyroid  Problem Presents for follow-up visit. Symptoms include fatigue. Patient reports no anxiety, constipation or dry skin. The symptoms have been stable.  Benign Prostatic Hypertrophy This is a chronic problem. The current episode started more than 1 year ago. Irritative symptoms include nocturia (2-3). Past treatments include tamsulosin  and finasteride. The treatment provided mild relief.  Hyperlipidemia This is a chronic problem. The current episode started more than 1 year ago. The problem is uncontrolled. Recent lipid tests were reviewed and are high. Pertinent negatives include no shortness of breath. Current antihyperlipidemic treatment includes statins. The current treatment provides moderate improvement of lipids. Risk factors for coronary artery disease include dyslipidemia, hypertension, a sedentary lifestyle and male sex.  Diabetes Diabetes type: prediabetes. Pertinent negatives for hypoglycemia include no nervousness/anxiousness. Associated symptoms include fatigue. Pertinent negatives  for diabetes include no blurred vision and no foot paresthesias. Risk factors for coronary artery disease include male sex and hypertension. (Does not check glucose at home)  COPD He complains of wheezing. There is no cough or shortness of breath. This is a chronic problem. The current episode started more than 1 year ago. The problem occurs intermittently. Associated symptoms include malaise/fatigue. His symptoms are alleviated by rest. He reports minimal improvement on treatment. His symptoms are not alleviated by rest. His past medical history is significant for COPD.  Depression        This is a new problem.  The current episode started more than 1 year ago.   The problem occurs intermittently.  Associated symptoms include fatigue, helplessness, irritable, restlessness, decreased interest and sad.  Associated symptoms include no hopelessness and no suicidal ideas.  Past treatments include nothing.  Past medical history includes thyroid  problem.       Review of Systems  Constitutional:  Positive for fatigue and malaise/fatigue.  Eyes:  Negative for blurred vision.  Respiratory:  Positive for wheezing. Negative for cough and shortness of breath.   Gastrointestinal:  Negative for constipation.  Genitourinary:  Positive for nocturia (2-3).  Psychiatric/Behavioral:  Positive for depression. Negative for suicidal ideas. The patient is not nervous/anxious.   All other systems reviewed and are negative.   Social History   Socioeconomic History   Marital status: Married    Spouse name: Paul Murillo   Number of children: 2   Years of education: Not on file   Highest education level: 12th grade  Occupational History   Occupation: Retired  Tobacco Use   Smoking status: Former    Current packs/day: 0.00    Average packs/day: 0.5 packs/day for 50.0 years (25.0 ttl pk-yrs)    Types: Cigarettes  Start date: 12/31/1971    Quit date: 12/30/2021    Years since quitting: 2.7   Smokeless tobacco: Never    Tobacco comments:    Quit 2021 - started back  Vaping Use   Vaping status: Never Used  Substance and Sexual Activity   Alcohol use: Yes    Alcohol/week: 0.0 standard drinks of alcohol    Comment: occ.   Drug use: No   Sexual activity: Yes  Other Topics Concern   Not on file  Social History Narrative   Children live in [place]   Social Drivers of Health   Tobacco Use: Medium Risk (10/03/2024)   Patient History    Smoking Tobacco Use: Former    Smokeless Tobacco Use: Never    Passive Exposure: Not on file  Financial Resource Strain: Low Risk (06/14/2024)   Overall Financial Resource Strain (CARDIA)    Difficulty of Paying Living Expenses: Not hard at all  Food Insecurity: No Food Insecurity (06/14/2024)   Epic    Worried About Radiation Protection Practitioner of Food in the Last Year: Never true    Ran Out of Food in the Last Year: Never true  Transportation Needs: No Transportation Needs (06/14/2024)   Epic    Lack of Transportation (Medical): No    Lack of Transportation (Non-Medical): No  Physical Activity: Inactive (06/14/2024)   Exercise Vital Sign    Days of Exercise per Week: 0 days    Minutes of Exercise per Session: 0 min  Stress: No Stress Concern Present (06/14/2024)   [employer] of Occupational Health - Occupational Stress Questionnaire    Feeling of Stress: Not at all  Social Connections: Socially Integrated (06/14/2024)   Social Connection and Isolation Panel    Frequency of Communication with Friends and Family: More than three times a week    Frequency of Social Gatherings with Friends and Family: More than three times a week    Attends Religious Services: More than 4 times per year    Active Member of Clubs or Organizations: Yes    Attends Banker Meetings: More than 4 times per year    Marital Status: Married  Depression (PHQ2-9): High Risk (09/11/2024)   Depression (PHQ2-9)    PHQ-2 Score: 12  Alcohol Screen: Low Risk (06/14/2024)   Alcohol Screen    Last  Alcohol Screening Score (AUDIT): 0  Housing: Unknown (06/14/2024)   Epic    Unable to Pay for Housing in the Last Year: No    Number of Times Moved in the Last Year: Not on file    Homeless in the Last Year: No  Utilities: Not At Risk (06/14/2024)   Epic    Threatened with loss of utilities: No  Health Literacy: Adequate Health Literacy (06/14/2024)   B1300 Health Literacy    Frequency of need for help with medical instructions: Never   Family History  Problem Relation Age of Onset   Heart disease Father    Hypertension Father    Diabetes Brother    Liver disease Neg Hx    Esophageal cancer Neg Hx    Colon cancer Neg Hx    Colon polyps Neg Hx    Rectal cancer Neg Hx    Stomach cancer Neg Hx         Objective:   Physical Exam Vitals reviewed.  Constitutional:      General: He is irritable. He is not in acute distress.    Appearance: He is well-developed.  HENT:  Head: Normocephalic.     Right Ear: Tympanic membrane normal.     Left Ear: Tympanic membrane normal.  Eyes:     General:        Right eye: No discharge.        Left eye: No discharge.     Pupils: Pupils are equal, round, and reactive to light.  Neck:     Thyroid : No thyromegaly.  Cardiovascular:     Rate and Rhythm: Normal rate and regular rhythm.     Heart sounds: Normal heart sounds. No murmur heard. Pulmonary:     Effort: Pulmonary effort is normal. No respiratory distress.     Breath sounds: Normal breath sounds. No wheezing.  Abdominal:     General: Bowel sounds are normal. There is no distension.     Palpations: Abdomen is soft.     Tenderness: There is no abdominal tenderness.  Musculoskeletal:        General: No tenderness. Normal range of motion.     Cervical back: Normal range of motion and neck supple.  Skin:    General: Skin is warm and dry.     Findings: No erythema or rash.  Neurological:     Mental Status: He is alert and oriented to person, place, and time.     Cranial Nerves: No  cranial nerve deficit.     Deep Tendon Reflexes: Reflexes are normal and symmetric.  Psychiatric:        Behavior: Behavior normal.        Thought Content: Thought content normal.        Judgment: Judgment normal.       BP 120/76   Pulse 60   Temp 98 F (36.7 C) (Temporal)   Ht 6' 1 (1.854 m)   Wt 209 lb 9.6 oz (95.1 kg)   SpO2 90%   BMI 27.65 kg/m      Assessment & Plan:  Paul Murillo comes in today with chief complaint of COPD and Depression   Diagnosis and orders addressed:  1. CAD S/P percutaneous coronary angioplasty (Primary) Keep Cardiologists follow up  2. Coronary artery disease involving native coronary artery of native heart without angina pectoris  3. Hypertensive heart disease with heart failure (HCC) At goal today   4. Aortic atherosclerosis Continue Lipitor  5. Pulmonary emphysema (HCC) - fluticasone  (FLONASE ) 50 MCG/ACT nasal spray; Place 1 spray into both nostrils 2 (two) times daily as needed for allergies or rhinitis.  Dispense: 48 mL; Refill: 1  6. Hypothyroidism, unspecified type Continue levothyroxine    7. Stage 3a chronic kidney disease (HCC) Avoid NSAID's   8. Benign prostatic hyperplasia with nocturia - tamsulosin  (FLOMAX ) 0.4 MG CAPS capsule; Take 1 capsule (0.4 mg total) by mouth daily.  Dispense: 90 capsule; Refill: 0  9. Vitamin D  deficiency  10. Prediabetes Low carb diet   11. Mixed hyperlipidemia Low fat diet   12. Depression, major, single episode, moderate (HCC) Start Lexapro 5 mg  Stress management  - escitalopram (LEXAPRO) 5 MG tablet; Take 1 tablet (5 mg total) by mouth daily.  Dispense: 90 tablet; Refill: 1  13. Erectile dysfunction, unspecified erectile dysfunction type - vardenafil  (LEVITRA ) 20 MG tablet; Take 1 tablet (20 mg total) by mouth daily as needed for erectile dysfunction.  Dispense: 10 tablet; Refill: 2   Labs pending Will start Lexaprox 5 mg Stress management Encouraged him to volunteer or get  part time job to get him out of the house.  Continue current  medications  Keep follow up with specialists  Health Maintenance reviewed Diet and exercise encouraged  Return in about 4 weeks (around 10/31/2024), or if symptoms worsen or fail to improve.    Bari Learn, FNP

## 2024-10-11 ENCOUNTER — Other Ambulatory Visit: Payer: Self-pay | Admitting: Family

## 2024-10-11 DIAGNOSIS — R053 Chronic cough: Secondary | ICD-10-CM

## 2024-10-11 DIAGNOSIS — R0982 Postnasal drip: Secondary | ICD-10-CM

## 2024-10-22 ENCOUNTER — Other Ambulatory Visit: Payer: Self-pay

## 2024-10-22 DIAGNOSIS — J432 Centrilobular emphysema: Secondary | ICD-10-CM

## 2024-10-22 MED ORDER — BREZTRI AEROSPHERE 160-9-4.8 MCG/ACT IN AERO: 2.0000 | INHALATION_SPRAY | Freq: Two times a day (BID) | RESPIRATORY_TRACT | 11 refills | Status: AC

## 2024-11-05 ENCOUNTER — Ambulatory Visit: Admitting: Family

## 2024-11-05 ENCOUNTER — Encounter: Payer: Self-pay | Admitting: Family

## 2024-11-05 VITALS — BP 104/69 | HR 80 | Temp 98.0°F | Ht 73.0 in | Wt 200.0 lb

## 2024-11-05 DIAGNOSIS — I700 Atherosclerosis of aorta: Secondary | ICD-10-CM

## 2024-11-05 DIAGNOSIS — I2510 Atherosclerotic heart disease of native coronary artery without angina pectoris: Secondary | ICD-10-CM | POA: Diagnosis not present

## 2024-11-05 DIAGNOSIS — E782 Mixed hyperlipidemia: Secondary | ICD-10-CM | POA: Diagnosis not present

## 2024-11-05 DIAGNOSIS — Z122 Encounter for screening for malignant neoplasm of respiratory organs: Secondary | ICD-10-CM

## 2024-11-05 DIAGNOSIS — J439 Emphysema, unspecified: Secondary | ICD-10-CM | POA: Diagnosis not present

## 2024-11-05 DIAGNOSIS — R55 Syncope and collapse: Secondary | ICD-10-CM | POA: Diagnosis not present

## 2024-11-05 DIAGNOSIS — E039 Hypothyroidism, unspecified: Secondary | ICD-10-CM

## 2024-11-05 DIAGNOSIS — I10 Essential (primary) hypertension: Secondary | ICD-10-CM

## 2024-11-05 DIAGNOSIS — N1831 Chronic kidney disease, stage 3a: Secondary | ICD-10-CM

## 2024-11-05 DIAGNOSIS — F321 Major depressive disorder, single episode, moderate: Secondary | ICD-10-CM

## 2024-11-05 DIAGNOSIS — Z9861 Coronary angioplasty status: Secondary | ICD-10-CM | POA: Diagnosis not present

## 2024-11-05 MED ORDER — ESCITALOPRAM OXALATE 10 MG PO TABS: 10.0000 mg | ORAL_TABLET | Freq: Every day | ORAL | 5 refills | Status: AC

## 2024-11-05 NOTE — Patient Instructions (Signed)
Syncope, Adult  Syncope refers to a condition in which a person temporarily loses consciousness. Syncope may also be called fainting or passing out. It is caused by a sudden decrease in blood flow to the brain. This can happen for a variety of reasons. Most causes of syncope are not dangerous. It can be triggered by things such as needle sticks, seeing blood, pain, or intense emotion. However, syncope can also be a sign of a serious medical problem, such as a heart abnormality. Other causes can include dehydration, migraines, or taking medicines that lower blood pressure. Your health care provider may do tests to find the reason why you are having syncope. If you faint, get medical help right away. Call your local emergency services (911 in the [place] Follow these instructions at home: Pay attention to any changes in your symptoms. Take these actions to stay safe and to help relieve your symptoms: Knowing when you may be about to faint Signs that you may be about to faint include: Feeling dizzy, weak, light-headed, or like the room is spinning. Feeling nauseous. Seeing spots or seeing all white or all black in your field of vision. Having cold, clammy skin or feeling warm and sweaty. Hearing ringing in the ears (tinnitus). If you start to feel like you might faint, sit or lie down right away. If sitting, put your head down between your legs. If lying down, raise (elevate) your feet above the level of your heart. Breathe deeply and steadily. Wait until all the symptoms have passed. Have someone stay with you until you feel stable. Medicines Take over-the-counter and prescription medicines only as told by your health care provider. If you are taking blood pressure or heart medicine, get up slowly and take several minutes to sit and then stand. This can reduce dizziness and decrease the risk of syncope. Lifestyle Do not drive, use machinery, or play sports until your health care provider says it is  okay. Do not drink alcohol. Do not use any products that contain nicotine or tobacco. These products include cigarettes, chewing tobacco, and vaping devices, such as e-cigarettes. If you need help quitting, ask your health care provider. Avoid hot tubs and saunas. General instructions Talk with your health care provider about your symptoms. You may need to have testing to understand the cause of your syncope. Drink enough fluid to keep your urine pale yellow. Avoid prolonged standing. If you must stand for a long time, do movements such as: Moving your legs. Crossing your legs. Flexing and stretching your leg muscles. Squatting. Keep all follow-up visits. This is important. Contact a health care provider if: You have episodes of near fainting. Get help right away if: You faint. You hit your head or are injured after fainting. You have any of these symptoms that may indicate trouble with your heart: Fast or irregular heartbeats (palpitations). Unusual pain in your chest, abdomen, or back. Shortness of breath. You have a seizure. You have a severe headache. You are confused. You have vision problems. You have severe weakness or trouble walking. You are bleeding from your mouth or rectum, or you have black or tarry stool. These symptoms may represent a serious problem that is an emergency. Do not wait to see if your symptoms will go away. Get medical help right away. Call your local emergency services (911 in the [place] Do not drive yourself to the hospital. Summary Syncope refers to a condition in which a person temporarily loses consciousness. Syncope may also be called   fainting or passing out. It is caused by a sudden decrease in blood flow to the brain. Signs that you may be about to faint include dizziness, feeling light-headed, feeling nauseous, sudden vision changes, or cold, clammy skin. Even though most causes of syncope are not dangerous, syncope can be a sign of a serious  medical problem. Get help right away if you faint. If you start to feel like you might faint, sit or lie down right away. If sitting, put your head down between your legs. If lying down, raise (elevate) your feet above the level of your heart. This information is not intended to replace advice given to you by your health care provider. Make sure you discuss any questions you have with your health care provider. Document Revised: 02/18/2021 Document Reviewed: 02/18/2021 Elsevier Patient Education  2024 [employer]  

## 2024-11-05 NOTE — Progress Notes (Signed)
 "  Subjective:    Patient ID: Paul Murillo, male    DOB: 04-23-1956, 69 y.o.   MRN: [id number]  Chief Complaint  Patient presents with   Follow-up    Went to cardiologist they told him his heart functions was not quit right not any better but not worse.   Pt presents to the office today for follow up on depression. .   Pt is followed by Urologists annually for Prostate Cancer.  PT states he is followed by Cardiologists annually for CAD.   He has CKD and limits NSAID's. Stable.  Hypertension This is a chronic problem. The current episode started more than 1 year ago. The problem has been resolved since onset. The problem is controlled. Associated symptoms include malaise/fatigue and shortness of breath. Pertinent negatives include no blurred vision or peripheral edema. Risk factors for coronary artery disease include dyslipidemia, diabetes mellitus, male gender and sedentary lifestyle. The current treatment provides moderate improvement. Identifiable causes of hypertension include a thyroid  problem.  Thyroid  Problem Presents for follow-up visit. Symptoms include fatigue. Patient reports no anxiety, constipation or dry skin. The symptoms have been stable.  Benign Prostatic Hypertrophy This is a chronic problem. The current episode started more than 1 year ago. Irritative symptoms include nocturia (2-3). Past treatments include tamsulosin  and finasteride. The treatment provided mild relief.  Hyperlipidemia This is a chronic problem. The current episode started more than 1 year ago. The problem is uncontrolled. Recent lipid tests were reviewed and are high. Associated symptoms include shortness of breath. Current antihyperlipidemic treatment includes statins. The current treatment provides moderate improvement of lipids. Risk factors for coronary artery disease include dyslipidemia, hypertension, a sedentary lifestyle and male sex.  Diabetes Diabetes type: prediabetes. Pertinent negatives for  hypoglycemia include no nervousness/anxiousness. Associated symptoms include fatigue. Pertinent negatives for diabetes include no blurred vision and no foot paresthesias. Risk factors for coronary artery disease include male sex and hypertension. (Does not check glucose at home)  COPD He complains of shortness of breath and wheezing. There is no cough. This is a chronic problem. The current episode started more than 1 year ago. The problem occurs intermittently. Associated symptoms include malaise/fatigue. His symptoms are alleviated by rest. He reports minimal improvement on treatment. His symptoms are not alleviated by rest. His past medical history is significant for COPD.  Depression        This is a recurrent problem.  The current episode started more than 1 year ago.   The problem occurs intermittently.  Associated symptoms include fatigue, decreased interest and sad.  Associated symptoms include no helplessness, no hopelessness, no restlessness and no suicidal ideas.  Past treatments include SSRIs - Selective serotonin reuptake inhibitors.  Past medical history includes thyroid  problem.   Loss of Consciousness This is a new problem. The current episode started 1 to 4 weeks ago. Episode frequency: twice. The problem has been unchanged. He lost consciousness for a period of less than 1 minute. Associated symptoms include light-headedness and malaise/fatigue. The treatment provided mild relief.      Review of Systems  Constitutional:  Positive for fatigue and malaise/fatigue.  Eyes:  Negative for blurred vision.  Respiratory:  Positive for shortness of breath and wheezing. Negative for cough.   Cardiovascular:  Positive for syncope.  Gastrointestinal:  Negative for constipation.  Genitourinary:  Positive for nocturia (2-3).  Neurological:  Positive for light-headedness.  Psychiatric/Behavioral:  Negative for suicidal ideas. The patient is not nervous/anxious.   All other systems reviewed and  are negative.   Social History   Socioeconomic History   Marital status: Married    Spouse name: Romero   Number of children: 2   Years of education: Not on file   Highest education level: 12th grade  Occupational History   Occupation: Retired  Tobacco Use   Smoking status: Former    Current packs/day: 0.00    Average packs/day: 0.5 packs/day for 50.0 years (25.0 ttl pk-yrs)    Types: Cigarettes    Start date: 12/31/1971    Quit date: 12/30/2021    Years since quitting: 2.8   Smokeless tobacco: Never   Tobacco comments:    Quit 2021 - started back  Vaping Use   Vaping status: Never Used  Substance and Sexual Activity   Alcohol use: Yes    Alcohol/week: 0.0 standard drinks of alcohol    Comment: occ.   Drug use: No   Sexual activity: Yes  Other Topics Concern   Not on file  Social History Narrative   Children live in [place]   Social Drivers of Health   Tobacco Use: Medium Risk (11/05/2024)   Patient History    Smoking Tobacco Use: Former    Smokeless Tobacco Use: Never    Passive Exposure: Not on file  Financial Resource Strain: Low Risk (06/14/2024)   Overall Financial Resource Strain (CARDIA)    Difficulty of Paying Living Expenses: Not hard at all  Food Insecurity: No Food Insecurity (06/14/2024)   Epic    Worried About Radiation Protection Practitioner of Food in the Last Year: Never true    Ran Out of Food in the Last Year: Never true  Transportation Needs: No Transportation Needs (06/14/2024)   Epic    Lack of Transportation (Medical): No    Lack of Transportation (Non-Medical): No  Physical Activity: Inactive (06/14/2024)   Exercise Vital Sign    Days of Exercise per Week: 0 days    Minutes of Exercise per Session: 0 min  Stress: No Stress Concern Present (06/14/2024)   [employer] of Occupational Health - Occupational Stress Questionnaire    Feeling of Stress: Not at all  Social Connections: Socially Integrated (06/14/2024)   Social Connection and Isolation Panel    Frequency  of Communication with Friends and Family: More than three times a week    Frequency of Social Gatherings with Friends and Family: More than three times a week    Attends Religious Services: More than 4 times per year    Active Member of Clubs or Organizations: Yes    Attends Banker Meetings: More than 4 times per year    Marital Status: Married  Depression (PHQ2-9): Low Risk (11/05/2024)   Depression (PHQ2-9)    PHQ-2 Score: 2  Recent Concern: Depression (PHQ2-9) - Medium Risk (10/03/2024)   Depression (PHQ2-9)    PHQ-2 Score: 8  Alcohol Screen: Low Risk (06/14/2024)   Alcohol Screen    Last Alcohol Screening Score (AUDIT): 0  Housing: Unknown (06/14/2024)   Epic    Unable to Pay for Housing in the Last Year: No    Number of Times Moved in the Last Year: Not on file    Homeless in the Last Year: No  Utilities: Not At Risk (06/14/2024)   Epic    Threatened with loss of utilities: No  Health Literacy: Adequate Health Literacy (06/14/2024)   B1300 Health Literacy    Frequency of need for help with medical instructions: Never   Family History  Problem Relation  Age of Onset   Heart disease Father    Hypertension Father    Diabetes Brother    Liver disease Neg Hx    Esophageal cancer Neg Hx    Colon cancer Neg Hx    Colon polyps Neg Hx    Rectal cancer Neg Hx    Stomach cancer Neg Hx         Objective:   Physical Exam Vitals reviewed.  Constitutional:      General: He is not in acute distress.    Appearance: He is well-developed.  HENT:     Head: Normocephalic.     Right Ear: Tympanic membrane normal.     Left Ear: Tympanic membrane normal.  Eyes:     General:        Right eye: No discharge.        Left eye: No discharge.     Pupils: Pupils are equal, round, and reactive to light.  Neck:     Thyroid : No thyromegaly.  Cardiovascular:     Rate and Rhythm: Normal rate and regular rhythm.     Heart sounds: Normal heart sounds. No murmur heard. Pulmonary:      Effort: Pulmonary effort is normal. No respiratory distress.     Breath sounds: Normal breath sounds. No wheezing.  Abdominal:     General: Bowel sounds are normal. There is no distension.     Palpations: Abdomen is soft.     Tenderness: There is no abdominal tenderness.  Musculoskeletal:        General: No tenderness. Normal range of motion.     Cervical back: Normal range of motion and neck supple.  Skin:    General: Skin is warm and dry.     Findings: No erythema or rash.  Neurological:     Mental Status: He is alert and oriented to person, place, and time.     Cranial Nerves: No cranial nerve deficit.     Deep Tendon Reflexes: Reflexes are normal and symmetric.  Psychiatric:        Behavior: Behavior normal.        Thought Content: Thought content normal.        Judgment: Judgment normal.       BP 104/69   Pulse 80   Temp 98 F (36.7 C) (Temporal)   Ht 6' 1 (1.854 m)   Wt 200 lb (90.7 kg)   SpO2 95%   BMI 26.39 kg/m      Assessment & Plan:  Cameran Pettey comes in today with chief complaint of Follow-up (Went to cardiologist they told him his heart functions was not quit right not any better but not worse.)   Diagnosis and orders addressed:  1. SYNCOPE (Primary) - Ambulatory referral to Cardiology - CMP14+EGFR - Anemia Profile B  2. Aortic atherosclerosis - CMP14+EGFR  3. CAD S/P percutaneous coronary angioplasty - CMP14+EGFR  4. Coronary artery disease involving native coronary artery of native heart without angina pectoris  - CMP14+EGFR  5. Depression, major, single episode, moderate (HCC) - escitalopram  (LEXAPRO ) 10 MG tablet; Take 1 tablet (10 mg total) by mouth daily.  Dispense: 30 tablet; Refill: 5 - CMP14+EGFR  6. Primary hypertension  - CMP14+EGFR  7. Mixed hyperlipidemia  - CMP14+EGFR  8. Hypothyroidism, unspecified type - CMP14+EGFR  9. Pulmonary emphysema (HCC)  - Ambulatory Referral for Lung Cancer Screening [REF832] -  CMP14+EGFR  10. Stage 3a chronic kidney disease (HCC) - CMP14+EGFR  11. Screening for lung cancer -  Ambulatory Referral for Lung Cancer Screening [REF832] - CMP14+EGFR   Labs pending Will increase Lexaprox to 10 mg from 5 mg Stress management Keep cardiologists follow up, discuss syncope episodes with them. Discussed ECHO results of 45% Continue current medications  Keep follow up with specialists  Health Maintenance reviewed Diet and exercise encouraged  Return in about 2 months (around 01/03/2025), or if symptoms worsen or fail to improve.    Bari Learn, FNP    "

## 2024-11-06 LAB — ANEMIA PROFILE B
Basophils Absolute: 0 x10E3/uL (ref 0.0–0.2)
Basos: 0 %
EOS (ABSOLUTE): 0.3 x10E3/uL (ref 0.0–0.4)
Eos: 6 %
Ferritin: 409 ng/mL — AB (ref 30–400)
Folate: 15.2 ng/mL
Hematocrit: 42.9 % (ref 37.5–51.0)
Hemoglobin: 14.1 g/dL (ref 13.0–17.7)
Immature Grans (Abs): 0 x10E3/uL (ref 0.0–0.1)
Immature Granulocytes: 0 %
Iron Saturation: 30 % (ref 15–55)
Iron: 71 ug/dL (ref 38–169)
Lymphocytes Absolute: 1.2 x10E3/uL (ref 0.7–3.1)
Lymphs: 26 %
MCH: 30.7 pg (ref 26.6–33.0)
MCHC: 32.9 g/dL (ref 31.5–35.7)
MCV: 94 fL (ref 79–97)
Monocytes Absolute: 0.4 x10E3/uL (ref 0.1–0.9)
Monocytes: 8 %
Neutrophils Absolute: 2.8 x10E3/uL (ref 1.4–7.0)
Neutrophils: 60 %
Platelets: 212 x10E3/uL (ref 150–450)
RBC: 4.59 x10E6/uL (ref 4.14–5.80)
RDW: 13.5 % (ref 11.6–15.4)
Retic Ct Pct: 0.7 % (ref 0.6–2.6)
Total Iron Binding Capacity: 238 ug/dL — AB (ref 250–450)
UIBC: 167 ug/dL (ref 111–343)
Vitamin B-12: 837 pg/mL (ref 232–1245)
WBC: 4.7 x10E3/uL (ref 3.4–10.8)

## 2024-11-06 LAB — CMP14+EGFR
ALT: 18 IU/L (ref 0–44)
AST: 24 IU/L (ref 0–40)
Albumin: 4.3 g/dL (ref 3.9–4.9)
Alkaline Phosphatase: 88 IU/L (ref 47–123)
BUN/Creatinine Ratio: 10 (ref 10–24)
BUN: 14 mg/dL (ref 8–27)
Bilirubin Total: 0.9 mg/dL (ref 0.0–1.2)
CO2: 21 mmol/L (ref 20–29)
Calcium: 9 mg/dL (ref 8.6–10.2)
Chloride: 102 mmol/L (ref 96–106)
Creatinine, Ser: 1.41 mg/dL — AB (ref 0.76–1.27)
Globulin, Total: 3.1 g/dL (ref 1.5–4.5)
Glucose: 167 mg/dL — AB (ref 70–99)
Potassium: 4 mmol/L (ref 3.5–5.2)
Sodium: 139 mmol/L (ref 134–144)
Total Protein: 7.4 g/dL (ref 6.0–8.5)
eGFR: 54 mL/min/1.73 — AB

## 2024-11-07 ENCOUNTER — Ambulatory Visit: Payer: Self-pay | Admitting: Family

## 2024-11-07 DIAGNOSIS — R7989 Other specified abnormal findings of blood chemistry: Secondary | ICD-10-CM

## 2024-11-15 ENCOUNTER — Other Ambulatory Visit

## 2024-11-15 DIAGNOSIS — R7989 Other specified abnormal findings of blood chemistry: Secondary | ICD-10-CM

## 2024-11-16 LAB — BMP8+EGFR
BUN/Creatinine Ratio: 7 — ABNORMAL LOW (ref 10–24)
BUN: 9 mg/dL (ref 8–27)
CO2: 21 mmol/L (ref 20–29)
Calcium: 9.6 mg/dL (ref 8.6–10.2)
Chloride: 105 mmol/L (ref 96–106)
Creatinine, Ser: 1.37 mg/dL — ABNORMAL HIGH (ref 0.76–1.27)
Glucose: 96 mg/dL (ref 70–99)
Potassium: 4.8 mmol/L (ref 3.5–5.2)
Sodium: 141 mmol/L (ref 134–144)
eGFR: 55 mL/min/{1.73_m2} — ABNORMAL LOW

## 2024-11-20 ENCOUNTER — Encounter: Payer: Self-pay | Admitting: Emergency Medicine

## 2024-11-21 ENCOUNTER — Ambulatory Visit: Payer: Self-pay | Admitting: Family

## 2024-11-21 DIAGNOSIS — N1831 Chronic kidney disease, stage 3a: Secondary | ICD-10-CM

## 2024-11-22 NOTE — Telephone Encounter (Unsigned)
 Copied from CRM [id number]. Topic: General - Call Back - No Documentation >> Nov 21, 2024  4:34 PM DeAngela L wrote: Reason for CRM: patient returning missed call from the office read message to the patient and also inform the patient the office left a detailed message   Patient states he is taking  81mg  asprin to help thin his blood after a heart attack Patient wants a call back   Patient num  [phone]

## 2024-11-26 ENCOUNTER — Other Ambulatory Visit: Payer: Self-pay | Admitting: Pulmonary Disease

## 2024-11-26 DIAGNOSIS — K219 Gastro-esophageal reflux disease without esophagitis: Secondary | ICD-10-CM

## 2025-02-11 ENCOUNTER — Ambulatory Visit: Payer: Self-pay | Admitting: Family

## 2025-06-17 ENCOUNTER — Ambulatory Visit: Payer: Self-pay
